# Patient Record
Sex: Female | Born: 1945
Health system: Southern US, Community
[De-identification: ages and names within clinical notes are randomized; demographics above are authoritative.]

## PROBLEM LIST (undated history)

## (undated) DIAGNOSIS — I10 Essential (primary) hypertension: Secondary | ICD-10-CM

## (undated) DIAGNOSIS — T7840XA Allergy, unspecified, initial encounter: Secondary | ICD-10-CM

## (undated) DIAGNOSIS — J449 Chronic obstructive pulmonary disease, unspecified: Secondary | ICD-10-CM

## (undated) DIAGNOSIS — R011 Cardiac murmur, unspecified: Secondary | ICD-10-CM

## (undated) HISTORY — DX: Chronic obstructive pulmonary disease, unspecified: J44.9

## (undated) HISTORY — DX: Cardiac murmur, unspecified: R01.1

## (undated) HISTORY — PX: UPPER GASTROINTESTINAL ENDOSCOPY: SHX188

## (undated) HISTORY — DX: Allergy, unspecified, initial encounter: T78.40XA

---

## 2000-06-10 ENCOUNTER — Emergency Department (HOSPITAL_COMMUNITY): Admission: EM | Admit: 2000-06-10 | Discharge: 2000-06-10 | Payer: Self-pay | Admitting: Emergency Medicine

## 2002-03-05 ENCOUNTER — Emergency Department (HOSPITAL_COMMUNITY): Admission: EM | Admit: 2002-03-05 | Discharge: 2002-03-05 | Payer: Self-pay | Admitting: Emergency Medicine

## 2005-07-09 ENCOUNTER — Emergency Department (HOSPITAL_COMMUNITY): Admission: EM | Admit: 2005-07-09 | Discharge: 2005-07-09 | Payer: Self-pay | Admitting: Emergency Medicine

## 2005-10-29 ENCOUNTER — Ambulatory Visit: Payer: Self-pay | Admitting: *Deleted

## 2005-10-29 ENCOUNTER — Ambulatory Visit: Payer: Self-pay | Admitting: Family Medicine

## 2011-04-19 ENCOUNTER — Encounter: Payer: Self-pay | Admitting: *Deleted

## 2011-04-19 ENCOUNTER — Emergency Department (HOSPITAL_COMMUNITY)
Admission: EM | Admit: 2011-04-19 | Discharge: 2011-04-19 | Disposition: A | Discharge status: Home / Self Care | Payer: Self-pay | Attending: Emergency Medicine | Admitting: Emergency Medicine

## 2011-04-19 ENCOUNTER — Emergency Department (HOSPITAL_COMMUNITY): Payer: Self-pay

## 2011-04-19 DIAGNOSIS — R059 Cough, unspecified: Secondary | ICD-10-CM | POA: Insufficient documentation

## 2011-04-19 DIAGNOSIS — IMO0001 Reserved for inherently not codable concepts without codable children: Secondary | ICD-10-CM | POA: Insufficient documentation

## 2011-04-19 DIAGNOSIS — B9789 Other viral agents as the cause of diseases classified elsewhere: Secondary | ICD-10-CM | POA: Insufficient documentation

## 2011-04-19 DIAGNOSIS — R05 Cough: Secondary | ICD-10-CM | POA: Insufficient documentation

## 2011-04-19 DIAGNOSIS — R220 Localized swelling, mass and lump, head: Secondary | ICD-10-CM | POA: Insufficient documentation

## 2011-04-19 DIAGNOSIS — J3489 Other specified disorders of nose and nasal sinuses: Secondary | ICD-10-CM | POA: Insufficient documentation

## 2011-04-19 DIAGNOSIS — J988 Other specified respiratory disorders: Secondary | ICD-10-CM

## 2011-04-19 DIAGNOSIS — R093 Abnormal sputum: Secondary | ICD-10-CM | POA: Insufficient documentation

## 2011-04-19 HISTORY — DX: Essential (primary) hypertension: I10

## 2011-04-19 MED ORDER — GUAIFENESIN 100 MG/5ML PO LIQD: 100.0000 mg | ORAL | Status: AC | PRN

## 2011-04-19 MED ORDER — SODIUM CHLORIDE 0.9 % IV BOLUS (SEPSIS)
1000.0000 mL | Freq: Once | INTRAVENOUS | Status: AC
  Administered 2011-04-19: 1000 mL via INTRAVENOUS

## 2011-04-19 MED ORDER — ONDANSETRON HCL 4 MG PO TABS: 4.0000 mg | ORAL_TABLET | Freq: Three times a day (TID) | ORAL | Status: AC | PRN

## 2011-04-19 MED ORDER — ONDANSETRON HCL 4 MG/2ML IJ SOLN
4.0000 mg | Freq: Once | INTRAMUSCULAR | Status: AC
  Administered 2011-04-19: 4 mg via INTRAVENOUS
  Filled 2011-04-19: qty 2

## 2011-04-19 MED ORDER — ACETAMINOPHEN 325 MG PO TABS
650.0000 mg | ORAL_TABLET | Freq: Once | ORAL | Status: AC
  Administered 2011-04-19: 650 mg via ORAL
  Filled 2011-04-19: qty 2

## 2011-04-19 NOTE — ED Notes (Signed)
Pt reports cold like symptoms x 2 days.  Coughing up yellow sputum.  Denies sore throat.  Denies fever.  Ambulatory.  No distress noted.

## 2011-04-19 NOTE — ED Provider Notes (Signed)
History     CSN: [id number]  Arrival date & time 04/19/11  6578   First MD Initiated Contact with Patient 04/19/11 [phone]      Chief Complaint  Patient presents with  . Nasal Congestion  . Cough    (Consider location/radiation/quality/duration/timing/severity/associated sxs/prior treatment) HPI Comments: Patient reports cough productive of yellow sputum, nasal congestion, body aches, nausea, decreased appetite that began two days ago.  Pt states she has not been eating but did drink a little bit yesterday.  Notes mild SOB.  Has been taking OTC cold medication with some relief.  Denies fevers, sore throat, abdominal or chest pain.   Patient is a 65 y.o. female presenting with cough. The history is provided by the patient.  Cough The current episode started 2 days ago. There has been no fever. Associated symptoms include myalgias. Pertinent negatives include no chest pain and no sore throat. She is a smoker.    Past Medical History  Diagnosis Date  . Hypertension     History reviewed. No pertinent past surgical history.  History reviewed. No pertinent family history.  History  Substance Use Topics  . Smoking status: Current Everyday Smoker -- 1.0 packs/day for 40 years  . Smokeless tobacco: Not on file  . Alcohol Use: No    OB History    Grav Para Term Preterm Abortions TAB SAB Ect Mult Living                  Review of Systems  HENT: Negative for sore throat.   Respiratory: Positive for cough.   Cardiovascular: Negative for chest pain.  Musculoskeletal: Positive for myalgias.  All other systems reviewed and are negative.    Allergies  Review of patient's allergies indicates no known allergies.  Home Medications  No current outpatient prescriptions on file.  BP 147/104  Pulse 118  Temp(Src) 97.7 F (36.5 C) (Oral)  Resp 20  SpO2 94%  Physical Exam  Nursing note and vitals reviewed. Constitutional: She is oriented to person, place, and time. She  appears well-developed and well-nourished.  HENT:  Head: Normocephalic and atraumatic.  Nose: Mucosal edema and rhinorrhea present.  Mouth/Throat: Uvula is midline and mucous membranes are normal. Posterior oropharyngeal erythema present. No oropharyngeal exudate, posterior oropharyngeal edema or tonsillar abscesses.  Neck: Neck supple.  Cardiovascular: Normal rate, regular rhythm and normal heart sounds.   Pulmonary/Chest: Effort normal and breath sounds normal. No respiratory distress. She has no wheezes. She has no rales. She exhibits no tenderness.  Abdominal: Soft. Bowel sounds are normal. She exhibits no distension. There is no tenderness. There is no rebound and no guarding.  Neurological: She is alert and oriented to person, place, and time.  Psychiatric: She has a normal mood and affect.    ED Course  Procedures (including critical care time)  7:50 AM Patient reports she is feeling better, states she is hungry and would like to be discharged so she can go to the cafeteria and eat.    Labs Reviewed - No data to display Dg Chest 2 View  04/19/2011  *RADIOLOGY REPORT*  Clinical Data: Cough  CHEST - 2 VIEW  Comparison: None.  Findings: Cardiomegaly.  Calcified tortuous aorta.  Increased perihilar and interstitial markings likely represent viral pneumonitis. Chronic lung disease not excluded however. No lobar consolidation.  No effusion or pneumothorax.  Osseous structures unremarkable.  IMPRESSION: Cardiomegaly.  Increased perihilar interstitial markings likely representing viral pneumonitis.  No lobar consolidation.  Original Report Authenticated By:  Elsie Stain, M.D.   Filed Vitals:   04/19/11 0738  BP: 159/106  Pulse: 72  Temp: 97.4 F (36.3 C)  Resp: 18      1. Viral respiratory illness       MDM  Patient with cough and body aches x 2 days, otherwise healthy, CXR normal.  Pt given IVF due to decreased PO intake.  Tachycardia resolved with IVF and tylenol.           Dillard Cannon [place], [place] 04/19/11 [phone]

## 2011-04-19 NOTE — ED Notes (Signed)
Patient transported to X-ray 

## 2011-04-20 NOTE — ED Provider Notes (Signed)
Medical screening examination/treatment/procedure(s) were performed by non-physician practitioner and as supervising physician I was immediately available for consultation/collaboration.  Jasmine Awe, MD 04/20/11 [phone]

## 2011-10-19 ENCOUNTER — Emergency Department (HOSPITAL_COMMUNITY)
Admission: EM | Admit: 2011-10-19 | Discharge: 2011-10-19 | Disposition: A | Discharge status: Home / Self Care | Payer: No Typology Code available for payment source | Attending: Emergency Medicine | Admitting: Emergency Medicine

## 2011-10-19 ENCOUNTER — Encounter (HOSPITAL_COMMUNITY): Payer: Self-pay | Admitting: Emergency Medicine

## 2011-10-19 DIAGNOSIS — F17200 Nicotine dependence, unspecified, uncomplicated: Secondary | ICD-10-CM | POA: Insufficient documentation

## 2011-10-19 DIAGNOSIS — Y92410 Unspecified street and highway as the place of occurrence of the external cause: Secondary | ICD-10-CM | POA: Insufficient documentation

## 2011-10-19 DIAGNOSIS — M25519 Pain in unspecified shoulder: Secondary | ICD-10-CM | POA: Insufficient documentation

## 2011-10-19 DIAGNOSIS — M79609 Pain in unspecified limb: Secondary | ICD-10-CM | POA: Insufficient documentation

## 2011-10-19 DIAGNOSIS — I10 Essential (primary) hypertension: Secondary | ICD-10-CM | POA: Insufficient documentation

## 2011-10-19 DIAGNOSIS — T148XXA Other injury of unspecified body region, initial encounter: Secondary | ICD-10-CM

## 2011-10-19 DIAGNOSIS — M542 Cervicalgia: Secondary | ICD-10-CM | POA: Insufficient documentation

## 2011-10-19 MED ORDER — METHOCARBAMOL 500 MG PO TABS: 500.0000 mg | ORAL_TABLET | Freq: Two times a day (BID) | ORAL | Status: DC

## 2011-10-19 MED ORDER — IBUPROFEN 800 MG PO TABS: 800.0000 mg | ORAL_TABLET | Freq: Three times a day (TID) | ORAL | Status: DC

## 2011-10-19 MED ORDER — HYDROCODONE-ACETAMINOPHEN 5-325 MG PO TABS: 1.0000 | ORAL_TABLET | Freq: Four times a day (QID) | ORAL | Status: DC | PRN

## 2011-10-19 NOTE — ED Notes (Signed)
Pt verbalizes understanding 
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
## 2011-10-19 NOTE — ED Notes (Signed)
Pt denies loc, denies injury to head
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
## 2011-10-21 NOTE — ED Provider Notes (Signed)
Medical screening examination/treatment/procedure(s) were performed by non-physician practitioner and as supervising physician I was immediately available for consultation/collaboration.  Flint Melter, MD 10/21/11 [phone]

## 2011-10-22 ENCOUNTER — Encounter (HOSPITAL_COMMUNITY): Payer: Self-pay

## 2011-10-22 ENCOUNTER — Emergency Department (HOSPITAL_COMMUNITY)
Admission: EM | Admit: 2011-10-22 | Discharge: 2011-10-22 | Disposition: A | Discharge status: Home / Self Care | Payer: No Typology Code available for payment source | Attending: Emergency Medicine | Admitting: Emergency Medicine

## 2011-10-22 DIAGNOSIS — F17200 Nicotine dependence, unspecified, uncomplicated: Secondary | ICD-10-CM | POA: Insufficient documentation

## 2011-10-22 DIAGNOSIS — I10 Essential (primary) hypertension: Secondary | ICD-10-CM

## 2011-10-22 LAB — BASIC METABOLIC PANEL WITH GFR
BUN: 9 mg/dL (ref 6–23)
CO2: 22 meq/L (ref 19–32)
Calcium: 9.6 mg/dL (ref 8.4–10.5)
Chloride: 102 meq/L (ref 96–112)
Creatinine, Ser: 0.73 mg/dL (ref 0.50–1.10)
GFR calc Af Amer: >90 mL/min
GFR calc non Af Amer: 89 mL/min — ABNORMAL LOW
Glucose, Bld: 97 mg/dL (ref 70–99)
Potassium: 4.1 meq/L (ref 3.5–5.1)
Sodium: 135 meq/L (ref 135–145)

## 2011-10-22 MED ORDER — CLONIDINE HCL 0.1 MG PO TABS
0.2000 mg | ORAL_TABLET | Freq: Once | ORAL | Status: AC
  Administered 2011-10-22: 0.2 mg via ORAL
  Filled 2011-10-22: qty 2

## 2011-10-22 MED ORDER — AMLODIPINE BESYLATE 10 MG PO TABS: 5.0000 mg | ORAL_TABLET | Freq: Every day | ORAL | Status: DC

## 2011-10-22 NOTE — ED Provider Notes (Addendum)
History     CSN: [id number]  Arrival date & time 10/22/11  1628   First MD Initiated Contact with Patient 10/22/11 2045      Chief Complaint  Patient presents with  . Hypertension    (Consider location/radiation/quality/duration/timing/severity/associated sxs/prior treatment) Patient is a 66 y.o. female presenting with hypertension. The history is provided by the patient.  Hypertension  pt here due to htn dx at the chiropractors office today--pt has been asymptomatic and has no prior h/o htn--denies chest pain, sob, abd pain, dizziness, or syncope--nothing makes her sx better or worse and no meds used pta  Past Medical History  Diagnosis Date  . Hypertension     History reviewed. No pertinent past surgical history.  History reviewed. No pertinent family history.  History  Substance Use Topics  . Smoking status: Current Everyday Smoker -- 1.0 packs/day for 40 years  . Smokeless tobacco: Not on file  . Alcohol Use: No     recovering etoh addict    OB History    Grav Para Term Preterm Abortions TAB SAB Ect Mult Living                  Review of Systems  All other systems reviewed and are negative.    Allergies  Review of patient's allergies indicates no known allergies.  Home Medications   Current Outpatient Rx  Name Route Sig Dispense Refill  . HYDROCODONE-ACETAMINOPHEN 5-325 MG PO TABS Oral Take 1-2 tablets by mouth every 6 (six) hours as needed.    . IBUPROFEN 800 MG PO TABS Oral Take 800 mg by mouth 3 (three) times daily.    Marland Kitchen GERITOL COMPLETE PO TABS Oral Take 1 tablet by mouth daily.    Marland Kitchen METHOCARBAMOL 500 MG PO TABS Oral Take 500 mg by mouth 2 (two) times daily.      BP 211/119  Pulse 76  Temp 98.2 F (36.8 C) (Oral)  Resp 16  SpO2 98%  Physical Exam  Nursing note and vitals reviewed. Constitutional: She is oriented to person, place, and time. She appears well-developed and well-nourished.  Non-toxic appearance. No distress.  HENT:  Head:  Normocephalic and atraumatic.  Eyes: Conjunctivae, EOM and lids are normal. Pupils are equal, round, and reactive to light.  Neck: Normal range of motion. Neck supple. No tracheal deviation present. No mass present.  Cardiovascular: Normal rate, regular rhythm and normal heart sounds.  Exam reveals no gallop.   No murmur heard. Pulmonary/Chest: Effort normal and breath sounds normal. No stridor. No respiratory distress. She has no decreased breath sounds. She has no wheezes. She has no rhonchi. She has no rales.  Abdominal: Soft. Normal appearance and bowel sounds are normal. She exhibits no distension. There is no tenderness. There is no rebound and no CVA tenderness.  Musculoskeletal: Normal range of motion. She exhibits no edema and no tenderness.  Neurological: She is alert and oriented to person, place, and time. She has normal strength. No cranial nerve deficit or sensory deficit. GCS eye subscore is 4. GCS verbal subscore is 5. GCS motor subscore is 6.  Skin: Skin is warm and dry. No abrasion and no rash noted.  Psychiatric: She has a normal mood and affect. Her speech is normal and behavior is normal.    ED Course  Procedures (including critical care time)  Labs Reviewed  BASIC METABOLIC PANEL - Abnormal; Notable for the following:    GFR calc non Af Amer 89 (*)  All other components within normal limits   No results found.   No diagnosis found.    MDM  Patient given clonidine 0.2 mg. Blood pressure was repeated and is now improved. Will place her on Norvasc and give her referrals      Date: 11/18/2011  Rate: 64  Rhythm: normal sinus rhythm  QRS Axis: normal  Intervals: normal  ST/T Wave abnormalities: nonspecific T wave changes  Conduction Disutrbances:none  Narrative Interpretation:   Old EKG Reviewed: none available     Toy Baker, MD 10/22/11 2243  Toy Baker, MD 11/18/11 [phone]
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
## 2011-10-22 NOTE — ED Notes (Signed)
Dr Allen notified about patient's blood pressure. 

## 2011-10-22 NOTE — Discharge Instructions (Signed)
Arterial Hypertension Arterial hypertension (high blood pressure) is a condition of elevated pressure in your blood vessels. Hypertension over a long period of time is a risk factor for strokes, heart attacks, and heart failure. It is also the leading cause of kidney (renal) failure.  CAUSES   In Adults -- Over 90% of all hypertension has no known cause. This is called essential or primary hypertension. In the other 10% of people with hypertension, the increase in blood pressure is caused by another disorder. This is called secondary hypertension. Important causes of secondary hypertension are:   Heavy alcohol use.   Obstructive sleep apnea.   Hyperaldosterosim (Conn's syndrome).   Steroid use.   Chronic kidney failure.   Hyperparathyroidism.   Medications.   Renal artery stenosis.   Pheochromocytoma.   Cushing's disease.   Coarctation of the aorta.   Scleroderma renal crisis.   Licorice (in excessive amounts).   Drugs (cocaine, methamphetamine).  Your caregiver can explain any items above that apply to you.  In Children -- Secondary hypertension is more common and should always be considered.   Pregnancy -- Few women of childbearing age have high blood pressure. However, up to 10% of them develop hypertension of pregnancy. Generally, this will not harm the woman. It Even be a sign of 3 complications of pregnancy: preeclampsia, HELLP syndrome, and eclampsia. Follow up and control with medication is necessary.  SYMPTOMS   This condition normally does not produce any noticeable symptoms. It is usually found during a routine exam.   Malignant hypertension is a late problem of high blood pressure. It Monger have the following symptoms:   Headaches.   Blurred vision.   End-organ damage (this means your kidneys, heart, lungs, and other organs are being damaged).   Stressful situations can increase the blood pressure. If a person with normal blood pressure has their blood  pressure go up while being seen by their caregiver, this is often termed "white coat hypertension." Its importance is not known. It Alkire be related with eventually developing hypertension or complications of hypertension.   Hypertension is often confused with mental tension, stress, and anxiety.  DIAGNOSIS  The diagnosis is made by 3 separate blood pressure measurements. They are taken at least 1 week apart from each other. If there is organ damage from hypertension, the diagnosis Lemire be made without repeat measurements. Hypertension is usually identified by having blood pressure readings:  Above 140/90 mmHg measured in both arms, at 3 separate times, over a couple weeks.   Over 130/80 mmHg should be considered a risk factor and Grisanti require treatment in patients with diabetes.  Blood pressure readings over 120/80 mmHg are called "pre-hypertension" even in non-diabetic patients. To get a true blood pressure measurement, use the following guidelines. Be aware of the factors that can alter blood pressure readings.  Take measurements at least 1 hour after caffeine.   Take measurements 30 minutes after smoking and without any stress. This is another reason to quit smoking - it raises your blood pressure.   Use a proper cuff size. Ask your caregiver if you are not sure about your cuff size.   Most home blood pressure cuffs are automatic. They will measure systolic and diastolic pressures. The systolic pressure is the pressure reading at the start of sounds. Diastolic pressure is the pressure at which the sounds disappear. If you are elderly, measure pressures in multiple postures. Try sitting, lying or standing.   Sit at rest for a minimum of   5 minutes before taking measurements.   You should not be on any medications like decongestants. These are found in many cold medications.   Record your blood pressure readings and review them with your caregiver.  If you have hypertension:  Your caregiver  may do tests to be sure you do not have secondary hypertension (see "causes" above).   Your caregiver may also look for signs of metabolic syndrome. This is also called Syndrome X or Insulin Resistance Syndrome. You may have this syndrome if you have type 2 diabetes, abdominal obesity, and abnormal blood lipids in addition to hypertension.   Your caregiver will take your medical and family history and perform a physical exam.   Diagnostic tests may include blood tests (for glucose, cholesterol, potassium, and kidney function), a urinalysis, or an EKG. Other tests may also be necessary depending on your condition.  PREVENTION  There are important lifestyle issues that you can adopt to reduce your chance of developing hypertension:  Maintain a normal weight.   Limit the amount of salt (sodium) in your diet.   Exercise often.   Limit alcohol intake.   Get enough potassium in your diet. Discuss specific advice with your caregiver.   Follow a DASH diet (dietary approaches to stop hypertension). This diet is rich in fruits, vegetables, and low-fat dairy products, and avoids certain fats.  PROGNOSIS  Essential hypertension cannot be cured. Lifestyle changes and medical treatment can lower blood pressure and reduce complications. The prognosis of secondary hypertension depends on the underlying cause. Many people whose hypertension is controlled with medicine or lifestyle changes can live a normal, healthy life.  RISKS AND COMPLICATIONS  While high blood pressure alone is not an illness, it often requires treatment due to its short- and long-term effects on many organs. Hypertension increases your risk for:  CVAs or strokes (cerebrovascular accident).   Heart failure due to chronically high blood pressure (hypertensive cardiomyopathy).   Heart attack (myocardial infarction).   Damage to the retina (hypertensive retinopathy).   Kidney failure (hypertensive nephropathy).  Your caregiver can  explain list items above that apply to you. Treatment of hypertension can significantly reduce the risk of complications. TREATMENT   For overweight patients, weight loss and regular exercise are recommended. Physical fitness lowers blood pressure.   Mild hypertension is usually treated with diet and exercise. A diet rich in fruits and vegetables, fat-free dairy products, and foods low in fat and salt (sodium) can help lower blood pressure. Decreasing salt intake decreases blood pressure in a 1/3 of people.   Stop smoking if you are a smoker.  The steps above are highly effective in reducing blood pressure. While these actions are easy to suggest, they are difficult to achieve. Most patients with moderate or severe hypertension end up requiring medications to bring their blood pressure down to a normal level. There are several classes of medications for treatment. Blood pressure pills (antihypertensives) will lower blood pressure by their different actions. Lowering the blood pressure by 10 mmHg may decrease the risk of complications by as much as 25%. The goal of treatment is effective blood pressure control. This will reduce your risk for complications. Your caregiver will help you determine the best treatment for you according to your lifestyle. What is excellent treatment for one person, may not be for you. HOME CARE INSTRUCTIONS   Do not smoke.   Follow the lifestyle changes outlined in the "Prevention" section.   If you are on medications, follow the directions   carefully. Blood pressure medications must be taken as prescribed. Skipping doses reduces their benefit. It also puts you at risk for problems.   Follow up with your caregiver, as directed.   If you are asked to monitor your blood pressure at home, follow the guidelines in the "Diagnosis" section above.  SEEK MEDICAL CARE IF:   You think you are having medication side effects.   You have recurrent headaches or lightheadedness.     You have swelling in your ankles.   You have trouble with your vision.  SEEK IMMEDIATE MEDICAL CARE IF:   You have sudden onset of chest pain or pressure, difficulty breathing, or other symptoms of a heart attack.   You have a severe headache.   You have symptoms of a stroke (such as sudden weakness, difficulty speaking, difficulty walking).  MAKE SURE YOU:   Understand these instructions.   Will watch your condition.   Will get help right away if you are not doing well or get worse.  Document Released: 04/15/2005 Document Revised: 04/04/2011 Document Reviewed: 11/13/2006 [facility] Patient Information 2012 [place], [place].RESOURCE GUIDE  Chronic Pain Problems: Contact [facility]  [phone] Patients need to be referred by their primary care doctor.  Insufficient Money for Medicine: Contact United Way:  call "211" or [facility] [phone].  No Primary Care Doctor: - Call Health Connect  [phone] - can help you locate a primary care doctor that  accepts your insurance, provides certain services, etc. - Physician Referral [phone]  Agencies that provide inexpensive medical care: - [facility]  [phone] - [facility]  [phone] - [facility]  [phone] [facility]  [phone] - Planned Parenthood  [phone] [facility]  [phone]  Medicaid-accepting [facility] Providers: - [facility]- [address]  [phone], Mon-Fri 9am-7pm, Sat 9am-1pm - [facility]- [address] [place], [address] [place]  [phone] - [facility]- [address] [place]  [phone] [facility]- [address]  [phone] - Renaye Rakers- [address] [place], [address], [phone]  Only accepts [place] patients after they have their name  applied to their card  Self Pay (no insurance) in  [place]: - Sickle Cell Patients: Dr Willey Blade, [facility]  [address] [place], [phone] - [facility]- [address] [place]  [phone]       [facility] [place]- [address]       -     [facility]- see information above (Speak to [employer] if you do not have insurance)       -  [facility]- [address] [place], [phone]       -  [facility]- [address],  [phone]       -  [facility]- [address], [phone]       -  Dr Julio Sicks-  [address], [address], [place], [phone]       -  [facility]- [address] [phone]       -  [facility]- [address], [phone], also [address] [phone]       -    [facility]- [phone]  [address], [phone], 1st & 3rd Saturday   every month, 10am-1pm  1) Find a Doctor and Pay Out of Pocket Although you won't have to find out who is covered by your insurance plan, it is a good idea to ask around and get recommendations. You will then need to call the office and see if the doctor you have chosen will accept you as a new patient and what types of options they offer for patients who are self-pay. Some doctors offer discounts or will set up payment plans for their patients who do not have insurance, but you will need to ask so you aren't surprised when you get to your appointment.  2) Contact Your Local Health Department Not all health departments have doctors that can see patients for sick visits, but many do, so it is worth a call to see if yours does. If you don't know where your local health department is, you can check in your phone book. The CDC also has a tool to help you locate your state's health department, and many state websites also have listings of all of their local health departments.  3) Find a Walk-in Clinic If your illness is not likely to be very severe or  complicated, you may want to try a walk in clinic. These are popping up all over the country in pharmacies, drugstores, and shopping centers. They're usually staffed by nurse practitioners or physician assistants that have been trained to treat common illnesses and complaints. They're usually fairly quick and inexpensive. However, if you have serious medical issues or chronic medical problems, these are probably not your best option  STD Testing - [facility] Department of [place], STD Clinic, [address] [place], phone [phone] or [phone].  Monday - Friday, call for an appointment. [facility] Department of [employer], STD Clinic, [address], [place], phone [phone] or [phone].  Monday - Friday, call for an appointment.  Abuse/Neglect: [facility] Child Abuse Hotline 425-727-6341 [facility] Child Abuse Hotline (320) 532-9702 (After Hours)  Emergency Shelter:  [facility] (757)644-0374  Maternity Homes: - Room at the [place] [facility] [phone] - [facility] [phone]  MRSA Hotline #:   351-325-4539  [facility] Resources  [facility] [place]  United Way [facility] Dept. [address]                 [address]         [address] [place] [address]  Blondell Reveal Phone:  [phone]                                  Phone:  [phone]                   Phone:  [phone]  [facility], [phone] - [facility] - [phone]       -  [facility] in [place], [address]                                  (747)019-3609, [facility] Child Abuse Hotline 626-045-7552 or [phone] (After Hours)   Behavioral Health Services  Substance  Abuse Resources: - Alcohol and Drug Services  ([phone] - [facility] 747-034-4019 - [facility] ([phone] Floydene Flock [phone] - Residential & Outpatient Substance Abuse Program  [phone]  Psychological Services: [facility]  [phone] [facility]  [phone] - [employer], [phone] [place]. [address] [place], ACCESS LINE: 765-456-1176 or (430)[phone], [employer]  Dental Assistance  If unable to pay or uninsured, contact:  [facility] or [facility]. to become qualified for the adult dental clinic.  Patients with Medicaid: [facility] [id number] [address], [phone] [address] [phone]  If unable to pay, or uninsured, contact [facility] [phone]) or [facility] Department [phone] in [place], [phone] in [facility]) to become qualified for the adult dental clinic  Other Low-Cost Community Dental Services: - [facility]- [address] [place], [place], [place], [address], [phone], [address]. [address], 2nd and 4th Thursday of the month at 6:30am.  10 clients each day by appointment, can sometimes see walk-in patients if someone does not show for an appointment. [facility]- [address] [place], [place], [address], [phone] - [facility]- [address], [place], [place], [address], [phone] - [facility]- [phone] [facility] Department- [phone] [facility] Department- [phone]

## 2012-03-13 ENCOUNTER — Encounter (HOSPITAL_COMMUNITY): Payer: Self-pay | Admitting: *Deleted

## 2012-03-13 ENCOUNTER — Emergency Department (HOSPITAL_COMMUNITY)
Admission: EM | Admit: 2012-03-13 | Discharge: 2012-03-13 | Disposition: A | Discharge status: Home / Self Care | Payer: No Typology Code available for payment source | Attending: Emergency Medicine | Admitting: Emergency Medicine

## 2012-03-13 DIAGNOSIS — Z79899 Other long term (current) drug therapy: Secondary | ICD-10-CM | POA: Insufficient documentation

## 2012-03-13 DIAGNOSIS — Z87828 Personal history of other (healed) physical injury and trauma: Secondary | ICD-10-CM | POA: Insufficient documentation

## 2012-03-13 DIAGNOSIS — I10 Essential (primary) hypertension: Secondary | ICD-10-CM | POA: Insufficient documentation

## 2012-03-13 DIAGNOSIS — F17200 Nicotine dependence, unspecified, uncomplicated: Secondary | ICD-10-CM | POA: Insufficient documentation

## 2012-03-13 DIAGNOSIS — R51 Headache: Secondary | ICD-10-CM | POA: Insufficient documentation

## 2012-03-13 MED ORDER — AMLODIPINE BESYLATE 10 MG PO TABS: 5.0000 mg | ORAL_TABLET | Freq: Every day | ORAL | Status: DC

## 2012-03-13 NOTE — ED Provider Notes (Signed)
I saw and evaluated the patient, reviewed the resident's note and I agree with the findings and plan.    No HA now, BP is high, but no evidence of end organ failure.  Needs PCP follow up and continued management as outpt.  Pt is counseled, started back on norvasc which is what she was put on by EDP in June.  Pt is agreeable.     Gavin Pound. Oletta Lamas, MD 03/13/12 2153

## 2012-03-13 NOTE — ED Notes (Signed)
Pt sts her bp is high and her head aches

## 2012-03-13 NOTE — ED Notes (Signed)
Patient states that B/P was high at home. At this time is stating that she does not have a headache, NAD
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
## 2013-11-17 ENCOUNTER — Emergency Department (INDEPENDENT_AMBULATORY_CARE_PROVIDER_SITE_OTHER)
Admission: EM | Admit: 2013-11-17 | Discharge: 2013-11-17 | Disposition: A | Discharge status: Home / Self Care | Payer: Medicare Other | Source: Home / Self Care | Attending: Family Medicine | Admitting: Family Medicine

## 2013-11-17 ENCOUNTER — Encounter (HOSPITAL_COMMUNITY): Payer: Self-pay | Admitting: Emergency Medicine

## 2013-11-17 DIAGNOSIS — I10 Essential (primary) hypertension: Secondary | ICD-10-CM | POA: Diagnosis not present

## 2013-11-17 LAB — POCT I-STAT, CHEM 8
BUN: 9 mg/dL (ref 6–23)
CALCIUM ION: 1.19 mmol/L (ref 1.13–1.30)
Chloride: 105 mEq/L (ref 96–112)
Creatinine, Ser: 1 mg/dL (ref 0.50–1.10)
GLUCOSE: 107 mg/dL — AB (ref 70–99)
HEMATOCRIT: 39 % (ref 36.0–46.0)
HEMOGLOBIN: 13.3 g/dL (ref 12.0–15.0)
Potassium: 3.1 mEq/L — ABNORMAL LOW (ref 3.7–5.3)
Sodium: 141 mEq/L (ref 137–147)
TCO2: 26 mmol/L (ref 0–100)

## 2013-11-17 MED ORDER — AMLODIPINE BESYLATE 10 MG PO TABS: 10.0000 mg | ORAL_TABLET | Freq: Every day | ORAL | Status: DC

## 2013-11-17 NOTE — Discharge Instructions (Signed)
Thank you for coming in today. Take amlodipine daily.  Follow up with a doctor ASAP.  Call or go to the emergency room if you get worse, have trouble breathing, have chest pains, or palpitations.   PRIMARY CARE Paramedic at [place], [place] [phone]  Fax [phone]  Therapist, music at [facility] [address] [place], [place] [phone]  Fax [phone]  Therapist, music at [place] / [place] [phone], [place] [phone]  Fax [phone]  [facility] at [facility] [address], [place]  [place], [place] [phone]  Fax [phone]  [place] [address] [place] [address] [place], [place] [phone]  Fax [phone]  [facility] at [facility] [place], [place] [phone]  Fax [phone]   [place] @ [place] [address] Phone: [phone]   [place] @ [facility]. [place] [address] Phone: [place] @ [place] [address] [place] [address] Phone: [place] @ [place]. [place] [address] Phone: [place] @ [place]. [place], [place] [address] Phone: [phone]   [place] @ [place] [address] [place] [address] Phone: [phone]   Hypertension Hypertension, commonly called high blood pressure, is when the force of blood pumping through your arteries is too strong. Your arteries are the blood vessels that carry blood from your heart throughout your body. A blood pressure reading consists of a higher number over  a lower number, such as 110/72. The higher number (systolic) is the pressure inside your arteries when your heart pumps. The lower number (diastolic) is the pressure inside your arteries when your heart relaxes. Ideally you want your blood pressure below 120/80. Hypertension forces your heart to work harder to pump blood. Your arteries may become narrow or stiff. Having hypertension puts you at risk for heart disease, stroke, and other problems.  RISK FACTORS Some risk factors for high blood pressure are controllable. Others are not.  Risk factors you cannot control include:   Race. You may be at higher risk if you are African American.  Age. Risk increases with age.  Gender. Men are at higher risk than women before age 41 years. After age 65, women are at higher risk than men. Risk factors you can control include:  Not getting enough exercise or physical activity.  Being overweight.  Getting too much fat, sugar, calories, or salt in your diet.  Drinking too much alcohol. SIGNS AND SYMPTOMS Hypertension does not usually cause signs or symptoms. Extremely high blood pressure (hypertensive crisis) may cause headache, anxiety, shortness of breath, and nosebleed. DIAGNOSIS  To check if you have hypertension, your health care provider will measure your blood pressure while you are seated, with your arm held at the level of your heart. It should be measured at least twice using the same arm. Certain conditions can cause a difference in blood pressure between your right and left arms. A blood pressure reading that is higher than normal on one occasion does not mean that you need treatment.  If one blood pressure reading is high, ask your health care provider about having it checked again. TREATMENT  Treating high blood pressure includes making lifestyle changes and possibly taking medication. Living a healthy lifestyle can help lower high blood pressure. You may need to change some of your  habits. Lifestyle changes may include:  Following the DASH diet. This diet is high in fruits, vegetables, and whole grains. It is low in salt, red meat, and added sugars.  Getting at least 2 1/2 hours of brisk physical activity every week.  Losing weight if necessary.  Not smoking.  Limiting alcoholic beverages.  Learning ways to reduce stress. If lifestyle changes are not enough to get your blood pressure under control, your health care provider may prescribe medicine. You may need to take more than one. Work closely with your health care provider to understand the risks and benefits. HOME CARE INSTRUCTIONS  Have your blood pressure rechecked as directed by your health care provider.   Only take medicine as directed by your health care provider. Follow the directions carefully. Blood pressure medicines must be taken as prescribed. The medicine does not work as well when you skip doses. Skipping doses also puts you at risk for problems.   Do not smoke.   Monitor your blood pressure at home as directed by your health care provider. SEEK MEDICAL CARE IF:   You think you are having a reaction to medicines taken.  You have recurrent headaches or feel dizzy.  You have swelling in your ankles.  You have trouble with your vision. SEEK IMMEDIATE MEDICAL CARE IF:  You develop a severe headache or confusion.  You have unusual weakness, numbness, or feel faint.  You have severe chest or abdominal pain.  You vomit repeatedly.  You have trouble breathing. MAKE SURE YOU:   Understand these instructions.  Will watch your condition.  Will get help right away if you are not doing well or get worse. Document Released: 04/15/2005 Document Revised: 04/20/2013 Document Reviewed: 02/05/2013 [facility] Patient Information 2015 [place], [place]. This information is not intended to replace advice given to you by your health care provider. Make sure you discuss any questions you have with  your health care provider.

## 2013-11-17 NOTE — ED Provider Notes (Signed)
Virginia Watson is a 68 y.o. female who presents to Urgent Care today for hypertension. Patient has been out of her blood pressure medication for the last 4-5 months. She feels well and would like to restart her medication. She notes she's had several deaths in the family recently and is very stressed out. Multiple family members have told her that she should restart her blood pressure medication. She feels well otherwise. No chest pains palpitations or shortness of breath. Patient previously was tolerating amlodipine daily.   Past Medical History  Diagnosis Date  . Hypertension    History  Substance Use Topics  . Smoking status: Current Every Day Smoker -- 1.00 packs/day for 40 years  . Smokeless tobacco: Not on file  . Alcohol Use: No     Comment: recovering etoh addict   ROS as above Medications: No current facility-administered medications for this encounter.   Current Outpatient Prescriptions  Medication Sig Dispense Refill  . amLODipine (NORVASC) 10 MG tablet Take 1 tablet (10 mg total) by mouth daily.  30 tablet  0  . Multiple Vitamin (MULTIVITAMIN WITH MINERALS) TABS Take 1 tablet by mouth daily. Geritol Complete      . OVER THE COUNTER MEDICATION Place 1 drop into both eyes daily as needed. Bausch & lomb eye drops for allergies        Exam:  BP 187/109  Pulse 78  Temp(Src) 98.1 F (36.7 C) (Oral)  Resp 14  SpO2 97% Gen: Well NAD HEENT: EOMI,  MMM Lungs: Normal work of breathing. CTABL Heart: RRR no MRG Abd: NABS, Soft. Nondistended, Nontender Exts: Brisk capillary refill, warm and well perfused.   Results for orders placed during the hospital encounter of 11/17/13 (from the past 24 hour(s))  POCT I-STAT, CHEM 8     Status: Abnormal   Collection Time    11/17/13  1:44 PM      Result Value Ref Range   Sodium 141  137 - 147 mEq/L   Potassium 3.1 (*) 3.7 - 5.3 mEq/L   Chloride 105  96 - 112 mEq/L   BUN 9  6 - 23 mg/dL   Creatinine, Ser 1.00  0.50 - 1.10 mg/dL   Glucose, Bld 107 (*) 70 - 99 mg/dL   Calcium, Ion 1.19  1.13 - 1.30 mmol/L   TCO2 26  0 - 100 mmol/L   Hemoglobin 13.3  12.0 - 15.0 g/dL   HCT 39.0  36.0 - 46.0 %   No results found.  Assessment and Plan: 68 y.o. female with hypertension. Restart amlodipine. Followup with primary care provider.  Discussed warning signs or symptoms. Please see discharge instructions. Patient expresses understanding.   This note was created using Systems analyst. Any transcription errors are unintended.    Gregor Hams, MD 11/17/13 [phone]

## 2013-11-17 NOTE — ED Notes (Signed)
C/o blood pressure is high States she has been out of meds for 4-5 months No pcp

## 2016-06-05 ENCOUNTER — Encounter (HOSPITAL_COMMUNITY): Payer: Self-pay

## 2016-06-05 ENCOUNTER — Emergency Department (HOSPITAL_COMMUNITY)
Admission: EM | Admit: 2016-06-05 | Discharge: 2016-06-05 | Disposition: A | Discharge status: Home / Self Care | Payer: Commercial Managed Care - HMO | Attending: Emergency Medicine | Admitting: Emergency Medicine

## 2016-06-05 ENCOUNTER — Emergency Department (HOSPITAL_COMMUNITY): Payer: Commercial Managed Care - HMO

## 2016-06-05 DIAGNOSIS — Z79899 Other long term (current) drug therapy: Secondary | ICD-10-CM | POA: Insufficient documentation

## 2016-06-05 DIAGNOSIS — J441 Chronic obstructive pulmonary disease with (acute) exacerbation: Secondary | ICD-10-CM | POA: Diagnosis not present

## 2016-06-05 DIAGNOSIS — R05 Cough: Secondary | ICD-10-CM | POA: Diagnosis present

## 2016-06-05 DIAGNOSIS — F17200 Nicotine dependence, unspecified, uncomplicated: Secondary | ICD-10-CM | POA: Insufficient documentation

## 2016-06-05 DIAGNOSIS — Z77120 Contact with and (suspected) exposure to mold (toxic): Secondary | ICD-10-CM | POA: Diagnosis not present

## 2016-06-05 DIAGNOSIS — I10 Essential (primary) hypertension: Secondary | ICD-10-CM | POA: Insufficient documentation

## 2016-06-05 MED ORDER — AEROCHAMBER PLUS FLO-VU MEDIUM MISC
1.0000 | Freq: Once | Status: AC
  Administered 2016-06-05: 1

## 2016-06-05 MED ORDER — PREDNISONE 20 MG PO TABS: 60.0000 mg | ORAL_TABLET | Freq: Every day | ORAL | 0 refills | Status: AC

## 2016-06-05 MED ORDER — ALBUTEROL SULFATE HFA 108 (90 BASE) MCG/ACT IN AERS
4.0000 | INHALATION_SPRAY | Freq: Once | RESPIRATORY_TRACT | Status: AC
  Administered 2016-06-05: 4 via RESPIRATORY_TRACT
  Filled 2016-06-05: qty 6.7

## 2016-06-05 MED ORDER — AZITHROMYCIN 250 MG PO TABS: 250.0000 mg | ORAL_TABLET | Freq: Every day | ORAL | 0 refills | Status: DC

## 2016-06-05 MED ORDER — PREDNISONE 20 MG PO TABS
60.0000 mg | ORAL_TABLET | Freq: Once | ORAL | Status: AC
  Administered 2016-06-05: 60 mg via ORAL
  Filled 2016-06-05: qty 3

## 2016-06-05 NOTE — Discharge Instructions (Signed)
Try to decrease the amount of Sudafed you are taking, as it is not good for your blood pressure  Try Corcidin HBP (High Blood Pressure) for your symptoms

## 2016-06-05 NOTE — ED Triage Notes (Signed)
Pt endorses "I think there is mold in my house and I have been exposed to it. I physically removed myself from the house" Pt endorses coughing x 2-3 weeks. Pt hypertensive in triage and does not take BP meds.

## 2016-06-05 NOTE — ED Provider Notes (Signed)
[facility] DEPT Provider Note   CSN: [id number] Arrival date & time: 06/05/16  [id number]   By signing my name below, I, [name], attest that this documentation has been prepared under the direction and in the presence of Duffy Bruce, MD. Electronically Signed: [name], ED Scribe. 06/05/16. 12:53 PM.  History   Chief Complaint Chief Complaint  Patient presents with  . Allergies    HPI Virginia Watson is a 71 y.o. female with a PMHx of HTN, who presents to the Emergency Department complaining of allergies onset 2-3 weeks ago. Pt reports associated productive cough x white sputum for the past 2-3 weeks. Pt hasn't tried any medications for the relief of her symptoms. Pt states that she is concerned for probable exposure to mold due to being informed by her landlord that there was a possibility of mold in her apartment. Pt reports that she has never been exposed to mold in the past. She denies SOB, CP, fever, nausea, vomiting, lip swelling, difficulty swallowing, and any other symptoms. Pt states that she smokes 1 PPD of cigarettes. Pt denies seeing a pulmonologist or using an albuterol inhaler.   The history is provided by the patient. No language interpreter was used.    Past Medical History:  Diagnosis Date  . Hypertension     There are no active problems to display for this patient.   History reviewed. No pertinent surgical history.  OB History    No data available       Home Medications    Prior to Admission medications   Medication Sig Start Date End Date Taking? Authorizing Provider  amLODipine (NORVASC) 10 MG tablet Take 1 tablet (10 mg total) by mouth daily. 11/17/13   Gregor Hams, MD  azithromycin (ZITHROMAX) 250 MG tablet Take 1 tablet (250 mg total) by mouth daily. Take first 2 tablets together, then 1 every day until finished. 06/05/16   Duffy Bruce, MD  Multiple Vitamin (MULTIVITAMIN WITH MINERALS) TABS Take 1 tablet by mouth daily. Geritol Complete     Historical Provider, MD  OVER THE COUNTER MEDICATION Place 1 drop into both eyes daily as needed. Bausch & lomb eye drops for allergies    Historical Provider, MD  predniSONE (DELTASONE) 20 MG tablet Take 3 tablets (60 mg total) by mouth daily. 06/05/16 06/10/16  Duffy Bruce, MD    Family History History reviewed. No pertinent family history.  Social History Social History  Substance Use Topics  . Smoking status: Current Every Day Smoker    Packs/day: 0.50    Years: 40.00  . Smokeless tobacco: Never Used  . Alcohol use Yes     Comment: recovering etoh addict     Allergies   Patient has no known allergies.   Review of Systems Review of Systems  Constitutional: Positive for fatigue. Negative for chills and fever.  HENT: Negative for congestion, rhinorrhea and sore throat.   Eyes: Negative for visual disturbance.  Respiratory: Positive for cough (productive) and wheezing. Negative for shortness of breath.   Cardiovascular: Negative for chest pain and leg swelling.  Gastrointestinal: Negative for abdominal pain, diarrhea, nausea and vomiting.  Genitourinary: Negative for dysuria, flank pain, vaginal bleeding and vaginal discharge.  Musculoskeletal: Negative for neck pain.  Skin: Negative for rash.  Allergic/Immunologic: Negative for immunocompromised state.  Neurological: Negative for syncope and headaches.  Hematological: Does not bruise/bleed easily.  All other systems reviewed and are negative.    Physical Exam Updated Vital Signs BP (!) 217/110 (BP  Location: Left Arm) Comment: took herself off her bp meds  Pulse 88   Temp 98.7 F (37.1 C) (Oral)   Resp 18   Ht 5' 6.5" (1.689 m)   Wt 140 lb (63.5 kg)   SpO2 95%   BMI 22.26 kg/m   Physical Exam  Constitutional: She is oriented to person, place, and time. She appears well-developed and well-nourished. No distress.  HENT:  Head: Normocephalic and atraumatic.  Eyes: Conjunctivae are normal.  Neck: Neck supple.    Cardiovascular: Normal rate and regular rhythm.   Murmur heard.  Systolic murmur is present with a grade of 1/6  1/6 systolic murmur over right upper sternal border  Pulmonary/Chest: Effort normal. No respiratory distress. She has wheezes.  Nl work of breathing. Mild expiratory wheezes.   Abdominal: She exhibits no distension.  Musculoskeletal: She exhibits no edema.  Neurological: She is alert and oriented to person, place, and time. She exhibits normal muscle tone.  Skin: Skin is warm. Capillary refill takes less than 2 seconds. No rash noted.  Nursing note and vitals reviewed.    ED Treatments / Results  DIAGNOSTIC STUDIES: Oxygen Saturation is 95% on RA, adequate by my interpretation.    COORDINATION OF CARE: 12:50 PM Discussed treatment plan with pt at bedside which includes CXR and pt agreed to plan.  Radiology Dg Chest 2 View  Result Date: 06/05/2016 CLINICAL DATA:  Productive cough.  Wheezing. EXAM: CHEST  2 VIEW COMPARISON:  04/19/2011 FINDINGS: Heart size and pulmonary vascularity are normal. There is tortuosity and calcification of the thoracic aorta. There is slight chronic accentuation of the interstitial markings at both lung bases. No effusions. No bone abnormality. IMPRESSION: Slight chronic interstitial disease at the lung bases. Aortic atherosclerosis. Electronically Signed   By: Lorriane Shire M.D.   On: 06/05/2016 13:58    Procedures Procedures (including critical care time)  Medications Ordered in ED Medications  predniSONE (DELTASONE) tablet 60 mg (60 mg Oral Given 06/05/16 1401)  albuterol (PROVENTIL HFA;VENTOLIN HFA) 108 (90 Base) MCG/ACT inhaler 4 puff (4 puffs Inhalation Given 06/05/16 1401)  AEROCHAMBER PLUS FLO-VU MEDIUM MISC 1 each (1 each Other Given 06/05/16 1402)     Initial Impression / Assessment and Plan / ED Course  I have reviewed the triage vital signs and the nursing notes.  Pertinent imaging results that were available during my care of the  patient were reviewed by me and considered in my medical decision making (see chart for details).    71 yo F with PMHx COPD, smoking here with persistent cough, sputum production, possibly due to mold exposure. No fevers. On arrival, VSS. Exam as above, with diffuse wheezing, but normal WOB and oxygenation. CXR is clear. Suspect mild allergic pulmonitis versus COPD exacerbation. No evidence to suggest invasive aspergillosis. Pt is not diabetic or immune suppressed. Do not suspect PE. No CP, SOB, or signs of ACS or CHF.   Of note, pt also markedly HTN. She has been taking Sudafed for allergic sx. Will advise pt to stop, take Corcidin HBP. Will refer for outpt follow-up. No HA, vision changes, CP, or signs of ACS or HTn urgency or emergency. Will treat with prednisone, azithro, and outpt f/u. Return precautions given.   Final Clinical Impressions(s) / ED Diagnoses   Final diagnoses:  COPD exacerbation (HCC)  Mold exposure    New Prescriptions New Prescriptions   AZITHROMYCIN (ZITHROMAX) 250 MG TABLET    Take 1 tablet (250 mg total) by mouth daily. Take  first 2 tablets together, then 1 every day until finished.   PREDNISONE (DELTASONE) 20 MG TABLET    Take 3 tablets (60 mg total) by mouth daily.     I personally performed the services described in this documentation, which was scribed in my presence. The recorded information has been reviewed and is accurate.     Duffy Bruce, MD 06/05/16 1409

## 2016-06-05 NOTE — ED Notes (Signed)
edp aware of vital signs and says okay to discharge because the patient is asymptomatic and needs to quit taken sudafed, thorough explanation given to patient on stopping sudafed

## 2017-01-10 ENCOUNTER — Emergency Department (HOSPITAL_COMMUNITY)
Admission: EM | Admit: 2017-01-10 | Discharge: 2017-01-10 | Disposition: A | Discharge status: Home / Self Care | Payer: Medicare HMO | Attending: Emergency Medicine | Admitting: Emergency Medicine

## 2017-01-10 ENCOUNTER — Emergency Department (HOSPITAL_COMMUNITY): Payer: Medicare HMO

## 2017-01-10 ENCOUNTER — Encounter (HOSPITAL_COMMUNITY): Payer: Self-pay | Admitting: *Deleted

## 2017-01-10 DIAGNOSIS — Y9389 Activity, other specified: Secondary | ICD-10-CM | POA: Insufficient documentation

## 2017-01-10 DIAGNOSIS — S161XXA Strain of muscle, fascia and tendon at neck level, initial encounter: Secondary | ICD-10-CM | POA: Insufficient documentation

## 2017-01-10 DIAGNOSIS — Y929 Unspecified place or not applicable: Secondary | ICD-10-CM | POA: Insufficient documentation

## 2017-01-10 DIAGNOSIS — Z79899 Other long term (current) drug therapy: Secondary | ICD-10-CM | POA: Diagnosis not present

## 2017-01-10 DIAGNOSIS — Y999 Unspecified external cause status: Secondary | ICD-10-CM | POA: Diagnosis not present

## 2017-01-10 DIAGNOSIS — F17210 Nicotine dependence, cigarettes, uncomplicated: Secondary | ICD-10-CM | POA: Insufficient documentation

## 2017-01-10 DIAGNOSIS — I10 Essential (primary) hypertension: Secondary | ICD-10-CM | POA: Diagnosis not present

## 2017-01-10 DIAGNOSIS — M25512 Pain in left shoulder: Secondary | ICD-10-CM | POA: Diagnosis not present

## 2017-01-10 DIAGNOSIS — S199XXA Unspecified injury of neck, initial encounter: Secondary | ICD-10-CM | POA: Diagnosis present

## 2017-01-10 MED ORDER — BACLOFEN 5 MG PO TABS: 5.0000 mg | ORAL_TABLET | Freq: Three times a day (TID) | ORAL | 0 refills | Status: DC | PRN
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
## 2017-10-13 ENCOUNTER — Encounter (HOSPITAL_COMMUNITY): Payer: Self-pay | Admitting: *Deleted

## 2017-10-13 ENCOUNTER — Observation Stay (HOSPITAL_COMMUNITY)
Admission: EM | Admit: 2017-10-13 | Discharge: 2017-10-14 | Disposition: A | Discharge status: Home / Self Care | Payer: Medicare HMO | Attending: Internal Medicine | Admitting: Internal Medicine

## 2017-10-13 ENCOUNTER — Other Ambulatory Visit: Payer: Self-pay

## 2017-10-13 DIAGNOSIS — Z23 Encounter for immunization: Secondary | ICD-10-CM | POA: Diagnosis not present

## 2017-10-13 DIAGNOSIS — I10 Essential (primary) hypertension: Secondary | ICD-10-CM

## 2017-10-13 DIAGNOSIS — I160 Hypertensive urgency: Secondary | ICD-10-CM | POA: Diagnosis present

## 2017-10-13 DIAGNOSIS — F17200 Nicotine dependence, unspecified, uncomplicated: Secondary | ICD-10-CM | POA: Diagnosis not present

## 2017-10-13 DIAGNOSIS — Z91148 Patient's other noncompliance with medication regimen for other reason: Secondary | ICD-10-CM

## 2017-10-13 DIAGNOSIS — Z9114 Patient's other noncompliance with medication regimen: Secondary | ICD-10-CM | POA: Insufficient documentation

## 2017-10-13 DIAGNOSIS — J309 Allergic rhinitis, unspecified: Secondary | ICD-10-CM

## 2017-10-13 DIAGNOSIS — R040 Epistaxis: Secondary | ICD-10-CM | POA: Diagnosis not present

## 2017-10-13 LAB — BASIC METABOLIC PANEL
Anion gap: 8 (ref 5–15)
BUN: 9 mg/dL (ref 6–20)
CALCIUM: 9.3 mg/dL (ref 8.9–10.3)
CO2: 24 mmol/L (ref 22–32)
CREATININE: 1.1 mg/dL — AB (ref 0.44–1.00)
Chloride: 108 mmol/L (ref 101–111)
GFR calc Af Amer: 58 mL/min — ABNORMAL LOW (ref >60)
GFR, EST NON AFRICAN AMERICAN: 50 mL/min — AB (ref >60)
GLUCOSE: 99 mg/dL (ref 65–99)
POTASSIUM: 3.6 mmol/L (ref 3.5–5.1)
SODIUM: 140 mmol/L (ref 135–145)

## 2017-10-13 LAB — TYPE AND SCREEN
ABO/RH(D): O POS
ANTIBODY SCREEN: NEGATIVE

## 2017-10-13 LAB — CBC
HCT: 37.7 % (ref 36.0–46.0)
Hemoglobin: 12.3 g/dL (ref 12.0–15.0)
MCH: 28.4 pg (ref 26.0–34.0)
MCHC: 32.6 g/dL (ref 30.0–36.0)
MCV: 87.1 fL (ref 78.0–100.0)
PLATELETS: 180 10*3/uL (ref 150–400)
RBC: 4.33 MIL/uL (ref 3.87–5.11)
RDW: 13.6 % (ref 11.5–15.5)
WBC: 5.8 10*3/uL (ref 4.0–10.5)

## 2017-10-13 LAB — ABO/RH: ABO/RH(D): O POS

## 2017-10-13 MED ORDER — ACETAMINOPHEN 325 MG PO TABS: 650.0000 mg | ORAL_TABLET | Freq: Four times a day (QID) | ORAL | Status: DC | PRN

## 2017-10-13 MED ORDER — HYDRALAZINE HCL 20 MG/ML IJ SOLN: 10.0000 mg | INTRAMUSCULAR | Status: DC | PRN

## 2017-10-13 MED ORDER — FENTANYL CITRATE (PF) 100 MCG/2ML IJ SOLN
50.0000 ug | Freq: Once | INTRAMUSCULAR | Status: AC
  Administered 2017-10-13: 50 ug via INTRAVENOUS

## 2017-10-13 MED ORDER — LIDOCAINE HCL (CARDIAC) PF 100 MG/5ML IV SOSY
PREFILLED_SYRINGE | INTRAVENOUS | Status: AC
  Administered 2017-10-13: 19:00:00
  Filled 2017-10-13: qty 5

## 2017-10-13 MED ORDER — SODIUM CHLORIDE 0.9% FLUSH: 3.0000 mL | INTRAVENOUS | Status: DC | PRN

## 2017-10-13 MED ORDER — SENNOSIDES-DOCUSATE SODIUM 8.6-50 MG PO TABS: 1.0000 | ORAL_TABLET | Freq: Every evening | ORAL | Status: DC | PRN

## 2017-10-13 MED ORDER — FENTANYL CITRATE (PF) 100 MCG/2ML IJ SOLN
INTRAMUSCULAR | Status: AC
  Administered 2017-10-13: 50 ug via INTRAVENOUS
  Filled 2017-10-13: qty 2

## 2017-10-13 MED ORDER — OXYMETAZOLINE HCL 0.05 % NA SOLN
1.0000 | Freq: Once | NASAL | Status: AC
  Administered 2017-10-13: 1 via NASAL
  Filled 2017-10-13: qty 15

## 2017-10-13 MED ORDER — SODIUM CHLORIDE 0.9% FLUSH
3.0000 mL | Freq: Two times a day (BID) | INTRAVENOUS | Status: DC
  Administered 2017-10-13: 3 mL via INTRAVENOUS

## 2017-10-13 MED ORDER — ONDANSETRON HCL 4 MG/2ML IJ SOLN: 4.0000 mg | Freq: Four times a day (QID) | INTRAMUSCULAR | Status: DC | PRN

## 2017-10-13 MED ORDER — PNEUMOCOCCAL VAC POLYVALENT 25 MCG/0.5ML IJ INJ
0.5000 mL | INJECTION | INTRAMUSCULAR | Status: AC
Start: 2017-10-14 — End: 2017-10-14
  Administered 2017-10-14: 0.5 mL via INTRAMUSCULAR
  Filled 2017-10-13: qty 0.5

## 2017-10-13 MED ORDER — SODIUM CHLORIDE 0.9 % IV SOLN: 250.0000 mL | INTRAVENOUS | Status: DC | PRN

## 2017-10-13 MED ORDER — HYDROCODONE-ACETAMINOPHEN 5-325 MG PO TABS: 1.0000 | ORAL_TABLET | ORAL | Status: DC | PRN

## 2017-10-13 MED ORDER — BISACODYL 5 MG PO TBEC: 5.0000 mg | DELAYED_RELEASE_TABLET | Freq: Every day | ORAL | Status: DC | PRN

## 2017-10-13 MED ORDER — ONDANSETRON HCL 4 MG PO TABS: 4.0000 mg | ORAL_TABLET | Freq: Four times a day (QID) | ORAL | Status: DC | PRN

## 2017-10-13 MED ORDER — HYDROCHLOROTHIAZIDE 12.5 MG PO CAPS
12.5000 mg | ORAL_CAPSULE | Freq: Every day | ORAL | Status: DC
  Administered 2017-10-13: 12.5 mg via ORAL
  Filled 2017-10-13: qty 1

## 2017-10-13 MED ORDER — AMLODIPINE BESYLATE 5 MG PO TABS
10.0000 mg | ORAL_TABLET | Freq: Once | ORAL | Status: AC
Start: 2017-10-13 — End: 2017-10-13
  Administered 2017-10-13: 10 mg via ORAL
  Filled 2017-10-13: qty 2

## 2017-10-13 MED ORDER — ACETAMINOPHEN 650 MG RE SUPP: 650.0000 mg | Freq: Four times a day (QID) | RECTAL | Status: DC | PRN

## 2017-10-13 MED ORDER — AMLODIPINE BESYLATE 10 MG PO TABS
10.0000 mg | ORAL_TABLET | Freq: Every day | ORAL | Status: DC
  Administered 2017-10-14: 10 mg via ORAL
  Filled 2017-10-13: qty 1

## 2017-10-13 NOTE — ED Notes (Signed)
Pt given turkey sandwich and gingerale. 

## 2017-10-13 NOTE — ED Notes (Signed)
Main lab to add on BMP 

## 2017-10-13 NOTE — H&P (Signed)
History and Physical    Chanette Demo GHW:[id number] DOB: 1945-08-10 DOA: 10/13/2017  PCP: Default, Provider, MD   Patient coming from: Home   Chief Complaint: Nose bleeding   HPI: Virginia Watson is a 72 y.o. female with medical history significant for hypertension now presenting to the emergency department for evaluation of epistaxis.  Patient reports that she no longer has a PCP and has not taken her antihypertensives in several months.  She woke in her usual state and was having an uneventful day until early this afternoon when she began to have bleeding from her left nare.  She reports a steady stream of blood coming from the nare, she went to a fire department where hemostasis was achieved briefly, but bleeding resumed and she now presents to the ED for further evaluation and management.  ED Course: Upon arrival to the ED, patient is found to be hypertensive to 200/120, and with vitals otherwise stable.  EKG features a sinus rhythm.  Chemistry panel is notable for creatinine 1.10 and CBC is unremarkable.  Patient was treated with 10 mg of Norvasc, Afrin, and Rhino Rocket.  ENT was contacted by the ED physician, case was discussed, and mentation was to observe the patient overnight with continued blood pressure control.  BP improved after the Norvasc, patient remains stable, there is no significant bleeding at this time, and she will be observed for ongoing evaluation and management.  Review of Systems:  All other systems reviewed and apart from HPI, are negative.  Past Medical History:  Diagnosis Date  . Hypertension     History reviewed. No pertinent surgical history.   reports that she has been smoking.  She has a 20.00 pack-year smoking history. She has never used smokeless tobacco. She reports that she drinks alcohol. She reports that she does not use drugs.  No Known Allergies  History reviewed. No pertinent family history.   Prior to Admission medications   Medication  Sig Start Date End Date Taking? Authorizing Provider  amLODipine (NORVASC) 10 MG tablet Take 1 tablet (10 mg total) by mouth daily. Patient not taking: Reported on 10/13/2017 11/17/13   Gregor Hams, MD    Physical Exam: Vitals:   10/13/17 1800 10/13/17 1830 10/13/17 1846 10/13/17 2000  BP: (!) 203/126 (!) 179/150  (!) 152/109  Pulse: 84 (!) 128 (!) 106 (!) 105  Resp: 13 (!) 31 20 (!) 22  Temp:      TempSrc:      SpO2: 93% 96% 93% 93%  Weight:          Constitutional: NAD, calm  Eyes: PERTLA, lids and conjunctivae normal ENMT: Mucous membranes are moist. Blood-soaked packing in left nare.   Neck: normal, supple, no masses, no thyromegaly Respiratory: clear to auscultation bilaterally, no wheezing, no crackles. Normal respiratory effort.   Cardiovascular: S1 & S2 heard, regular rate and rhythm. No extremity edema.   Abdomen: No distension, no tenderness, soft. Bowel sounds normal.  Musculoskeletal: no clubbing / cyanosis. No joint deformity upper and lower extremities.   Skin: no significant rashes, lesions, ulcers. Warm, dry, well-perfused. Neurologic: CN 2-12 grossly intact. Sensation intact. Strength 5/5 in all 4 limbs.  Psychiatric: Alert and oriented x 3. Calm, cooperative.     Labs on Admission: I have personally reviewed following labs and imaging studies  CBC: Recent Labs  Lab 10/13/17 1708  WBC 5.8  HGB 12.3  HCT 37.7  MCV 87.1  PLT 789   Basic Metabolic Panel: Recent Labs  Lab 10/13/17 1739  NA 140  K 3.6  CL 108  CO2 24  GLUCOSE 99  BUN 9  CREATININE 1.10*  CALCIUM 9.3   GFR: CrCl cannot be calculated (Unknown ideal weight.). Liver Function Tests: No results for input(s): AST, ALT, ALKPHOS, BILITOT, PROT, ALBUMIN in the last 168 hours. No results for input(s): LIPASE, AMYLASE in the last 168 hours. No results for input(s): AMMONIA in the last 168 hours. Coagulation Profile: No results for input(s): INR, PROTIME in the last 168 hours. Cardiac  Enzymes: No results for input(s): CKTOTAL, CKMB, CKMBINDEX, TROPONINI in the last 168 hours. BNP (last 3 results) No results for input(s): PROBNP in the last 8760 hours. HbA1C: No results for input(s): HGBA1C in the last 72 hours. CBG: No results for input(s): GLUCAP in the last 168 hours. Lipid Profile: No results for input(s): CHOL, HDL, LDLCALC, TRIG, CHOLHDL, LDLDIRECT in the last 72 hours. Thyroid Function Tests: No results for input(s): TSH, T4TOTAL, FREET4, T3FREE, THYROIDAB in the last 72 hours. Anemia Panel: No results for input(s): VITAMINB12, FOLATE, FERRITIN, TIBC, IRON, RETICCTPCT in the last 72 hours. Urine analysis: No results found for: COLORURINE, APPEARANCEUR, LABSPEC, PHURINE, GLUCOSEU, HGBUR, BILIRUBINUR, KETONESUR, PROTEINUR, UROBILINOGEN, NITRITE, LEUKOCYTESUR Sepsis Labs: @LABRCNTIP (procalcitonin:4,lacticidven:4) )No results found for this or any previous visit (from the past 240 hour(s)).   Radiological Exams on Admission: No results found.  EKG: Independently reviewed. Sinus rhythm.   Assessment/Plan   1. Epistaxis  - Presents with significant bleeding from left nare; denies recent trauma  - Packed in ED; ENT contacted by ED physician and recommended observing overnight and continued BP-control  - H&H wnl initially  - Type and screen, continue BP-control as below, repeat CBC in am    2. Hypertension with hypertensive urgency  - BP 200/120 range in ED initially  - Pt reports no longer having PCP and has not taken antihypertensives in several months, previously on Norvasc  - She denies chest pain or headache   - Improved with Norvasc 10 mg in ED, will continue qD  - Start HCTZ, use hydralazine IVP's prn    DVT prophylaxis: SCD's  Code Status: Full  Family Communication: Discussed with patient Consults called: ED physician discussed with ENT Admission status: Observation    Vianne Bulls, MD [facility] Pager [phone]  If 7PM-7AM,  please contact night-coverage [email or web address] Password [facility]  10/13/2017, 8:39 PM

## 2017-10-13 NOTE — ED Provider Notes (Signed)
.  Epistaxis Management Date/Time: 10/13/2017 11:10 PM Performed by: Quintella Reichert, MD Authorized by: Quintella Reichert, MD   Consent:    Consent obtained:  Verbal   Consent given by:  Patient   Risks discussed:  Bleeding, infection, nasal injury and pain   Alternatives discussed:  Alternative treatment Anesthesia (see MAR for exact dosages):    Anesthesia method:  Topical application   Topical anesthetic:  Lidocaine gel Procedure details:    Treatment site:  L anterior   Treatment method:  Merocel sponge   Treatment complexity:  Extensive Post-procedure details:    Assessment:  Bleeding decreased   Patient tolerance of procedure:  Tolerated well, no immediate complications      Quintella Reichert, MD 10/13/17 2311

## 2017-10-13 NOTE — ED Provider Notes (Signed)
[facility] EMERGENCY DEPARTMENT Provider Note   CSN: [id number] Arrival date & time: 10/13/17  1649     History   Chief Complaint Chief Complaint  Patient presents with  . Epistaxis  . Hypertension    HPI Virginia Watson is a 72 y.o. female with a PMHx of HTN, who presents to the ED with complaints of epistaxis that occurred around 3 PM, about 2.5 hours prior to evaluation.  Patient states that around 3 PM she spontaneously had a nosebleed, she is not sure which nostril it came from, but it stopped on its own after about 5 minutes.  About 1.5 hours later around 4:30 PM she was driving home and the nosebleed started again spontaneously, again she is not sure which nostril it came from, but the bleeding was "a steady flow" so she pulled over to the local fire department where they applied pressure to stop the bleeding as well as used an ice pack, and this stopped the bleeding.  She is not sure how long it bled for, but per EMS the fire dept estimated about 50cc blood loss.  She did not try any other treatments aside from holding pressure and using ice pack, which both seemed to help as she arrived here with very minimal dripping from her nostril but no ongoing active epistaxis.  No known aggravating factors and no known specific triggers that cause the bleeding to start.  She denies any recent facial injuries or nose picking.  She states that she has chronic allergies and therefore always has sinus congestion, this is not changed from her baseline, she takes Benadryl which usually helps.  Just before the epistaxis started, she had been around a cleaning solution that made her feel very congested and aggravated her allergies, and shortly after that is when the bleeding started, but she had not blown her nose prior to the bleeding starting.  She denies any recent changes in medications, admits that she has not been on her BP med (Norvasc 10 mg daily) in over 1 year.  She does not  currently have a PCP.  She is not on a blood thinner.  She denies any rhinorrhea, sore throat, cough, or other URI symptoms.  She also denies any recent fevers, chills, CP, SOB, LE swelling, headache, vision changes, lightheadedness, abd pain, N/V/D/C, hematuria, dysuria, myalgias, arthralgias, numbness, tingling, focal weakness, or any other complaints at this time.   The history is provided by the patient and medical records. No language interpreter was used.  Epistaxis    Hypertension  Pertinent negatives include no chest pain, no abdominal pain, no headaches and no shortness of breath.    Past Medical History:  Diagnosis Date  . Hypertension     There are no active problems to display for this patient.   History reviewed. No pertinent surgical history.   OB History   None      Home Medications    Prior to Admission medications   Medication Sig Start Date End Date Taking? Authorizing Provider  amLODipine (NORVASC) 10 MG tablet Take 1 tablet (10 mg total) by mouth daily. 11/17/13   Gregor Hams, MD  azithromycin (ZITHROMAX) 250 MG tablet Take 1 tablet (250 mg total) by mouth daily. Take first 2 tablets together, then 1 every day until finished. 06/05/16   Duffy Bruce, MD  Baclofen 5 MG TABS Take 5 mg by mouth 3 (three) times daily as needed. 01/10/17   McDonald, Maree Erie A, PA-C  Multiple  Vitamin (MULTIVITAMIN WITH MINERALS) TABS Take 1 tablet by mouth daily. Geritol Complete    [provider]  OVER THE COUNTER MEDICATION Place 1 drop into both eyes daily as needed. Bausch & lomb eye drops for allergies    [provider]    Family History No family history on file.  Social History Social History   Tobacco Use  . Smoking status: Current Every Day Smoker    Packs/day: 0.50    Years: 40.00    Pack years: 20.00  . Smokeless tobacco: Never Used  Substance Use Topics  . Alcohol use: Yes    Comment: recovering etoh addict  . Drug use: No      Allergies   Patient has no known allergies.   Review of Systems Review of Systems  Constitutional: Negative for chills and fever.  HENT: Positive for congestion (chronic) and nosebleeds. Negative for rhinorrhea and sore throat.   Eyes: Negative for visual disturbance.  Respiratory: Negative for cough and shortness of breath.   Cardiovascular: Negative for chest pain and leg swelling.  Gastrointestinal: Negative for abdominal pain, constipation, diarrhea, nausea and vomiting.  Genitourinary: Negative for dysuria and hematuria.  Musculoskeletal: Negative for arthralgias and myalgias.  Skin: Negative for color change.  Allergic/Immunologic: Positive for environmental allergies. Negative for immunocompromised state.  Neurological: Negative for weakness, light-headedness, numbness and headaches.  Hematological: Does not bruise/bleed easily.  Psychiatric/Behavioral: Negative for confusion.   All other systems reviewed and are negative for acute change except as noted in the HPI.    Physical Exam Updated Vital Signs BP (!) 187/149 (BP Location: Right Arm)   Pulse 99   Temp 97.6 F (36.4 C) (Oral)   Resp (!) 22   Wt 63.5 kg (140 lb)   SpO2 94%   BMI 22.26 kg/m   Physical Exam  Constitutional: She is oriented to person, place, and time. She appears well-developed and well-nourished.  Non-toxic appearance. No distress.  Afebrile, nontoxic, NAD, HTN in the 180s/140s which is similar to prior visits  HENT:  Head: Normocephalic and atraumatic.  Nose: Mucosal edema present. No nose lacerations or nasal septal hematoma. Epistaxis is observed.  Mouth/Throat: Uvula is midline, oropharynx is clear and moist and mucous membranes are normal. No trismus in the jaw. No uvula swelling.  Nose with mild mucosal turbinate edema and congestion in bilateral nostrils, R nare with very scant amount of bright red blood along the walls of the mucosal surface but doesn't appear to have a source of  epistaxis; L nare with scant amount of bright red blood pooled in the nare which seems to be coming from just behind the turbinates, very slow dripping of blood but no active hemorrhage. Oropharynx with very scant amount of bright red blood streaked on the posterior oropharynx but otherwise no active hemorrhage noted and overall clear and moist, without uvular swelling or deviation, no trismus or drooling, no tonsillar swelling or erythema, no exudates.    Eyes: Conjunctivae and EOM are normal. Right eye exhibits no discharge. Left eye exhibits no discharge.  Neck: Normal range of motion. Neck supple.  Cardiovascular: Normal rate, regular rhythm, normal heart sounds and intact distal pulses. Exam reveals no gallop and no friction rub.  No murmur heard. Pulmonary/Chest: Effort normal and breath sounds normal. No respiratory distress. She has no decreased breath sounds. She has no wheezes. She has no rhonchi. She has no rales.  Abdominal: Soft. Normal appearance and bowel sounds are normal. She exhibits no distension.  There is no tenderness. There is no rigidity, no rebound, no guarding, no CVA tenderness, no tenderness at McBurney's point and negative Murphy's sign.  Musculoskeletal: Normal range of motion.  Neurological: She is alert and oriented to person, place, and time. She has normal strength. No sensory deficit.  Skin: Skin is warm, dry and intact. No rash noted.  Psychiatric: She has a normal mood and affect.  Nursing note and vitals reviewed.    ED Treatments / Results  Labs (all labs ordered are listed, but only abnormal results are displayed) Labs Reviewed  BASIC METABOLIC PANEL - Abnormal; Notable for the following components:      Result Value   Creatinine, Ser 1.10 (*)    GFR calc non Af Amer 50 (*)    GFR calc Af Amer 58 (*)    All other components within normal limits  CBC    EKG EKG Interpretation  Date/Time:  Monday October 13 2017 17:06:27 EDT Ventricular Rate:  97 PR  Interval:    QRS Duration: 75 QT Interval:  387 QTC Calculation: 492 R Axis:   58 Text Interpretation:  Sinus rhythm Left atrial enlargement Baseline wander in leads II, III, aVF Borderline prolonged QT interval Confirmed by Quintella Reichert [phone]) on 10/13/2017 5:37:35 PM   Radiology No results found.  Procedures Procedures (including critical care time)  Medications Ordered in ED Medications  oxymetazoline (AFRIN) 0.05 % nasal spray 1 spray (1 spray Each Nare Given 10/13/17 1731)  amLODipine (NORVASC) tablet 10 mg (10 mg Oral Given 10/13/17 1739)  lidocaine (cardiac) 100 mg/3mL (XYLOCAINE) 100 MG/5ML injection 2% (  Given 10/13/17 1845)  fentaNYL (SUBLIMAZE) injection 50 mcg (50 mcg Intravenous Given 10/13/17 1840)     Initial Impression / Assessment and Plan / ED Course  I have reviewed the triage vital signs and the nursing notes.  Pertinent labs & imaging results that were available during my care of the patient were reviewed by me and considered in my medical decision making (see chart for details).     72 y.o. female here with epistaxis that started about 2.5hrs prior to evaluation, lasted 5 mins then stopped, then recurred about an hour later; went to fire dept who told her to hold pressure and applied ice, which stopped the bleeding. She has chronic allergies and thus chronic sinus congestion, but nothing new/different. States it's been over a year since she took BP meds. On exam, hypertensive in the 180s/140s, similar to prior visits, L nare with a scant amount of bright red blood in the nare and dripping slowly out, but not really actively hemorrhaging, R nare with a scant amount of red blood along the mucosal walls but nothing pooled in the nostril or flowing out from the nostril; scant amount of blood in posterior oropharynx but again not hemorrhaging; both nostrils with edematous turbinates and congestion. I suspect chronic allergic rhinitis combined with HTN caused her  epistaxis today; this has slowed significantly and is nearly completely stopped, likely anterior bleed, doubt posterior bleeding source. EKG without acute ischemic findings. Pt asymptomatic otherwise with regards to HTN, doubt need for emergent work up for this or emergent intervention; will give Afrin and home dose of norvasc, will get CBC to check H/H and get BMP to check renal function as well since she hasn't had labs since 2015, and then reassess. Discussed case with my attending Dr. Ralene Bathe who agrees with plan.   6:43 PM CBC WNL, stable H/H. BMP with marginally elevated Cr  1.1 but otherwise unremarkable. Nursing staff urgently called me into the pt's room stating the nose bleed had returned, I came to evaluate the pt and found her spitting up moderate amounts of bright red blood, no clots passed initially; unable to look into nostril due to bleeding and pt actively heaving due to the blood going down the back of her throat; Dr. Ralene Bathe was called into the room, and she packed the L nostril with lidocaine, gave fentanyl, then proceeded with rapid rhino rocket insertion into the L nostril. During this process, when the lidocaine packing was removed, there were several large clots that came out of her L nostril, but bleeding had slowed down at that point; she spit up a few large clots as well but again it seemed to have slowed down after the lidocaine packing had been placed. Rapid rhino was inserted by Dr. Ralene Bathe, please see her procedure notes. Will continue to monitor to see if the bleeding stops or if we need to consult ENT for further assistance with the nose bleed. Will reassess shortly.   8:03 PM Pt continues to have ~3cm sized clots coming from the posterior oropharynx, I suspect she's still having ongoing bleeding, and that it is most likely posterior rather than anterior. Will consult ENT. Of note, BP 152/109 on recheck.   8:18 PM Dr. Janace Hoard of ENT returning page, states that the choice is hers whether  she wants repacking vs admission for observation and HTN control and to give the rhino rocket we placed time to stop the bleeding; after long discussion between myself and the pt as well as Dr. Janace Hoard, pt opted for admission to see if the packing that's already been placed will achieve adequate hemostasis. Dr. Janace Hoard feels this is the best option currently as well; states that if medical team feels that they need his assistance further then they can consult him again, but he's hopeful that with BP control and more time, her nosebleed may cease. Will proceed with medical admission.   8:29 PM Dr. Myna Hidalgo of [facility] returning page and will admit. Holding orders to be placed by admitting team. Please see their notes for further documentation of care. I appreciate their help with this pleasant pt's care. Pt stable at time of admission.     Final Clinical Impressions(s) / ED Diagnoses   Final diagnoses:  Epistaxis  Essential hypertension  Noncompliance with medication regimen  Allergic rhinitis, unspecified seasonality, unspecified trigger    ED Discharge Orders    [address], [place], [place] 10/13/17 2029    Quintella Reichert, MD 10/15/17 1416

## 2017-10-13 NOTE — ED Triage Notes (Signed)
Pt arrives from home by EMS for a nosebleed which began initially at 3:30pm today and then began bleeding profusely around 16:20.  Pt was driving and she pulled into a fire station where FD helped her stop the bleeding.  There was initially a "steady stream of blood".  EMS estimates blood loss at around 50cc total.  Pt is alert and oriented on arrival.  Pt was hypertensive for ems, she has not been taking her BP meds.  Pt BP for ems was 238/152, no CP, no HA, no sob. Pt has been off BP meds for months.  Pt has minimal bleeding on arrival.

## 2017-10-13 NOTE — ED Notes (Signed)
Pt nose started to bleed very heavily, EDP called to bedside and rhino rocket applied.

## 2017-10-14 DIAGNOSIS — Z9114 Patient's other noncompliance with medication regimen: Secondary | ICD-10-CM

## 2017-10-14 DIAGNOSIS — R040 Epistaxis: Secondary | ICD-10-CM

## 2017-10-14 DIAGNOSIS — I10 Essential (primary) hypertension: Secondary | ICD-10-CM

## 2017-10-14 DIAGNOSIS — I160 Hypertensive urgency: Secondary | ICD-10-CM | POA: Diagnosis not present

## 2017-10-14 LAB — BASIC METABOLIC PANEL
Anion gap: 6 (ref 5–15)
BUN: 11 mg/dL (ref 6–20)
CHLORIDE: 106 mmol/L (ref 101–111)
CO2: 27 mmol/L (ref 22–32)
CREATININE: 1.03 mg/dL — AB (ref 0.44–1.00)
Calcium: 8.8 mg/dL — ABNORMAL LOW (ref 8.9–10.3)
GFR calc Af Amer: >60 mL/min (ref >60)
GFR calc non Af Amer: 54 mL/min — ABNORMAL LOW (ref >60)
Glucose, Bld: 101 mg/dL — ABNORMAL HIGH (ref 65–99)
POTASSIUM: 3.2 mmol/L — AB (ref 3.5–5.1)
SODIUM: 139 mmol/L (ref 135–145)

## 2017-10-14 LAB — CBC
HEMATOCRIT: 32.2 % — AB (ref 36.0–46.0)
Hemoglobin: 10.8 g/dL — ABNORMAL LOW (ref 12.0–15.0)
MCH: 29.3 pg (ref 26.0–34.0)
MCHC: 33.5 g/dL (ref 30.0–36.0)
MCV: 87.3 fL (ref 78.0–100.0)
PLATELETS: 156 10*3/uL (ref 150–400)
RBC: 3.69 MIL/uL — ABNORMAL LOW (ref 3.87–5.11)
RDW: 13.6 % (ref 11.5–15.5)
WBC: 5.3 10*3/uL (ref 4.0–10.5)

## 2017-10-14 LAB — HIV ANTIBODY (ROUTINE TESTING W REFLEX): HIV Screen 4th Generation wRfx: NONREACTIVE

## 2017-10-14 MED ORDER — AMOXICILLIN-POT CLAVULANATE 875-125 MG PO TABS: 1.0000 | ORAL_TABLET | Freq: Two times a day (BID) | ORAL | 0 refills | Status: DC

## 2017-10-14 MED ORDER — AMOXICILLIN-POT CLAVULANATE 875-125 MG PO TABS
1.0000 | ORAL_TABLET | Freq: Two times a day (BID) | ORAL | Status: DC
  Administered 2017-10-14: 1 via ORAL
  Filled 2017-10-14: qty 1

## 2017-10-14 MED ORDER — POTASSIUM CHLORIDE CRYS ER 20 MEQ PO TBCR
40.0000 meq | EXTENDED_RELEASE_TABLET | Freq: Once | ORAL | Status: AC
  Administered 2017-10-14: 40 meq via ORAL
  Filled 2017-10-14: qty 2

## 2017-10-14 MED ORDER — AMLODIPINE BESYLATE 10 MG PO TABS: 10.0000 mg | ORAL_TABLET | Freq: Every day | ORAL | 0 refills | Status: DC

## 2017-10-14 MED ORDER — METOPROLOL TARTRATE 25 MG PO TABS: 25.0000 mg | ORAL_TABLET | Freq: Two times a day (BID) | ORAL | 0 refills | Status: DC

## 2017-10-14 MED ORDER — HYDROCHLOROTHIAZIDE 12.5 MG PO CAPS: 12.5000 mg | ORAL_CAPSULE | Freq: Every day | ORAL | 0 refills | Status: DC

## 2017-10-14 MED ORDER — METOPROLOL TARTRATE 25 MG PO TABS
25.0000 mg | ORAL_TABLET | Freq: Two times a day (BID) | ORAL | Status: DC
  Administered 2017-10-14: 25 mg via ORAL
  Filled 2017-10-14: qty 1

## 2017-10-14 NOTE — Discharge Summary (Signed)
Physician Discharge Summary  [name] DXA:[id number] DOB: 1946-02-18 DOA: 10/13/2017  PCP: Patient, No Pcp Per  Admit date: 10/13/2017 Discharge date: 10/14/2017  Admitted From: home Discharge disposition: home   Recommendations for Outpatient Follow-Up:   1. ENT follow up for packing removal 2. Afrin PRN 3. To establish with PCP for BP management    Discharge Diagnosis:   Principal Problem:   Epistaxis Active Problems:   Hypertension   Hypertensive urgency    Discharge Condition: Improved.  Diet recommendation: Low sodium, heart healthy  Wound care: None.  Code status: Full.   History of Present Illness:   Virginia Watson is a 72 y.o. female with medical history significant for hypertension now presenting to the emergency department for evaluation of epistaxis.  Patient reports that she no longer has a PCP and has not taken her antihypertensives in several months.  She woke in her usual state and was having an uneventful day until early this afternoon when she began to have bleeding from her left nare.  She reports a steady stream of blood coming from the nare, she went to a fire department where hemostasis was achieved briefly, but bleeding resumed and she now presents to the ED for further evaluation and management.     Hospital Course by Problem:   Epistaxis  - Presents with significant bleeding from left nare; denies recent trauma  - Packed in ED; ENT contacted by ED physician and recommended observing overnight and continued BP-control  -hgb dropped some, follow up outpatient -PRN afrin   Hypertension with hypertensive urgency  - BP 200/120 range in ED initially  - Pt reports no longer having PCP and has not taken antihypertensives in several months, previously on Norvasc  - She denies chest pain or headache   - HCTZ, norvasc, metoprolol -to establish with PCP for BP checks and lab follow  up  Hypokalemia -replete       Medical Consultants:   ENT (phone)   Discharge Exam:   Vitals:   10/14/17 1052 10/14/17 1224  BP: 136/90 (!) 143/94  Pulse: 76 64  Resp:  18  Temp:  97.9 F (36.6 C)  SpO2:  94%   Vitals:   10/14/17 0545 10/14/17 0807 10/14/17 1052 10/14/17 1224  BP: (!) 163/96 (!) 162/103 136/90 (!) 143/94  Pulse: 83 82 76 64  Resp: 20 18  18   Temp: 98.3 F (36.8 C) 97.8 F (36.6 C)  97.9 F (36.6 C)  TempSrc: Oral Oral  Oral  SpO2: 93% 95%  94%  Weight:      Height:        General exam: Appears calm and comfortable, packing in left nare    The results of significant diagnostics from this hospitalization (including imaging, microbiology, ancillary and laboratory) are listed below for reference.     Procedures and Diagnostic Studies:   No results found.   Labs:   Basic Metabolic Panel: Recent Labs  Lab 10/13/17 1739 10/14/17 0418  NA 140 139  K 3.6 3.2*  CL 108 106  CO2 24 27  GLUCOSE 99 101*  BUN 9 11  CREATININE 1.10* 1.03*  CALCIUM 9.3 8.8*   GFR Estimated Creatinine Clearance: 48.3 mL/min (A) (by C-G formula based on SCr of 1.03 mg/dL (H)). Liver Function Tests: No results for input(s): AST, ALT, ALKPHOS, BILITOT, PROT, ALBUMIN in the last 168 hours. No results for input(s): LIPASE, AMYLASE in the last 168 hours. No results for input(s): AMMONIA  in the last 168 hours. Coagulation profile No results for input(s): INR, PROTIME in the last 168 hours.  CBC: Recent Labs  Lab 10/13/17 1708 10/14/17 0418  WBC 5.8 5.3  HGB 12.3 10.8*  HCT 37.7 32.2*  MCV 87.1 87.3  PLT 180 156   Cardiac Enzymes: No results for input(s): CKTOTAL, CKMB, CKMBINDEX, TROPONINI in the last 168 hours. BNP: Invalid input(s): POCBNP CBG: No results for input(s): GLUCAP in the last 168 hours. D-Dimer No results for input(s): DDIMER in the last 72 hours. Hgb A1c No results for input(s): HGBA1C in the last 72 hours. Lipid Profile No  results for input(s): CHOL, HDL, LDLCALC, TRIG, CHOLHDL, LDLDIRECT in the last 72 hours. Thyroid function studies No results for input(s): TSH, T4TOTAL, T3FREE, THYROIDAB in the last 72 hours.  Invalid input(s): FREET3 Anemia work up No results for input(s): VITAMINB12, FOLATE, FERRITIN, TIBC, IRON, RETICCTPCT in the last 72 hours. Microbiology No results found for this or any previous visit (from the past 240 hour(s)).   Discharge Instructions:   Discharge Instructions    Diet - low sodium heart healthy   Complete by:  As directed    Discharge instructions   Complete by:  As directed    Can use afrin in future for nosebleeding   Increase activity slowly   Complete by:  As directed      Allergies as of 10/14/2017   No Known Allergies     Medication List    TAKE these medications   amLODipine 10 MG tablet Commonly known as:  NORVASC Take 1 tablet (10 mg total) by mouth daily.   amoxicillin-clavulanate 875-125 MG tablet Commonly known as:  AUGMENTIN Take 1 tablet by mouth every 12 (twelve) hours.   hydrochlorothiazide 12.5 MG capsule Commonly known as:  MICROZIDE Take 1 capsule (12.5 mg total) by mouth at bedtime.   metoprolol tartrate 25 MG tablet Commonly known as:  LOPRESSOR Take 1 tablet (25 mg total) by mouth 2 (two) times daily. Notes to patient:  6/18      Follow-up Information    Billie Ruddy, MD. Go on 10/23/2017.   Specialty:  Family Medicine Why:  appointment at 3:30 to establish a Primary Care Provider. Please arrive early and have insurance cards and a list of home medications (this discahrge paperwork will work well).  Contact information: [place] [address] [phone]        Melissa Montane, MD. Go on 10/20/2017.   Specialty:  Otolaryngology Why:  for follow up with nasal packing removal@1 :40pm Contact information: [address] [place] [address] [phone]            Time coordinating  discharge: 35 min  Signed:  Geradine Girt  [facility] 10/14/2017, 2:14 PM

## 2017-10-14 NOTE — Progress Notes (Signed)
Discharged to home, d/c instructions given to patient, verbalized understanding. PIV removed no s/s of infiltration or swelling noted. Awaiting  For her niece for transport.

## 2017-10-23 ENCOUNTER — Ambulatory Visit (INDEPENDENT_AMBULATORY_CARE_PROVIDER_SITE_OTHER): Payer: Medicare HMO | Admitting: Family Medicine

## 2017-10-23 ENCOUNTER — Encounter: Payer: Self-pay | Admitting: Family Medicine

## 2017-10-23 VITALS — BP 100/80 | HR 74 | Temp 98.9°F | Ht 65.0 in | Wt 143.0 lb

## 2017-10-23 DIAGNOSIS — I10 Essential (primary) hypertension: Secondary | ICD-10-CM | POA: Diagnosis not present

## 2017-10-23 DIAGNOSIS — J302 Other seasonal allergic rhinitis: Secondary | ICD-10-CM | POA: Diagnosis not present

## 2017-10-23 DIAGNOSIS — Z7689 Persons encountering health services in other specified circumstances: Secondary | ICD-10-CM | POA: Diagnosis not present

## 2017-10-23 DIAGNOSIS — F17210 Nicotine dependence, cigarettes, uncomplicated: Secondary | ICD-10-CM | POA: Diagnosis not present

## 2017-10-23 NOTE — Progress Notes (Signed)
Patient presents to clinic today to establish care.  SUBJECTIVE: PMH: pt is a 72 yo female with pmh sig for HTN and seasonal allergies.  Pt did not have a pcp.  Pt recently went to the ED for hypertensive urgency and epistaxis.  She was started on Metoprolol 25 mg bid, HCTZ 12.5 mg, and norvasc 10 mg daily.  Pt has been trying to eat better.    Nicotine use: -pt smoking 1 pack every day and a half -Was smoking 1-1/2 packs/day. -Pt has been smoking since her early teens. -pt is considering quitting.  States after being in the hospital she realized she needs to take better care of herself.  Seasonal allergies: -May take Benadryl or Zyrtec as needed -Does not feel like it is really help. -Also uses saline nasal rinse  Social history: Pt is single.  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  Patient endorses social alcohol use, tobacco use.  Patient denies drug use.  Family medical history: Mom-alcohol abuse, asthma, HLD Dad-alcohol abuse, MI Sister-Patricia, depression, HTN Brother-Robert, alcohol abuse, drug abuse  Past Medical History:  Diagnosis Date  . Allergy   . Hypertension     History reviewed. No pertinent surgical history.  Current Outpatient Medications on File Prior to Visit  Medication Sig Dispense Refill  . amLODipine (NORVASC) 10 MG tablet Take 1 tablet (10 mg total) by mouth daily. 30 tablet 0  . hydrochlorothiazide (MICROZIDE) 12.5 MG capsule Take 1 capsule (12.5 mg total) by mouth at bedtime. 30 capsule 0  . metoprolol tartrate (LOPRESSOR) 25 MG tablet Take 1 tablet (25 mg total) by mouth 2 (two) times daily. 60 tablet 0   No current facility-administered medications on file prior to visit.     No Known Allergies  Family History  Problem Relation Age of Onset  . Alcohol abuse Mother   . Asthma Mother   . Hyperlipidemia Mother   . Alcohol abuse Father   . Heart attack Father   . Depression Sister   .  Hypertension Sister   . Drug abuse Brother   . Alcohol abuse Brother     Social History   Socioeconomic History  . Marital status: Single    Spouse name: Not on file  . Number of children: Not on file  . Years of education: Not on file  . Highest education level: Not on file  Occupational History  . Not on file  Social Needs  . Financial resource strain: Not on file  . Food insecurity:    Worry: Not on file    Inability: Not on file  . Transportation needs:    Medical: Not on file    Non-medical: Not on file  Tobacco Use  . Smoking status: Current Every Day Smoker    Packs/day: 0.50    Years: 40.00    Pack years: 20.00  . Smokeless tobacco: Current User  Substance and Sexual Activity  . Alcohol use: Yes    Comment: recovering etoh addict  . Drug use: No  . Sexual activity: Not on file  Lifestyle  . Physical activity:    Days per week: Not on file    Minutes per session: Not on file  . Stress: Not on file  Relationships  . Social connections:    Talks on phone: Not on file    Gets together: Not on file    Attends religious service: Not on file  Active member of club or organization: Not on file    Attends meetings of clubs or organizations: Not on file    Relationship status: Not on file  . Intimate partner violence:    Fear of current or ex partner: Not on file    Emotionally abused: Not on file    Physically abused: Not on file    Forced sexual activity: Not on file  Other Topics Concern  . Not on file  Social History Narrative  . Not on file    ROS General: Denies fever, chills, night sweats, changes in weight, changes in appetite HEENT: Denies headaches, ear pain, changes in vision, rhinorrhea, sore throat CV: Denies CP, palpitations, SOB, orthopnea Pulm: Denies SOB, cough, wheezing GI: Denies abdominal pain, nausea, vomiting, diarrhea, constipation GU: Denies dysuria, hematuria, frequency, vaginal discharge Msk: Denies muscle cramps, joint  pains Neuro: Denies weakness, numbness, tingling Skin: Denies rashes, bruising Psych: Denies depression, anxiety, hallucinations  BP 100/80 (BP Location: Left Arm, Patient Position: Sitting, Cuff Size: Normal)   Pulse 74   Temp 98.9 F (37.2 C) (Oral)   Ht 5\' 5"  (1.651 m)   Wt 143 lb (64.9 kg)   SpO2 93%   BMI 23.80 kg/m   Physical Exam Gen. Pleasant, well developed, well-nourished, in NAD HEENT - /AT, PERRL, no scleral icterus, no nasal drainage, dentures in place.  Pharynx without erythema or exudate. Neck: No JVD, no thyromegaly, no carotid bruits Lungs: no use of accessory muscles, CTAB, no wheezes, rales or rhonchi Cardiovascular: RRR, No r/g/m, no peripheral edema Abdomen: BS present, soft, nontender, nondistended Neuro:  A&Ox3, CN II-XII intact, normal gait Skin:  Warm, dry, intact, no lesions  Assessment/Plan: Essential hypertension -Controlled -Continue Norvasc 10 mg, HCTZ 12.5 mg, metoprolol tartrate 25 mg twice daily -Patient encouraged to purchase a BP cuff for home use and keep a log. -Discussed lifestyle modifications including quitting smoking, decreasing sodium intake, increasing physical activity. -If BP remains controlled discussed d/c'ing HCTZ.  Will evaluate at next [facility].  Cigarette nicotine dependence without complication -Smoking cessation counseling greater than 3 minutes, less than 10 minutes -Patient smoking 1pack to 1-1/2 days. -Patient considering quitting -Discussed various options to help quit -Given handout -We will readdress at each office visit.  Seasonal allergies -Discussed switching allergy medicines to Claritin or Allegra. -Okay to continue using saline nasal spray  Encounter to establish care -We reviewed the PMH, PSH, FH, SH, Meds and Allergies. -We provided refills for any medications we will prescribe as needed. -We addressed current concerns per orders and patient instructions. -We have asked for records for pertinent exams,  studies, vaccines and notes from previous providers. -We have advised patient to follow up per instructions below.  Follow-up in the next month  Grier Mitts, MD

## 2017-10-23 NOTE — Patient Instructions (Signed)
How to Take Your Blood Pressure You can take your blood pressure at home with a machine. You may need to check your blood pressure at home:  To check if you have high blood pressure (hypertension).  To check your blood pressure over time.  To make sure your blood pressure medicine is working.  Supplies needed: You will need a blood pressure machine, or monitor. You can buy one at a drugstore or online. When choosing one:  Choose one with an arm cuff.  Choose one that wraps around your upper arm. Only one finger should fit between your arm and the cuff.  Do not choose one that measures your blood pressure from your wrist or finger.  Your doctor can suggest a monitor. How to prepare Avoid these things for 30 minutes before checking your blood pressure:  Drinking caffeine.  Drinking alcohol.  Eating.  Smoking.  Exercising.  Five minutes before checking your blood pressure:  Pee.  Sit in a dining chair. Avoid sitting in a soft couch or armchair.  Be quiet. Do not talk.  How to take your blood pressure Follow the instructions that came with your machine. If you have a digital blood pressure monitor, these may be the instructions: 1. Sit up straight. 2. Place your feet on the floor. Do not cross your ankles or legs. 3. Rest your left arm at the level of your heart. You may rest it on a table, desk, or chair. 4. Pull up your shirt sleeve. 5. Wrap the blood pressure cuff around the upper part of your left arm. The cuff should be 1 inch (2.5 cm) above your elbow. It is best to wrap the cuff around bare skin. 6. Fit the cuff snugly around your arm. You should be able to place only one finger between the cuff and your arm. 7. Put the cord inside the groove of your elbow. 8. Press the power button. 9. Sit quietly while the cuff fills with air and loses air. 10. Write down the numbers on the screen. 11. Wait 2-3 minutes and then repeat steps 1-10.  What do the numbers  mean? Two numbers make up your blood pressure. The first number is called systolic pressure. The second is called diastolic pressure. An example of a blood pressure reading is "120 over 80" (or 120/80). If you are an adult and do not have a medical condition, use this guide to find out if your blood pressure is normal: Normal  First number: below 120.  Second number: below 80. Elevated  First number: 120-129.  Second number: below 80. Hypertension stage 1  First number: 130-139.  Second number: 80-89. Hypertension stage 2  First number: 140 or above.  Second number: 90 or above. Your blood pressure is above normal even if only the top or bottom number is above normal. Follow these instructions at home:  Check your blood pressure as often as your doctor tells you to.  Take your monitor to your next doctor's appointment. Your doctor will: ? Make sure you are using it correctly. ? Make sure it is working right.  Make sure you understand what your blood pressure numbers should be.  Tell your doctor if your medicines are causing side effects. Contact a doctor if:  Your blood pressure keeps being high. Get help right away if:  Your first blood pressure number is higher than 180.  Your second blood pressure number is higher than 120. This information is not intended to replace advice given   to you by your health care provider. Make sure you discuss any questions you have with your health care provider. Document Released: 03/28/2008 Document Revised: 03/13/2016 Document Reviewed: 09/22/2015 Elsevier Interactive Patient Education  2018 [place] Eating Plan DASH stands for "Dietary Approaches to Stop Hypertension." The DASH eating plan is a healthy eating plan that has been shown to reduce high blood pressure (hypertension). It may also reduce your risk for type 2 diabetes, heart disease, and stroke. The DASH eating plan may also help with weight loss. What are tips  for following this plan? General guidelines  Avoid eating more than 2,300 mg (milligrams) of salt (sodium) a day. If you have hypertension, you may need to reduce your sodium intake to 1,500 mg a day.  Limit alcohol intake to no more than 1 drink a day for nonpregnant women and 2 drinks a day for men. One drink equals 12 oz of beer, 5 oz of wine, or 1 oz of hard liquor.  Work with your health care provider to maintain a healthy body weight or to lose weight. Ask what an ideal weight is for you.  Get at least 30 minutes of exercise that causes your heart to beat faster (aerobic exercise) most days of the week. Activities may include walking, swimming, or biking.  Work with your health care provider or diet and nutrition specialist (dietitian) to adjust your eating plan to your individual calorie needs. Reading food labels  Check food labels for the amount of sodium per serving. Choose foods with less than 5 percent of the Daily Value of sodium. Generally, foods with less than 300 mg of sodium per serving fit into this eating plan.  To find whole grains, look for the word "whole" as the first word in the ingredient list. Shopping  Buy products labeled as "low-sodium" or "no salt added."  Buy fresh foods. Avoid canned foods and premade or frozen meals. Cooking  Avoid adding salt when cooking. Use salt-free seasonings or herbs instead of table salt or sea salt. Check with your health care provider or pharmacist before using salt substitutes.  Do not fry foods. Cook foods using healthy methods such as baking, boiling, grilling, and broiling instead.  Cook with heart-healthy oils, such as olive, canola, soybean, or sunflower oil. Meal planning   Eat a balanced diet that includes: ? 5 or more servings of fruits and vegetables each day. At each meal, try to fill half of your plate with fruits and vegetables. ? Up to 6-8 servings of whole grains each day. ? Less than 6 oz of lean meat,  poultry, or fish each day. A 3-oz serving of meat is about the same size as a deck of cards. One egg equals 1 oz. ? 2 servings of low-fat dairy each day. ? A serving of nuts, seeds, or beans 5 times each week. ? Heart-healthy fats. Healthy fats called Omega-3 fatty acids are found in foods such as flaxseeds and coldwater fish, like sardines, salmon, and mackerel.  Limit how much you eat of the following: ? Canned or prepackaged foods. ? Food that is high in trans fat, such as fried foods. ? Food that is high in saturated fat, such as fatty meat. ? Sweets, desserts, sugary drinks, and other foods with added sugar. ? Full-fat dairy products.  Do not salt foods before eating.  Try to eat at least 2 vegetarian meals each week.  Eat more home-cooked food and less restaurant, buffet, and fast food.  When eating at a restaurant, ask that your food be prepared with less salt or no salt, if possible. What foods are recommended? The items listed may not be a complete list. Talk with your dietitian about what dietary choices are best for you. Grains Whole-grain or whole-wheat bread. Whole-grain or whole-wheat pasta. Brown rice. [place]. Bulgur. Whole-grain and low-sodium cereals. Pita bread. Low-fat, low-sodium crackers. Whole-wheat flour tortillas. Vegetables Fresh or frozen vegetables (raw, steamed, roasted, or grilled). Low-sodium or reduced-sodium tomato and vegetable juice. Low-sodium or reduced-sodium tomato sauce and tomato paste. Low-sodium or reduced-sodium canned vegetables. Fruits All fresh, dried, or frozen fruit. Canned fruit in natural juice (without added sugar). Meat and other protein foods Skinless chicken or [place]. Ground chicken or [place]. Pork with fat trimmed off. Fish and seafood. Egg whites. Dried beans, peas, or lentils. Unsalted nuts, nut butters, and seeds. Unsalted canned beans. Lean cuts of beef with fat trimmed off. Low-sodium, lean deli meat. Dairy Low-fat  (1%) or fat-free (skim) milk. Fat-free, low-fat, or reduced-fat cheeses. Nonfat, low-sodium ricotta or cottage cheese. Low-fat or nonfat yogurt. Low-fat, low-sodium cheese. Fats and oils Soft margarine without trans fats. Vegetable oil. Low-fat, reduced-fat, or light mayonnaise and salad dressings (reduced-sodium). Canola, safflower, olive, soybean, and sunflower oils. Avocado. Seasoning and other foods Herbs. Spices. Seasoning mixes without salt. Unsalted popcorn and pretzels. Fat-free sweets. What foods are not recommended? The items listed may not be a complete list. Talk with your dietitian about what dietary choices are best for you. Grains Baked goods made with fat, such as croissants, muffins, or some breads. Dry pasta or rice meal packs. Vegetables Creamed or fried vegetables. Vegetables in a cheese sauce. Regular canned vegetables (not low-sodium or reduced-sodium). Regular canned tomato sauce and paste (not low-sodium or reduced-sodium). Regular tomato and vegetable juice (not low-sodium or reduced-sodium). Angie Fava. Olives. Fruits Canned fruit in a light or heavy syrup. Fried fruit. Fruit in cream or butter sauce. Meat and other protein foods Fatty cuts of meat. Ribs. Fried meat. Berniece Salines. Sausage. Bologna and other processed lunch meats. Salami. Fatback. Hotdogs. Bratwurst. Salted nuts and seeds. Canned beans with added salt. Canned or smoked fish. Whole eggs or egg yolks. Chicken or [place] with skin. Dairy Whole or 2% milk, cream, and half-and-half. Whole or full-fat cream cheese. Whole-fat or sweetened yogurt. Full-fat cheese. Nondairy creamers. Whipped toppings. Processed cheese and cheese spreads. Fats and oils Butter. Stick margarine. Lard. Shortening. Ghee. Bacon fat. Tropical oils, such as coconut, palm kernel, or palm oil. Seasoning and other foods Salted popcorn and pretzels. Onion salt, garlic salt, seasoned salt, table salt, and sea salt. Worcestershire sauce. Tartar sauce.  Barbecue sauce. Teriyaki sauce. Soy sauce, including reduced-sodium. Steak sauce. Canned and packaged gravies. Fish sauce. Oyster sauce. Cocktail sauce. Horseradish that you find on the shelf. Ketchup. Mustard. Meat flavorings and tenderizers. Bouillon cubes. Hot sauce and Tabasco sauce. Premade or packaged marinades. Premade or packaged taco seasonings. Relishes. Regular salad dressings. Where to find more information:  National Heart, Lung, and [place]: https://wilson-eaton.com/  American Heart Association: www.heart.org Summary  The DASH eating plan is a healthy eating plan that has been shown to reduce high blood pressure (hypertension). It may also reduce your risk for type 2 diabetes, heart disease, and stroke.  With the DASH eating plan, you should limit salt (sodium) intake to 2,300 mg a day. If you have hypertension, you may need to reduce your sodium intake to 1,500 mg a day.  When on the DASH eating plan,  aim to eat more fresh fruits and vegetables, whole grains, lean proteins, low-fat dairy, and heart-healthy fats.  Work with your health care provider or diet and nutrition specialist (dietitian) to adjust your eating plan to your individual calorie needs. This information is not intended to replace advice given to you by your health care provider. Make sure you discuss any questions you have with your health care provider. Document Released: 04/04/2011 Document Revised: 04/08/2016 Document Reviewed: 04/08/2016 Elsevier Interactive Patient Education  2018 [employer].  Steps to Quit Smoking Smoking tobacco can be bad for your health. It can also affect almost every organ in your body. Smoking puts you and people around you at risk for many serious long-lasting (chronic) diseases. Quitting smoking is hard, but it is one of the best things that you can do for your health. It is never too late to quit. What are the benefits of quitting smoking? When you quit smoking, you lower your  risk for getting serious diseases and conditions. They can include:  Lung cancer or lung disease.  Heart disease.  Stroke.  Heart attack.  Not being able to have children (infertility).  Weak bones (osteoporosis) and broken bones (fractures).  If you have coughing, wheezing, and shortness of breath, those symptoms may get better when you quit. You may also get sick less often. If you are pregnant, quitting smoking can help to lower your chances of having a baby of low birth weight. What can I do to help me quit smoking? Talk with your doctor about what can help you quit smoking. Some things you can do (strategies) include:  Quitting smoking totally, instead of slowly cutting back how much you smoke over a period of time.  Going to in-person counseling. You are more likely to quit if you go to many counseling sessions.  Using resources and support systems, such as: ? Database administrator with a Social worker. ? Phone quitlines. ? Careers information officer. ? Support groups or group counseling. ? Text messaging programs. ? Mobile phone apps or applications.  Taking medicines. Some of these medicines may have nicotine in them. If you are pregnant or breastfeeding, do not take any medicines to quit smoking unless your doctor says it is okay. Talk with your doctor about counseling or other things that can help you.  Talk with your doctor about using more than one strategy at the same time, such as taking medicines while you are also going to in-person counseling. This can help make quitting easier. What things can I do to make it easier to quit? Quitting smoking might feel very hard at first, but there is a lot that you can do to make it easier. Take these steps:  Talk to your family and friends. Ask them to support and encourage you.  Call phone quitlines, reach out to support groups, or work with a Social worker.  Ask people who smoke to not smoke around you.  Avoid places that make you want  (trigger) to smoke, such as: ? Bars. ? Parties. ? Smoke-break areas at work.  Spend time with people who do not smoke.  Lower the stress in your life. Stress can make you want to smoke. Try these things to help your stress: ? Getting regular exercise. ? Deep-breathing exercises. ? Yoga. ? Meditating. ? Doing a body scan. To do this, close your eyes, focus on one area of your body at a time from head to toe, and notice which parts of your body are tense. Try  to relax the muscles in those areas.  Download or buy apps on your mobile phone or tablet that can help you stick to your quit plan. There are many free apps, such as QuitGuide from the [employer] Office manager for Disease Control and Prevention). You can find more support from smokefree.gov and other websites.  This information is not intended to replace advice given to you by your health care provider. Make sure you discuss any questions you have with your health care provider. Document Released: 02/09/2009 Document Revised: 12/12/2015 Document Reviewed: 08/30/2014 Elsevier Interactive Patient Education  2018 [employer].

## 2017-11-13 ENCOUNTER — Encounter: Payer: Self-pay | Admitting: Family Medicine

## 2017-11-13 ENCOUNTER — Ambulatory Visit (INDEPENDENT_AMBULATORY_CARE_PROVIDER_SITE_OTHER): Payer: Medicare HMO | Admitting: Family Medicine

## 2017-11-13 VITALS — BP 118/78 | HR 82 | Temp 98.5°F | Wt 147.0 lb

## 2017-11-13 DIAGNOSIS — F17210 Nicotine dependence, cigarettes, uncomplicated: Secondary | ICD-10-CM | POA: Diagnosis not present

## 2017-11-13 DIAGNOSIS — I10 Essential (primary) hypertension: Secondary | ICD-10-CM | POA: Diagnosis not present

## 2017-11-13 MED ORDER — AMLODIPINE BESYLATE 10 MG PO TABS: 10.0000 mg | ORAL_TABLET | Freq: Every day | ORAL | 0 refills | Status: DC

## 2017-11-13 MED ORDER — METOPROLOL TARTRATE 25 MG PO TABS: 25.0000 mg | ORAL_TABLET | Freq: Two times a day (BID) | ORAL | 0 refills | Status: DC

## 2017-11-13 MED ORDER — HYDROCHLOROTHIAZIDE 12.5 MG PO CAPS: 12.5000 mg | ORAL_CAPSULE | Freq: Every day | ORAL | 0 refills | Status: DC

## 2017-11-13 MED ORDER — NICOTINE 21 MG/24HR TD PT24: 21.0000 mg | MEDICATED_PATCH | Freq: Every day | TRANSDERMAL | 0 refills | Status: DC

## 2017-11-13 NOTE — Patient Instructions (Signed)
Coping with Quitting Smoking Quitting smoking is a physical and mental challenge. You will face cravings, withdrawal symptoms, and temptation. Before quitting, work with your health care provider to make a plan that can help you cope. Preparation can help you quit and keep you from giving in. How can I cope with cravings? Cravings usually last for 5-10 minutes. If you get through it, the craving will pass. Consider taking the following actions to help you cope with cravings:  Keep your mouth busy: ? Chew sugar-free gum. ? Suck on hard candies or a straw. ? Brush your teeth.  Keep your hands and body busy: ? Immediately change to a different activity when you feel a craving. ? Squeeze or play with a ball. ? Do an activity or a hobby, like making bead jewelry, practicing needlepoint, or working with wood. ? Mix up your normal routine. ? Take a short exercise break. Go for a quick walk or run up and down stairs. ? Spend time in public places where smoking is not allowed.  Focus on doing something kind or helpful for someone else.  Call a friend or family member to talk during a craving.  Join a support group.  Call a quit line, such as 1-800-QUIT-NOW.  Talk with your health care provider about medicines that might help you cope with cravings and make quitting easier for you.  How can I deal with withdrawal symptoms? Your body may experience negative effects as it tries to get used to not having nicotine in the system. These effects are called withdrawal symptoms. They may include:  Feeling hungrier than normal.  Trouble concentrating.  Irritability.  Trouble sleeping.  Feeling depressed.  Restlessness and agitation.  Craving a cigarette.  To manage withdrawal symptoms:  Avoid places, people, and activities that trigger your cravings.  Remember why you want to quit.  Get plenty of sleep.  Avoid coffee and other caffeinated drinks. These may worsen some of your  symptoms.  How can I handle social situations? Social situations can be difficult when you are quitting smoking, especially in the first few weeks. To manage this, you can:  Avoid parties, bars, and other social situations where people might be smoking.  Avoid alcohol.  Leave right away if you have the urge to smoke.  Explain to your family and friends that you are quitting smoking. Ask for understanding and support.  Plan activities with friends or family where smoking is not an option.  What are some ways I can cope with stress? Wanting to smoke may cause stress, and stress can make you want to smoke. Find ways to manage your stress. Relaxation techniques can help. For example:  Breathe slowly and deeply, in through your nose and out through your mouth.  Listen to soothing, relaxing music.  Talk with a family member or friend about your stress.  Light a candle.  Soak in a bath or take a shower.  Think about a peaceful place.  What are some ways I can prevent weight gain? Be aware that many people gain weight after they quit smoking. However, not everyone does. To keep from gaining weight, have a plan in place before you quit and stick to the plan after you quit. Your plan should include:  Having healthy snacks. When you have a craving, it may help to: ? Eat plain popcorn, crunchy carrots, celery, or other cut vegetables. ? Chew sugar-free gum.  Changing how you eat: ? Eat small portion sizes at meals. ?   Eat 4-6 small meals throughout the day instead of 1-2 large meals a day. ? Be mindful when you eat. Do not watch television or do other things that might distract you as you eat.  Exercising regularly: ? Make time to exercise each day. If you do not have time for a long workout, do short bouts of exercise for 5-10 minutes several times a day. ? Do some form of strengthening exercise, like weight lifting, and some form of aerobic exercise, like running or  swimming.  Drinking plenty of water or other low-calorie or no-calorie drinks. Drink 6-8 glasses of water daily, or as much as instructed by your health care provider.  Summary  Quitting smoking is a physical and mental challenge. You will face cravings, withdrawal symptoms, and temptation to smoke again. Preparation can help you as you go through these challenges.  You can cope with cravings by keeping your mouth busy (such as by chewing gum), keeping your body and hands busy, and making calls to family, friends, or a helpline for people who want to quit smoking.  You can cope with withdrawal symptoms by avoiding places where people smoke, avoiding drinks with caffeine, and getting plenty of rest.  Ask your health care provider about the different ways to prevent weight gain, avoid stress, and handle social situations. This information is not intended to replace advice given to you by your health care provider. Make sure you discuss any questions you have with your health care provider. Document Released: 04/12/2016 Document Revised: 04/12/2016 Document Reviewed: 04/12/2016 Elsevier Interactive Patient Education  2018 [employer]. Nicotine skin patches What is this medicine? NICOTINE ([place]) helps people stop smoking. The patches replace the nicotine found in cigarettes and help to decrease withdrawal effects. They are most effective when used in combination with a stop-smoking program. This medicine may be used for other purposes; ask your health care provider or pharmacist if you have questions. COMMON BRAND NAME(S): Habitrol, Nicoderm CQ, Nicotrol What should I tell my health care provider before I take this medicine? They need to know if you have any of these conditions: -diabetes -heart disease, angina, irregular heartbeat or previous heart attack -high blood pressure -lung disease, including asthma -overactive thyroid -pheochromocytoma -seizures or a history of  seizures -skin problems, like eczema -stomach problems or ulcers -an unusual or allergic reaction to nicotine, adhesives, other medicines, foods, dyes, or preservatives -pregnant or trying to get pregnant -breast-feeding How should I use this medicine? This medicine is for use on the skin. Follow the directions that come with the patches. Find an area of skin on your upper arm, chest, or back that is clean, dry, greaseless, undamaged and hairless. Wash hands with plain soap and water. Do not use anything that contains aloe, lanolin or glycerin as these may prevent the patch from sticking. Dry thoroughly. Remove the patch from the sealed pouch. Do not try to cut or trim the patch. Using your palm, press the patch firmly in place for 10 seconds to make sure that there is good contact with your skin. After applying the patch, wash your hands. Change the patch every day, keeping to a regular schedule. When you apply a new patch, use a new area of skin. Wait at least 1 week before using the same area again. Talk to your pediatrician regarding the use of this medicine in children. Special care may be needed. Overdosage: If you think you have taken too much of this medicine contact a poison  control center or emergency room at once. NOTE: This medicine is only for you. Do not share this medicine with others. What if I miss a dose? If you forget to replace a patch, use it as soon as you can. Only use one patch at a time and do not leave on the skin for longer than directed. If a patch falls off, you can replace it, but keep to your schedule and remove the patch at the right time. What may interact with this medicine? -medicines for asthma -medicines for blood pressure -medicines for mental depression This list may not describe all possible interactions. Give your health care provider a list of all the medicines, herbs, non-prescription drugs, or dietary supplements you use. Also tell them if you smoke, drink  alcohol, or use illegal drugs. Some items may interact with your medicine. What should I watch for while using this medicine? You should begin using the nicotine patch the day you stop smoking. It is okay if you do not succeed at your attempt to quit and have a cigarette. You can still continue your quit attempt and keep using the product as directed. Just throw away your cigarettes and get back to your quit plan. You can keep the patch in place during swimming, bathing, and showering. If your patch falls off during these activities, replace it. When you first apply the patch, your skin may itch or burn. This should go away soon. When you remove a patch, the skin may look red, but this should only last for a few days. Call your doctor or health care professional if skin redness does not go away after 4 days, if your skin swells, or if you get a rash. If you are a diabetic and you quit smoking, the effects of insulin may be increased and you may need to reduce your insulin dose. Check with your doctor or health care professional about how you should adjust your insulin dose. If you are going to have a magnetic resonance imaging (MRI) procedure, tell your MRI technician if you have this patch on your body. It must be removed before a MRI. What side effects may I notice from receiving this medicine? Side effects that you should report to your doctor or health care professional as soon as possible: -allergic reactions like skin rash, itching or hives, swelling of the face, lips, or tongue -breathing problems -changes in hearing -changes in vision -chest pain -cold sweats -confusion -fast, irregular heartbeat -feeling faint or lightheaded, falls -headache -increased saliva -skin redness that lasts more than 4 days -stomach pain -signs and symptoms of nicotine overdose like nausea; vomiting; dizziness; weakness; and rapid heartbeat Side effects that usually do not require medical attention (report  to your doctor or health care professional if they continue or are bothersome): -diarrhea -dry mouth -hiccups -irritability -nervousness or restlessness -trouble sleeping or vivid dreams This list may not describe all possible side effects. Call your doctor for medical advice about side effects. You may report side effects to FDA at 1-800-FDA-1088. Where should I keep my medicine? Keep out of the reach of children. Store at room temperature between 20 and 25 degrees C (68 and 77 degrees F). Protect from heat and light. Store in International aid/development worker until ready to use. Throw away unused medicine after the expiration date. When you remove a patch, fold with sticky sides together; put in an empty opened pouch and throw away. NOTE: This sheet is a summary. It may not cover all possible  information. If you have questions about this medicine, talk to your doctor, pharmacist, or health care provider.  2018 Elsevier/Gold Standard (2014-03-14 15:46:21)

## 2017-11-13 NOTE — Progress Notes (Signed)
Subjective:    Patient ID: Virginia Watson, female    DOB: 1946/04/26, 72 y.o.   MRN: [id number]  No chief complaint on file.   HPI Patient was seen today for follow-up on BP.  Pt endorses taking Norvasc 10 mg, hydrochlorothiazide 12.5 mg, metoprolol tartrate 25 mg twice daily pt states she has been checking BP at home, systolic typically 1 [id number].  Pt has been keeping a log but forgot it at home.  Pt eating less fried foods.  Pt denies headaches, changes in vision, chest pain.  Tobacco use: Pt has cut smoking down to 1 pack/day from 1-1/2 packs/day.  Pt is interested in quitting asks about patches.  Past Medical History:  Diagnosis Date  . Allergy   . Hypertension     No Known Allergies  ROS General: Denies fever, chills, night sweats, changes in weight, changes in appetite HEENT: Denies headaches, ear pain, changes in vision, rhinorrhea, sore throat CV: Denies CP, palpitations, SOB, orthopnea Pulm: Denies SOB, cough, wheezing GI: Denies abdominal pain, nausea, vomiting, diarrhea, constipation GU: Denies dysuria, hematuria, frequency, vaginal discharge Msk: Denies muscle cramps, joint pains Neuro: Denies weakness, numbness, tingling Skin: Denies rashes, bruising Psych: Denies depression, anxiety, hallucinations     Objective:    Blood pressure 118/78, pulse 82, temperature 98.5 F (36.9 C), temperature source Oral, weight 147 lb (66.7 kg), SpO2 96 %.   Gen. Pleasant, well-nourished, in no distress, normal affect   HEENT: [place]/AT, face symmetric, no scleral icterus, PERRLA, nares patent without drainage Lungs: no accessory muscle use, CTAB, no wheezes or rales Cardiovascular: RRR, no m/r/g, no peripheral edema Neuro:  A&Ox3, CN II-XII intact, normal gait Skin:  Warm, no lesions/ rash   Wt Readings from Last 3 Encounters:  11/13/17 147 lb (66.7 kg)  10/23/17 143 lb (64.9 kg)  10/14/17 146 lb 14.4 oz (66.6 kg)    Lab Results  Component Value Date   WBC 5.3  10/14/2017   HGB 10.8 (L) 10/14/2017   HCT 32.2 (L) 10/14/2017   PLT 156 10/14/2017   GLUCOSE 101 (H) 10/14/2017   NA 139 10/14/2017   K 3.2 (L) 10/14/2017   CL 106 10/14/2017   CREATININE 1.03 (H) 10/14/2017   BUN 11 10/14/2017   CO2 27 10/14/2017    Assessment/Plan:  Essential hypertension  -controlled -continue lifestyle modifications - Plan: amLODipine (NORVASC) 10 MG tablet, hydrochlorothiazide (MICROZIDE) 12.5 MG capsule, metoprolol tartrate (LOPRESSOR) 25 MG tablet  Cigarette nicotine dependence without complication  -Smoking cessation counseling greater than 3 minutes, less than 10 minutes -Patient now smoking 1 pack/day.  Down from 1-1/2 packs/day. -Discussed various quit aids including patches.  Patient would like to try the patch. -Given Rx nicotine 21 mg patches.  Patient to use 1 patch daily for 6 weeks.  Patient will need to call in before running out 21 mg patches so that 14 mg patches can be sent in for weeks7-8.  Then 7 mg patches for weeks 9 through 10. - Plan: nicotine (NICODERM CQ - DOSED IN MG/24 HOURS) 21 mg/24hr patch  Follow-up in the next few months  Grier Mitts, MD

## 2017-12-15 ENCOUNTER — Telehealth: Payer: Self-pay | Admitting: Family Medicine

## 2017-12-15 DIAGNOSIS — I10 Essential (primary) hypertension: Secondary | ICD-10-CM

## 2017-12-15 MED ORDER — METOPROLOL TARTRATE 25 MG PO TABS: 25.0000 mg | ORAL_TABLET | Freq: Two times a day (BID) | ORAL | 2 refills | Status: DC

## 2017-12-15 MED ORDER — AMLODIPINE BESYLATE 10 MG PO TABS: 10.0000 mg | ORAL_TABLET | Freq: Every day | ORAL | 2 refills | Status: DC

## 2017-12-15 MED ORDER — HYDROCHLOROTHIAZIDE 12.5 MG PO CAPS: 12.5000 mg | ORAL_CAPSULE | Freq: Every day | ORAL | 2 refills | Status: DC

## 2017-12-15 NOTE — Telephone Encounter (Signed)
Copied from [place] [id number]. Topic: Quick Communication - Rx Refill/Question >> Dec 15, 2017 10:00 AM Margot Ables wrote: Medication: amlodipine - 2 pills left, Hydrochlorothiazide - pt out, Metoprolol - pt out Pt states not using the nicotine patches verbalizing "I could not do that" Has the patient contacted their pharmacy? No refills on RXs Preferred Pharmacy (with phone number or street name): [facility] [phone] - [place], [place] [address] [phone] (Phone) [phone] (Fax)  Pt was advised to call 5-7 days in advance for RX refills

## 2018-01-12 ENCOUNTER — Other Ambulatory Visit: Payer: Self-pay | Admitting: Family Medicine

## 2018-01-12 DIAGNOSIS — I10 Essential (primary) hypertension: Secondary | ICD-10-CM

## 2018-01-12 NOTE — Telephone Encounter (Signed)
Pt has enough refill at the pharmacy, not due for refill

## 2018-03-12 ENCOUNTER — Other Ambulatory Visit: Payer: Self-pay | Admitting: Family Medicine

## 2018-03-12 DIAGNOSIS — I10 Essential (primary) hypertension: Secondary | ICD-10-CM

## 2018-04-11 ENCOUNTER — Other Ambulatory Visit: Payer: Self-pay | Admitting: Family Medicine

## 2018-04-11 DIAGNOSIS — I10 Essential (primary) hypertension: Secondary | ICD-10-CM

## 2018-05-11 ENCOUNTER — Other Ambulatory Visit: Payer: Self-pay | Admitting: Family Medicine

## 2018-05-11 DIAGNOSIS — I10 Essential (primary) hypertension: Secondary | ICD-10-CM

## 2018-05-11 NOTE — Telephone Encounter (Signed)
Pt needs an appointment for further refills  

## 2018-05-11 NOTE — Telephone Encounter (Signed)
Spoke with pt voiced understanding that she needs a follow up appointment for her  BP medications

## 2018-07-10 ENCOUNTER — Other Ambulatory Visit: Payer: Self-pay | Admitting: Family Medicine

## 2018-07-10 DIAGNOSIS — I10 Essential (primary) hypertension: Secondary | ICD-10-CM

## 2018-08-09 ENCOUNTER — Other Ambulatory Visit: Payer: Self-pay | Admitting: Family Medicine

## 2018-08-09 DIAGNOSIS — I10 Essential (primary) hypertension: Secondary | ICD-10-CM

## 2018-08-10 NOTE — Telephone Encounter (Signed)
BP readings  135/80 usually how it runs (yesterday) 145/80 a couple months ago

## 2018-08-11 ENCOUNTER — Other Ambulatory Visit: Payer: Self-pay | Admitting: Family Medicine

## 2018-08-11 DIAGNOSIS — I10 Essential (primary) hypertension: Secondary | ICD-10-CM

## 2018-11-07 ENCOUNTER — Other Ambulatory Visit: Payer: Self-pay | Admitting: Family Medicine

## 2018-11-07 DIAGNOSIS — I10 Essential (primary) hypertension: Secondary | ICD-10-CM

## 2018-11-09 NOTE — Telephone Encounter (Signed)
Pt needs an appointment for further refills  

## 2019-03-15 ENCOUNTER — Telehealth (INDEPENDENT_AMBULATORY_CARE_PROVIDER_SITE_OTHER): Payer: Medicare HMO | Admitting: Family Medicine

## 2019-03-15 ENCOUNTER — Other Ambulatory Visit: Payer: Self-pay

## 2019-03-15 ENCOUNTER — Emergency Department (HOSPITAL_COMMUNITY)
Admission: EM | Admit: 2019-03-15 | Discharge: 2019-03-15 | Disposition: A | Discharge status: Home / Self Care | Payer: Medicare HMO | Attending: Emergency Medicine | Admitting: Emergency Medicine

## 2019-03-15 DIAGNOSIS — Z862 Personal history of diseases of the blood and blood-forming organs and certain disorders involving the immune mechanism: Secondary | ICD-10-CM

## 2019-03-15 DIAGNOSIS — I10 Essential (primary) hypertension: Secondary | ICD-10-CM

## 2019-03-15 DIAGNOSIS — Z13220 Encounter for screening for lipoid disorders: Secondary | ICD-10-CM | POA: Diagnosis not present

## 2019-03-15 DIAGNOSIS — R040 Epistaxis: Secondary | ICD-10-CM | POA: Diagnosis not present

## 2019-03-15 DIAGNOSIS — Z5321 Procedure and treatment not carried out due to patient leaving prior to being seen by health care provider: Secondary | ICD-10-CM | POA: Insufficient documentation

## 2019-03-15 DIAGNOSIS — F17210 Nicotine dependence, cigarettes, uncomplicated: Secondary | ICD-10-CM | POA: Diagnosis not present

## 2019-03-15 MED ORDER — AMLODIPINE BESYLATE 10 MG PO TABS: 10.0000 mg | ORAL_TABLET | Freq: Every day | ORAL | 1 refills | Status: DC

## 2019-03-15 MED ORDER — HYDROCHLOROTHIAZIDE 12.5 MG PO CAPS: 12.5000 mg | ORAL_CAPSULE | Freq: Every day | ORAL | 1 refills | Status: DC

## 2019-03-15 MED ORDER — METOPROLOL TARTRATE 25 MG PO TABS: 25.0000 mg | ORAL_TABLET | Freq: Two times a day (BID) | ORAL | 1 refills | Status: DC

## 2019-03-15 NOTE — Progress Notes (Signed)
Virtual Visit via Video Note  I connected with Virginia Watson on 03/15/19 at  1:30 PM EST by a video enabled telemedicine application 2/2 [id number] pandemic and verified that I am speaking with the correct person using two identifiers.  Location patient: home Location provider:work or home office Persons participating in the virtual visit: patient, provider  I discussed the limitations of evaluation and management by telemedicine and the availability of in person appointments. The patient expressed understanding and agreed to proceed.   HPI: Pt states she is working 60 hours per wk, as her hours were previously cut in March 2/2 COVID pandemic.  Pt at times forgets to take her bp meds.  States bp is 120s/30s? Per memory by pt.  Pt states she may drink 1 bottle per day.  Took a day and a half to smoke a pack of cigarettes.     ROS: See pertinent positives and negatives per HPI.  Past Medical History:  Diagnosis Date  . Allergy   . Hypertension     No past surgical history on file.  Family History  Problem Relation Age of Onset  . Alcohol abuse Mother   . Asthma Mother   . Hyperlipidemia Mother   . Alcohol abuse Father   . Heart attack Father   . Depression Sister   . Hypertension Sister   . Drug abuse Brother   . Alcohol abuse Brother      Current Outpatient Medications:  .  amLODipine (NORVASC) 10 MG tablet, TAKE 1 TABLET BY MOUTH EVERY DAY, Disp: 90 tablet, Rfl: 0 .  hydrochlorothiazide (MICROZIDE) 12.5 MG capsule, TAKE 1 CAPSULE BY MOUTH AT BEDTIME, Disp: 90 capsule, Rfl: 0 .  metoprolol tartrate (LOPRESSOR) 25 MG tablet, TAKE 1 TABLET BY MOUTH TWICE DAILY, Disp: 180 tablet, Rfl: 0 .  nicotine (NICODERM CQ - DOSED IN MG/24 HOURS) 21 mg/24hr patch, Place 1 patch (21 mg total) onto the skin daily., Disp: 45 patch, Rfl: 0  EXAM:  VITALS per patient if applicable: RR between 123456 bpm  GENERAL: alert, oriented, appears well and in no acute distress  HEENT: atraumatic,  conjunctiva clear, no obvious abnormalities on inspection of external nose and ears  NECK: normal movements of the head and neck  LUNGS: on inspection no signs of respiratory distress, breathing rate appears normal, no obvious gross SOB, gasping or wheezing  CV: no obvious cyanosis  MS: moves all visible extremities without noticeable abnormality  PSYCH/NEURO: pleasant and cooperative, no obvious depression or anxiety, speech and thought processing grossly intact  ASSESSMENT AND PLAN:  Discussed the following assessment and plan:  Essential hypertension -stable  -pt encouraged to increase po intake of water -continue checking bp at home and keep a log of the readings -lifestyle modifications encouraged - Plan: Basic Metabolic Panel, amLODipine (NORVASC) 10 MG tablet, hydrochlorothiazide (MICROZIDE) 12.5 MG capsule, metoprolol tartrate (LOPRESSOR) 25 MG tablet  Cigarette nicotine dependence without complication  -smoking cessation counseling >3 min, <10 min -pt encouraged to cut down on cigarette use. -encouraged to consider restarting patch -will continue to monitor - Plan: CBC (no diff)  History of anemia  - Plan: CBC (no diff)  Screening for cholesterol level  - Plan: Lipid panel  Pt to have labs next Monday.   I discussed the assessment and treatment plan with the patient. The patient was provided an opportunity to ask questions and all were answered. The patient agreed with the plan and demonstrated an understanding of the instructions.   The patient  was advised to call back or seek an in-person evaluation if the symptoms worsen or if the condition fails to improve as anticipated.   Billie Ruddy, MD

## 2019-03-15 NOTE — ED Triage Notes (Signed)
Patient with hypertension, had a nosebleed earlier.  She has stopped bleeding, she did have clots earlier.  She wanted another BP which was 133/88.  She states that she is feeling better and wants to leave.

## 2019-03-19 ENCOUNTER — Encounter (HOSPITAL_COMMUNITY): Payer: Self-pay | Admitting: *Deleted

## 2019-03-19 ENCOUNTER — Emergency Department (HOSPITAL_COMMUNITY)
Admission: EM | Admit: 2019-03-19 | Discharge: 2019-03-19 | Disposition: A | Discharge status: Home / Self Care | Payer: Medicare HMO | Attending: Emergency Medicine | Admitting: Emergency Medicine

## 2019-03-19 ENCOUNTER — Other Ambulatory Visit: Payer: Self-pay

## 2019-03-19 DIAGNOSIS — R040 Epistaxis: Secondary | ICD-10-CM

## 2019-03-19 DIAGNOSIS — F17210 Nicotine dependence, cigarettes, uncomplicated: Secondary | ICD-10-CM | POA: Diagnosis not present

## 2019-03-19 DIAGNOSIS — I10 Essential (primary) hypertension: Secondary | ICD-10-CM | POA: Diagnosis present

## 2019-03-19 DIAGNOSIS — Z79899 Other long term (current) drug therapy: Secondary | ICD-10-CM | POA: Diagnosis not present

## 2019-03-19 DIAGNOSIS — R519 Headache, unspecified: Secondary | ICD-10-CM

## 2019-03-19 NOTE — ED Provider Notes (Signed)
[facility] Provider Note   CSN: [id number] Arrival date & time: 03/19/19  0024     History   Chief Complaint Chief Complaint  Patient presents with  . Hypertension    HPI Virginia Watson is a 73 y.o. female.     The history is provided by the patient.  Hypertension This is a recurrent problem. The current episode started 3 to 5 hours ago. The problem occurs constantly. The problem has been resolved. Associated symptoms include headaches. Pertinent negatives include no chest pain. Nothing aggravates the symptoms. Nothing relieves the symptoms. She has tried nothing for the symptoms.    Past Medical History:  Diagnosis Date  . Allergy   . Hypertension     Patient Active Problem List   Diagnosis Date Noted  . Cigarette nicotine dependence without complication [id number]  . Seasonal allergies 10/23/2017  . Hypertension 10/13/2017  . Hypertensive urgency 10/13/2017  . Epistaxis 10/13/2017    History reviewed. No pertinent surgical history.   OB History   No obstetric history on file.      Home Medications    Prior to Admission medications   Medication Sig Start Date End Date Taking? Authorizing Provider  amLODipine (NORVASC) 10 MG tablet Take 1 tablet (10 mg total) by mouth daily. 03/15/19   Billie Ruddy, MD  hydrochlorothiazide (MICROZIDE) 12.5 MG capsule Take 1 capsule (12.5 mg total) by mouth at bedtime. 03/15/19   Billie Ruddy, MD  metoprolol tartrate (LOPRESSOR) 25 MG tablet Take 1 tablet (25 mg total) by mouth 2 (two) times daily. 03/15/19   Billie Ruddy, MD  nicotine (NICODERM CQ - DOSED IN MG/24 HOURS) 21 mg/24hr patch Place 1 patch (21 mg total) onto the skin daily. 11/13/17   Billie Ruddy, MD    Family History Family History  Problem Relation Age of Onset  . Alcohol abuse Mother   . Asthma Mother   . Hyperlipidemia Mother   . Alcohol abuse Father   . Heart attack Father   .  Depression Sister   . Hypertension Sister   . Drug abuse Brother   . Alcohol abuse Brother     Social History Social History   Tobacco Use  . Smoking status: Current Every Day Smoker    Packs/day: 0.50    Years: 40.00    Pack years: 20.00  . Smokeless tobacco: Current User  Substance Use Topics  . Alcohol use: Yes    Comment: recovering etoh addict  . Drug use: No     Allergies   Patient has no known allergies.   Review of Systems Review of Systems  HENT: Positive for nosebleeds.   Cardiovascular: Negative for chest pain.  Neurological: Positive for headaches.  All other systems reviewed and are negative.    Physical Exam Updated Vital Signs BP 132/85   Pulse 66   Temp 97.9 F (36.6 C) (Oral)   Resp 18   SpO2 94%   Physical Exam Vitals signs and nursing note reviewed.  Constitutional:      Appearance: She is well-developed.  HENT:     Head: Normocephalic and atraumatic.  Eyes:     General: No visual field deficit.    Conjunctiva/sclera: Conjunctivae normal.  Neck:     Musculoskeletal: Normal range of motion.  Cardiovascular:     Rate and Rhythm: Normal rate and regular rhythm.  Pulmonary:     Effort: No respiratory distress.     Breath sounds:  No stridor.  Abdominal:     General: There is no distension.  Musculoskeletal: Normal range of motion.  Skin:    General: Skin is warm and dry.  Neurological:     General: No focal deficit present.     Mental Status: She is alert and oriented to person, place, and time.     Cranial Nerves: No cranial nerve deficit, dysarthria or facial asymmetry.     Motor: No weakness, tremor or pronator drift.     Coordination: Coordination is intact.      ED Treatments / Results  Labs (all labs ordered are listed, but only abnormal results are displayed) Labs Reviewed - No data to display  EKG None  Radiology No results found.  Procedures Procedures (including critical care time)  Medications Ordered in  ED Medications - No data to display   Initial Impression / Assessment and Plan / ED Course  I have reviewed the triage vital signs and the nursing notes.  Pertinent labs & imaging results that were available during my care of the patient were reviewed by me and considered in my medical decision making (see chart for details).        No clear complaint.  Has had nosebleeds, not now Has had right sided head discomfort, not now.  VS WNL. Neuro exam intact.  Stable for dc.    Final Clinical Impressions(s) / ED Diagnoses   Final diagnoses:  Nonintractable headache, unspecified chronicity pattern, unspecified headache type  Nosebleed    ED Discharge Orders    None       Camrin Lapre, Corene Cornea, MD 03/19/19 [phone]

## 2019-03-19 NOTE — ED Triage Notes (Signed)
Pt was here on Monday after having a nosebleed, bleeding had stopped prior to coming in at that time. Pt left due to wait times on Monday.   Pt reports hypertension after drinking a wine and eating a half a jar of olives 4 days ago (has been complaint with hctz, amlodipine, and metoprolol).  Reports headache, denies blurred vision or dizziness. No neuro symptoms; no known exposure to Covid.

## 2019-03-22 ENCOUNTER — Other Ambulatory Visit: Payer: Self-pay

## 2019-03-22 ENCOUNTER — Other Ambulatory Visit (INDEPENDENT_AMBULATORY_CARE_PROVIDER_SITE_OTHER): Payer: Medicare HMO

## 2019-03-22 DIAGNOSIS — Z13220 Encounter for screening for lipoid disorders: Secondary | ICD-10-CM | POA: Diagnosis not present

## 2019-03-22 DIAGNOSIS — F17210 Nicotine dependence, cigarettes, uncomplicated: Secondary | ICD-10-CM

## 2019-03-22 DIAGNOSIS — I10 Essential (primary) hypertension: Secondary | ICD-10-CM

## 2019-03-22 DIAGNOSIS — Z862 Personal history of diseases of the blood and blood-forming organs and certain disorders involving the immune mechanism: Secondary | ICD-10-CM

## 2019-03-22 LAB — CBC
HCT: 37.5 % (ref 36.0–46.0)
Hemoglobin: 12.5 g/dL (ref 12.0–15.0)
MCHC: 33.4 g/dL (ref 30.0–36.0)
MCV: 89.6 fl (ref 78.0–100.0)
Platelets: 227 10*3/uL (ref 150.0–400.0)
RBC: 4.19 Mil/uL (ref 3.87–5.11)
RDW: 13.7 % (ref 11.5–15.5)
WBC: 7 10*3/uL (ref 4.0–10.5)

## 2019-03-22 LAB — BASIC METABOLIC PANEL
BUN: 17 mg/dL (ref 6–23)
CO2: 27 mEq/L (ref 19–32)
Calcium: 9.7 mg/dL (ref 8.4–10.5)
Chloride: 105 mEq/L (ref 96–112)
Creatinine, Ser: 1.18 mg/dL (ref 0.40–1.20)
GFR: 54.64 mL/min — ABNORMAL LOW (ref >60.00)
Glucose, Bld: 90 mg/dL (ref 70–99)
Potassium: 3.5 mEq/L (ref 3.5–5.1)
Sodium: 140 mEq/L (ref 135–145)

## 2019-03-22 LAB — LIPID PANEL
Cholesterol: 182 mg/dL (ref 0–200)
HDL: 42.7 mg/dL (ref >39.00)
LDL Cholesterol: 113 mg/dL — ABNORMAL HIGH (ref 0–99)
NonHDL: 139.51
Total CHOL/HDL Ratio: 4
Triglycerides: 131 mg/dL (ref 0.0–149.0)
VLDL: 26.2 mg/dL (ref 0.0–40.0)

## 2019-07-04 ENCOUNTER — Ambulatory Visit: Payer: Medicare HMO | Attending: Internal Medicine

## 2019-07-04 DIAGNOSIS — Z23 Encounter for immunization: Secondary | ICD-10-CM

## 2019-07-04 NOTE — Progress Notes (Signed)
   Covid-19 Vaccination Clinic  Name:  Virginia Watson    MRN: [id number] DOB: Apr 18, 1946  07/04/2019  [name] was observed post Covid-19 immunization for 15 minutes without incident. She was provided with Vaccine Information Sheet and instruction to access the V-Safe system.   [name] was instructed to call 911 with any severe reactions post vaccine: Marland Kitchen Difficulty breathing  . Swelling of face and throat  . A fast heartbeat  . A bad rash all over body  . Dizziness and weakness   Immunizations Administered    Name Date Dose VIS Date Route   Pfizer COVID-19 Vaccine 07/04/2019  8:10 AM 0.3 mL 04/09/2019 Intramuscular   Manufacturer: [place]   Lot: [id number]   [place]: [id number]

## 2019-08-03 ENCOUNTER — Ambulatory Visit: Payer: Medicare HMO

## 2019-08-11 ENCOUNTER — Ambulatory Visit: Payer: Medicare HMO | Attending: Internal Medicine

## 2019-08-11 DIAGNOSIS — Z23 Encounter for immunization: Secondary | ICD-10-CM

## 2019-08-11 NOTE — Progress Notes (Signed)
   Covid-19 Vaccination Clinic  Name:  [name]    MRN: [id number] DOB: 06-10-1945  08/11/2019  [name] was observed post Covid-19 immunization for 15 minutes without incident. She was provided with Vaccine Information Sheet and instruction to access the V-Safe system.   [name] was instructed to call 911 with any severe reactions post vaccine: Marland Kitchen Difficulty breathing  . Swelling of face and throat  . A fast heartbeat  . A bad rash all over body  . Dizziness and weakness   Immunizations Administered    Name Date Dose VIS Date Route   Pfizer COVID-19 Vaccine 08/11/2019 12:50 PM 0.3 mL 04/09/2019 Intramuscular   Manufacturer: [place]   Lot: [id number]   [place]: [id number]

## 2019-09-02 ENCOUNTER — Other Ambulatory Visit: Payer: Self-pay

## 2019-09-02 ENCOUNTER — Telehealth: Payer: Self-pay | Admitting: Family Medicine

## 2019-09-02 DIAGNOSIS — I10 Essential (primary) hypertension: Secondary | ICD-10-CM

## 2019-09-02 MED ORDER — METOPROLOL TARTRATE 25 MG PO TABS: 25.0000 mg | ORAL_TABLET | Freq: Two times a day (BID) | ORAL | 1 refills | Status: DC

## 2019-09-02 MED ORDER — HYDROCHLOROTHIAZIDE 12.5 MG PO CAPS: 12.5000 mg | ORAL_CAPSULE | Freq: Every day | ORAL | 1 refills | Status: DC

## 2019-09-02 MED ORDER — AMLODIPINE BESYLATE 10 MG PO TABS: 10.0000 mg | ORAL_TABLET | Freq: Every day | ORAL | 1 refills | Status: DC

## 2019-09-02 NOTE — Telephone Encounter (Signed)
Rx sent per pt request 

## 2019-09-02 NOTE — Telephone Encounter (Signed)
Medication:Amlodipine, Hydrochlorothiazide, Metoprolo tartrate  Pharmacy: [facility]

## 2019-09-06 ENCOUNTER — Other Ambulatory Visit: Payer: Self-pay | Admitting: Family Medicine

## 2019-09-06 DIAGNOSIS — I10 Essential (primary) hypertension: Secondary | ICD-10-CM

## 2019-12-10 ENCOUNTER — Encounter: Payer: Self-pay | Admitting: Family Medicine

## 2019-12-10 ENCOUNTER — Ambulatory Visit (INDEPENDENT_AMBULATORY_CARE_PROVIDER_SITE_OTHER): Payer: Medicare HMO | Admitting: Family Medicine

## 2019-12-10 ENCOUNTER — Other Ambulatory Visit: Payer: Self-pay

## 2019-12-10 VITALS — BP 110/78 | HR 72 | Temp 98.5°F | Wt 156.0 lb

## 2019-12-10 DIAGNOSIS — R630 Anorexia: Secondary | ICD-10-CM | POA: Diagnosis not present

## 2019-12-10 DIAGNOSIS — Z1211 Encounter for screening for malignant neoplasm of colon: Secondary | ICD-10-CM | POA: Diagnosis not present

## 2019-12-10 DIAGNOSIS — F17210 Nicotine dependence, cigarettes, uncomplicated: Secondary | ICD-10-CM | POA: Diagnosis not present

## 2019-12-10 DIAGNOSIS — R634 Abnormal weight loss: Secondary | ICD-10-CM

## 2019-12-10 NOTE — Patient Instructions (Signed)
High-Protein and High-Calorie Diet Eating high-protein and high-calorie foods can help you to gain weight, heal after an injury, and recover after an illness or surgery. The specific amount of daily protein and calories you need depends on:  Your body weight.  The reason this diet is recommended for you. What is my plan? Generally, a high-protein, high-calorie diet involves:  Eating 250-500 extra calories each day.  Making sure that you get enough of your daily calories from protein. Ask your health care provider how many of your calories should come from protein. Talk with a health care provider, such as a diet and nutrition specialist (dietitian), about how much protein and how many calories you need each day. Follow the diet as directed by your health care provider. What are tips for following this plan?  Preparing meals  Add whole milk, half-and-half, or heavy cream to cereal, pudding, soup, or hot cocoa.  Add whole milk to instant breakfast drinks.  Add peanut butter to oatmeal or smoothies.  Add powdered milk to baked goods, smoothies, or milkshakes.  Add powdered milk, cream, or butter to mashed potatoes.  Add cheese to cooked vegetables.  Make whole-milk yogurt parfaits. Top them with granola, fruit, or nuts.  Add cottage cheese to your fruit.  Add avocado, cheese, or both to sandwiches or salads.  Add meat, poultry, or seafood to rice, pasta, casseroles, salads, and soups.  Use mayonnaise when making egg salad, chicken salad, or tuna salad.  Use peanut butter as a dip for vegetables or as a topping for pretzels, celery, or crackers.  Add beans to casseroles, dips, and spreads.  Add pureed beans to sauces and soups.  Replace calorie-free drinks with calorie-containing drinks, such as milk and fruit juice.  Replace water with milk or heavy cream when making foods such as oatmeal, pudding, or cocoa. General instructions  Ask your health care provider if you  should take a nutritional supplement.  Try to eat six small meals each day instead of three large meals.  Eat a balanced diet. In each meal, include one food that is high in protein.  Keep nutritious snacks available, such as nuts, trail mixes, dried fruit, and yogurt.  If you have kidney disease or diabetes, talk with your health care provider about how much protein is safe for you. Too much protein may put extra stress on your kidneys.  Drink your calories. Choose high-calorie drinks and have them after your meals. What high-protein foods should I eat?  Vegetables Soybeans. Peas. Grains Quinoa. Bulgur wheat. Meats and other proteins Beef, pork, and poultry. Fish and seafood. Eggs. Tofu. Textured vegetable protein (TVP). Peanut butter. Nuts and seeds. Dried beans. Protein powders. Dairy Whole milk. Whole-milk yogurt. Powdered milk. Cheese. Cottage Cheese. Eggnog. Beverages High-protein supplement drinks. Soy milk. Other foods Protein bars. The items listed above may not be a complete list of high-protein foods and beverages. Contact a dietitian for more options. What high-calorie foods should I eat? Fruits Dried fruit. Fruit leather. Canned fruit in syrup. Fruit juice. Avocado. Vegetables Vegetables cooked in oil or butter. Fried potatoes. Grains Pasta. Quick breads. Muffins. Pancakes. Ready-to-eat cereal. Meats and other proteins Peanut butter. Nuts and seeds. Dairy Heavy cream. Whipped cream. Cream cheese. Sour cream. Ice cream. Custard. Pudding. Beverages Meal-replacement beverages. Nutrition shakes. Fruit juice. Sugar-sweetened soft drinks. Seasonings and condiments Salad dressing. Mayonnaise. Alfredo sauce. Fruit preserves or jelly. Honey. Syrup. Sweets and desserts Cake. Cookies. Pie. Pastries. Candy bars. Chocolate. Fats and oils Butter or margarine.   Oil. Gravy. Other foods Meal-replacement bars. The items listed above may not be a complete list of high-calorie  foods and beverages. Contact a dietitian for more options. Summary  A high-protein, high-calorie diet can help you gain weight or heal faster after an injury, illness, or surgery.  To increase your protein and calories, add ingredients such as whole milk, peanut butter, cheese, beans, meat, or seafood to meal items.  To get enough extra calories each day, include high-calorie foods and beverages at each meal.  Adding a high-calorie drink or shake can be an easy way to help you get enough calories each day. Talk with your healthcare provider or dietitian about the best options for you. This information is not intended to replace advice given to you by your health care provider. Make sure you discuss any questions you have with your health care provider. Document Revised: 03/28/2017 Document Reviewed: 02/25/2017 Elsevier Patient Education  2020 [employer].  Steps to Quit Smoking Smoking tobacco is the leading cause of preventable death. It can affect almost every organ in the body. Smoking puts you and people around you at risk for many serious, long-lasting (chronic) diseases. Quitting smoking can be hard, but it is one of the best things that you can do for your health. It is never too late to quit. How do I get ready to quit? When you decide to quit smoking, make a plan to help you succeed. Before you quit:  Pick a date to quit. Set a date within the next 2 weeks to give you time to prepare.  Write down the reasons why you are quitting. Keep this list in places where you will see it often.  Tell your family, friends, and co-workers that you are quitting. Their support is important.  Talk with your doctor about the choices that may help you quit.  Find out if your health insurance will pay for these treatments.  Know the people, places, things, and activities that make you want to smoke (triggers). Avoid them. What first steps can I take to quit smoking?  Throw away all cigarettes  at home, at work, and in your car.  Throw away the things that you use when you smoke, such as ashtrays and lighters.  Clean your car. Make sure to empty the ashtray.  Clean your home, including curtains and carpets. What can I do to help me quit smoking? Talk with your doctor about taking medicines and seeing a counselor at the same time. You are more likely to succeed when you do both.  If you are pregnant or breastfeeding, talk with your doctor about counseling or other ways to quit smoking. Do not take medicine to help you quit smoking unless your doctor tells you to do so. To quit smoking: Quit right away  Quit smoking totally, instead of slowly cutting back on how much you smoke over a period of time.  Go to counseling. You are more likely to quit if you go to counseling sessions regularly. Take medicine You may take medicines to help you quit. Some medicines need a prescription, and some you can buy over-the-counter. Some medicines may contain a drug called nicotine to replace the nicotine in cigarettes. Medicines may:  Help you to stop having the desire to smoke (cravings).  Help to stop the problems that come when you stop smoking (withdrawal symptoms). Your doctor may ask you to use:  Nicotine patches, gum, or lozenges.  Nicotine inhalers or sprays.  Non-nicotine medicine that  is taken by mouth. Find resources Find resources and other ways to help you quit smoking and remain smoke-free after you quit. These resources are most helpful when you use them often. They include:  Online chats with a Social worker.  Phone quitlines.  Printed Furniture conservator/restorer.  Support groups or group counseling.  Text messaging programs.  Mobile phone apps. Use apps on your mobile phone or tablet that can help you stick to your quit plan. There are many free apps for mobile phones and tablets as well as websites. Examples include Quit Guide from the [employer] and smokefree.gov  What things can  I do to make it easier to quit?   Talk to your family and friends. Ask them to support and encourage you.  Call a phone quitline (1-800-QUIT-NOW), reach out to support groups, or work with a Social worker.  Ask people who smoke to not smoke around you.  Avoid places that make you want to smoke, such as: ? Bars. ? Parties. ? Smoke-break areas at work.  Spend time with people who do not smoke.  Lower the stress in your life. Stress can make you want to smoke. Try these things to help your stress: ? Getting regular exercise. ? Doing deep-breathing exercises. ? Doing yoga. ? Meditating. ? Doing a body scan. To do this, close your eyes, focus on one area of your body at a time from head to toe. Notice which parts of your body are tense. Try to relax the muscles in those areas. How will I feel when I quit smoking? Day 1 to 3 weeks Within the first 24 hours, you may start to have some problems that come from quitting tobacco. These problems are very bad 2-3 days after you quit, but they do not often last for more than 2-3 weeks. You may get these symptoms:  Mood swings.  Feeling restless, nervous, angry, or annoyed.  Trouble concentrating.  Dizziness.  Strong desire for high-sugar foods and nicotine.  Weight gain.  Trouble pooping (constipation).  Feeling like you may vomit (nausea).  Coughing or a sore throat.  Changes in how the medicines that you take for other issues work in your body.  Depression.  Trouble sleeping (insomnia). Week 3 and afterward After the first 2-3 weeks of quitting, you may start to notice more positive results, such as:  Better sense of smell and taste.  Less coughing and sore throat.  Slower heart rate.  Lower blood pressure.  Clearer skin.  Better breathing.  Fewer sick days. Quitting smoking can be hard. Do not give up if you fail the first time. Some people need to try a few times before they succeed. Do your best to stick to your  quit plan, and talk with your doctor if you have any questions or concerns. Summary  Smoking tobacco is the leading cause of preventable death. Quitting smoking can be hard, but it is one of the best things that you can do for your health.  When you decide to quit smoking, make a plan to help you succeed.  Quit smoking right away, not slowly over a period of time.  When you start quitting, seek help from your doctor, family, or friends. This information is not intended to replace advice given to you by your health care provider. Make sure you discuss any questions you have with your health care provider. Document Revised: 01/08/2019 Document Reviewed: 07/04/2018 Elsevier Patient Education  [place] Risks of Smoking Smoking cigarettes is  very bad for your health. Tobacco smoke has over 200 known poisons in it. It contains the poisonous gases nitrogen oxide and carbon monoxide. There are over 60 chemicals in tobacco smoke that cause cancer. Smoking is difficult to quit because a chemical in tobacco, called nicotine, causes addiction or dependence. When you smoke and inhale, nicotine is absorbed rapidly into the bloodstream through your lungs. Both inhaled and non-inhaled nicotine may be addictive. What are the risks of cigarette smoke? Cigarette smokers have an increased risk of many serious medical problems, including:  Lung cancer.  Lung disease, such as pneumonia, bronchitis, and emphysema.  Chest pain (angina) and heart attack because the heart is not getting enough oxygen.  Heart disease and peripheral blood vessel disease.  High blood pressure (hypertension).  Stroke.  Oral cancer, including cancer of the lip, mouth, or voice box.  Bladder cancer.  Pancreatic cancer.  Cervical cancer.  Pregnancy complications, including premature birth.  Stillbirths and smaller newborn babies, birth defects, and genetic damage to sperm.  Early menopause.  Lower  estrogen level for women.  Infertility.  Facial wrinkles.  Blindness.  Increased risk of broken bones (fractures).  Senile dementia.  Stomach ulcers and internal bleeding.  Delayed wound healing and increased risk of complications during surgery.  Even smoking lightly shortens your life expectancy by several years. Because of secondhand smoke exposure, children of smokers have an increased risk of the following:  Sudden infant death syndrome (SIDS).  Respiratory infections.  Lung cancer.  Heart disease.  Ear infections. What are the benefits of quitting? There are many health benefits of quitting smoking. Here are some of them:  Within days of quitting smoking, your risk of having a heart attack decreases, your blood flow improves, and your lung capacity improves. Blood pressure, pulse rate, and breathing patterns start returning to normal soon after quitting.  Within months, your lungs may clear up completely.  Quitting for 10 years reduces your risk of developing lung cancer and heart disease to almost that of a nonsmoker.  People who quit may see an improvement in their overall quality of life. How do I quit smoking?     Smoking is an addiction with both physical and psychological effects, and longtime habits can be hard to change. Your health care provider can recommend:  Programs and community resources, which may include group support, education, or talk therapy.  Prescription medicines to help reduce cravings.  Nicotine replacement products, such as patches, gum, and nasal sprays. Use these products only as directed. Do not replace cigarette smoking with electronic cigarettes, which are commonly called e-cigarettes. The safety of e-cigarettes is not known, and some may contain harmful chemicals.  A combination of two or more of these methods. Where to find more information  American Lung Association: www.lung.org  American Cancer Society:  www.cancer.org Summary  Smoking cigarettes is very bad for your health. Cigarette smokers have an increased risk of many serious medical problems, including several cancers, heart disease, and stroke.  Smoking is an addiction with both physical and psychological effects, and longtime habits can be hard to change.  By stopping right away, you can greatly reduce the risk of medical problems for you and your family.  To help you quit smoking, your health care provider can recommend programs, community resources, prescription medicines, and nicotine replacement products such as patches, gum, and nasal sprays. This information is not intended to replace advice given to you by your health care provider. Make sure you discuss  any questions you have with your health care provider. Document Revised: 07/17/2017 Document Reviewed: 04/19/2016 Elsevier Patient Education  2020 [employer].

## 2019-12-10 NOTE — Progress Notes (Signed)
Subjective:    Patient ID: Virginia Watson, female    DOB: Feb 08, 1946, 74 y.o.   MRN: [id number]  No chief complaint on file.   HPI Patient was seen today for acute concern.  Pt endorses loss of appetite and weight loss x1 month or more.  Patient is unsure of weight prior to weight loss, but states she has gone down a pant size.  Patient states she is hungry but cannot eat as much.  Patient denies coughing, SOB, fatigue, changes noticed, blood in stools, fever, chills, abdominal pain.  Pt endorses smoking cigarettes x50 years.  Currently smoking 1 pack/day.  Was smoking 1.5 packs/day.  Pt concerned she was exposed to toxic chemicals after having her home sprayed for bedbugs x2 a few months ago.  Pt unsure of the chemical that was used but states has a bottle of it at home.  Was told her furniture was "soaked" in the chemical and heat was used to kill the bed bugs.  States her appetite change 2 weeks after her home was sprayed.  Pt has never had a colonoscopy.  Past Medical History:  Diagnosis Date  . Allergy   . Hypertension     No Known Allergies  ROS General: Denies fever, chills, night sweats  +weight loss, decreased appetite HEENT: Denies headaches, ear pain, changes in vision, rhinorrhea, sore throat CV: Denies CP, palpitations, SOB, orthopnea Pulm: Denies SOB, cough, wheezing GI: Denies abdominal pain, nausea, vomiting, diarrhea, constipation GU: Denies dysuria, hematuria, frequency, vaginal discharge Msk: Denies muscle cramps, joint pains Neuro: Denies weakness, numbness, tingling Skin: Denies rashes, bruising Psych: Denies depression, anxiety, hallucinations    Objective:    Blood pressure 110/78, pulse 72, temperature 98.5 F (36.9 C), temperature source Oral, weight 156 lb (70.8 kg), SpO2 93 %.   Gen. Pleasant, well-nourished, in no distress, normal affect  HEENT: [place]/AT, face symmetric, conjunctiva clear, no scleral icterus, PERRLA, EOMI, nares patent without  drainage Lungs: no accessory muscle use, CTAB, no wheezes or rales Cardiovascular: RRR, no m/r/g, no peripheral edema Abdomen: BS present, soft, NT/ND, no hepatosplenomegaly. Neuro:  A&Ox3, CN II-XII intact, normal gait Skin:  Warm, no lesions/ rash   Wt Readings from Last 3 Encounters:  12/10/19 156 lb (70.8 kg)  11/13/17 147 lb (66.7 kg)  10/23/17 143 lb (64.9 kg)    Lab Results  Component Value Date   WBC 7.0 03/22/2019   HGB 12.5 03/22/2019   HCT 37.5 03/22/2019   PLT 227.0 03/22/2019   GLUCOSE 90 03/22/2019   CHOL 182 03/22/2019   TRIG 131.0 03/22/2019   HDL 42.70 03/22/2019   LDLCALC 113 (H) 03/22/2019   NA 140 03/22/2019   K 3.5 03/22/2019   CL 105 03/22/2019   CREATININE 1.18 03/22/2019   BUN 17 03/22/2019   CO2 27 03/22/2019    Assessment/Plan:  Weight loss  -Subjective.  Last weight in the chart from 2019 was 144 lbs.  Current weight 156 lbs. -ddx: includes thyroid dysfunction, hepatitis, or malignancy given smoking hx. -Pt to find out chemical used in extermination and notify clinic. -will obtain labs -Referral placed to GI for colonoscopy -Order placed for low-dose CT for lung cancer screening - Plan: Ambulatory referral to Gastroenterology, CMP with eGFR([facility]), CBC with Differential/Platelets, Hemoglobin A1c, TSH, T4, free, Hep C Antibody, Sedimentation Rate, C-reactive Protein, HIV antibody (with reflex)  Decreased appetite  -pt encouraged to eating snacks if not hungry and drink boost or other supplemental drinks -will obtain labs -Given handout -  Plan: Ambulatory referral to Gastroenterology, CMP with eGFR([facility]), CBC with Differential/Platelets, Hemoglobin A1c, TSH, T4, free, Hep C Antibody, Sedimentation Rate, C-reactive Protein, HIV antibody (with reflex)  Cigarette nicotine dependence without complication  -Patient currently smoking 1 pack/day -Patient with 50+ pack year history - Plan: CT CHEST LUNG CA SCREEN LOW DOSE W/O CM  F/u in 2-4 wks  prn  Grier Mitts, MD

## 2019-12-13 LAB — COMPLETE METABOLIC PANEL WITH GFR
AG Ratio: 1.4 (calc) (ref 1.0–2.5)
ALT: 8 U/L (ref 6–29)
AST: 13 U/L (ref 10–35)
Albumin: 4.1 g/dL (ref 3.6–5.1)
Alkaline phosphatase (APISO): 91 U/L (ref 37–153)
BUN/Creatinine Ratio: 17 (calc) (ref 6–22)
BUN: 24 mg/dL (ref 7–25)
CO2: 25 mmol/L (ref 20–32)
Calcium: 10 mg/dL (ref 8.6–10.4)
Chloride: 103 mmol/L (ref 98–110)
Creat: 1.45 mg/dL — ABNORMAL HIGH (ref 0.60–0.93)
GFR, Est African American: 42 mL/min/{1.73_m2} — ABNORMAL LOW (ref >=60)
GFR, Est Non African American: 36 mL/min/{1.73_m2} — ABNORMAL LOW (ref >=60)
Globulin: 3 g/dL (calc) (ref 1.9–3.7)
Glucose, Bld: 84 mg/dL (ref 65–99)
Potassium: 4.1 mmol/L (ref 3.5–5.3)
Sodium: 137 mmol/L (ref 135–146)
Total Bilirubin: 0.5 mg/dL (ref 0.2–1.2)
Total Protein: 7.1 g/dL (ref 6.1–8.1)

## 2019-12-13 LAB — CBC WITH DIFFERENTIAL/PLATELET
Absolute Monocytes: 501 cells/uL (ref 200–950)
Basophils Absolute: 52 cells/uL (ref 0–200)
Basophils Relative: 0.8 %
Eosinophils Absolute: 143 cells/uL (ref 15–500)
Eosinophils Relative: 2.2 %
HCT: 37.4 % (ref 35.0–45.0)
Hemoglobin: 12.7 g/dL (ref 11.7–15.5)
Lymphs Abs: 1931 cells/uL (ref 850–3900)
MCH: 29.7 pg (ref 27.0–33.0)
MCHC: 34 g/dL (ref 32.0–36.0)
MCV: 87.4 fL (ref 80.0–100.0)
MPV: 10.5 fL (ref 7.5–12.5)
Monocytes Relative: 7.7 %
Neutro Abs: 3874 cells/uL (ref 1500–7800)
Neutrophils Relative %: 59.6 %
Platelets: 218 10*3/uL (ref 140–400)
RBC: 4.28 10*6/uL (ref 3.80–5.10)
RDW: 14.2 % (ref 11.0–15.0)
Total Lymphocyte: 29.7 %
WBC: 6.5 10*3/uL (ref 3.8–10.8)

## 2019-12-13 LAB — HEPATITIS C ANTIBODY
Hepatitis C Ab: NONREACTIVE
SIGNAL TO CUT-OFF: 0.01 (ref <1.00)

## 2019-12-13 LAB — HEMOGLOBIN A1C
Hgb A1c MFr Bld: 5.9 % of total Hgb — ABNORMAL HIGH (ref <5.7)
Mean Plasma Glucose: 123 (calc)
eAG (mmol/L): 6.8 (calc)

## 2019-12-13 LAB — SEDIMENTATION RATE: Sed Rate: 33 mm/h — ABNORMAL HIGH (ref 0–30)

## 2019-12-13 LAB — HIV ANTIBODY (ROUTINE TESTING W REFLEX): HIV 1&2 Ab, 4th Generation: NONREACTIVE

## 2019-12-13 LAB — T4, FREE: Free T4: 1.4 ng/dL (ref 0.8–1.8)

## 2019-12-13 LAB — TSH: TSH: 1.28 mIU/L (ref 0.40–4.50)

## 2019-12-13 LAB — C-REACTIVE PROTEIN: CRP: 2.6 mg/L (ref <8.0)

## 2019-12-14 ENCOUNTER — Encounter: Payer: Self-pay | Admitting: Physician Assistant

## 2019-12-16 ENCOUNTER — Telehealth: Payer: Self-pay | Admitting: Family Medicine

## 2019-12-16 NOTE — Telephone Encounter (Signed)
Pt return your call about her labs and will be waiting for a call back.

## 2019-12-16 NOTE — Telephone Encounter (Signed)
Returned pt call, left a message to call the office for her lab results

## 2019-12-18 NOTE — Telephone Encounter (Signed)
Spoke with pt reviewed lab results and recommendations, pt verbalized understanding

## 2020-01-21 ENCOUNTER — Other Ambulatory Visit: Payer: Self-pay

## 2020-01-21 ENCOUNTER — Telehealth: Payer: Self-pay | Admitting: Family Medicine

## 2020-01-21 NOTE — Telephone Encounter (Signed)
Attempted to contact patient x2 for our scheduled Telephone Medicare Wellness visit. Voicemails left with each call unable to reach patient

## 2020-01-27 ENCOUNTER — Encounter: Payer: Self-pay | Admitting: Physician Assistant

## 2020-01-27 ENCOUNTER — Ambulatory Visit (INDEPENDENT_AMBULATORY_CARE_PROVIDER_SITE_OTHER): Payer: Medicare HMO | Admitting: Physician Assistant

## 2020-01-27 VITALS — BP 120/80 | HR 79 | Ht 65.0 in | Wt 152.6 lb

## 2020-01-27 DIAGNOSIS — R634 Abnormal weight loss: Secondary | ICD-10-CM

## 2020-01-27 DIAGNOSIS — R6881 Early satiety: Secondary | ICD-10-CM | POA: Diagnosis not present

## 2020-01-27 DIAGNOSIS — R194 Change in bowel habit: Secondary | ICD-10-CM | POA: Diagnosis not present

## 2020-01-27 MED ORDER — SUTAB 1479-225-188 MG PO TABS: 1.0000 | ORAL_TABLET | Freq: Once | ORAL | 0 refills | Status: AC

## 2020-01-27 MED ORDER — METOCLOPRAMIDE HCL 10 MG PO TABS
ORAL_TABLET | ORAL | 0 refills | Status: DC
Start: 2020-01-27 — End: 2020-03-20

## 2020-01-27 NOTE — Progress Notes (Signed)
Chief Complaint: Weight loss and decreased appetite  HPI:    Virginia Watson is a 74 year old African-American female with past medical history as listed below, who was referred to me by Billie Ruddy, MD for a complaint of weight loss and decreased appetite.      12/10/2019 patient saw her PCP for weight loss.  At that time described a loss of appetite and weight loss for a month or more.  Described going down a pant size.  Described being hungry but could not eat much.  Described a history of cigarette smoking for the past 50 years.  At that time described having her home sprayed for bedbugs 2 months ago and noticed that her appetite changed after this.  At that time a referral was placed to [place].  Patient was also scheduled for a CT of chest due to her smoking history.    12/10/2019 CBC, TSH, CRP and HIV all negative/normal.  CMP with a creatinine of 1.45.  ESR was minimally elevated at 33.    Today, the patient presents to clinic and tells me that for the past 3 to 4 years she has had an appetite which will seem to come and go, but over the past 3 to 4 months she has noticed that this is worse and in fact when she eats she gets full very fast.  Over this time, she has decreased about a pant size but is not exactly sure how many pounds she has lost.  Tells me along with this she has noticed a change in her bowel habits.  Prior to all of this she was constipated but now this seems to be even worse with very small pebble-like stools only 2-3 times a week.  Also describes that she just has no energy and just feels sick.    History of cigarette smoking for the past 50 years.  CT of the chest is pending.    Denies fever, chills, blood in her stool or symptoms that awaken her from sleep.  Past Medical History:  Diagnosis Date  . Allergy   . Hypertension     No past surgical history on file.  Current Outpatient Medications  Medication Sig Dispense Refill  . amLODipine (NORVASC) 10 MG tablet Take 1  tablet (10 mg total) by mouth daily. 90 tablet 1  . hydrochlorothiazide (MICROZIDE) 12.5 MG capsule Take 1 capsule (12.5 mg total) by mouth at bedtime. 90 capsule 1  . metoprolol tartrate (LOPRESSOR) 25 MG tablet Take 1 tablet (25 mg total) by mouth 2 (two) times daily. 180 tablet 1  . nicotine (NICODERM CQ - DOSED IN MG/24 HOURS) 21 mg/24hr patch Place 1 patch (21 mg total) onto the skin daily. (Patient not taking: Reported on 12/10/2019) 45 patch 0   No current facility-administered medications for this visit.    Allergies as of 01/27/2020  . (No Known Allergies)    Family History  Problem Relation Age of Onset  . Alcohol abuse Mother   . Asthma Mother   . Hyperlipidemia Mother   . Alcohol abuse Father   . Heart attack Father   . Depression Sister   . Hypertension Sister   . Drug abuse Brother   . Alcohol abuse Brother     Social History   Socioeconomic History  . Marital status: Single    Spouse name: Not on file  . Number of children: Not on file  . Years of education: Not on file  . Highest education level:  Not on file  Occupational History  . Not on file  Tobacco Use  . Smoking status: Current Every Day Smoker    Packs/day: 0.50    Years: 40.00    Pack years: 20.00  . Smokeless tobacco: Current User  Substance and Sexual Activity  . Alcohol use: Yes    Comment: recovering etoh addict  . Drug use: No  . Sexual activity: Not on file  Other Topics Concern  . Not on file  Social History Narrative  . Not on file   Social Determinants of Health   Financial Resource Strain:   . Difficulty of Paying Living Expenses: Not on file  Food Insecurity:   . Worried About Charity fundraiser in the Last Year: Not on file  . Ran Out of Food in the Last Year: Not on file  Transportation Needs:   . Lack of Transportation (Medical): Not on file  . Lack of Transportation (Non-Medical): Not on file  Physical Activity:   . Days of Exercise per Week: Not on file  . Minutes  of Exercise per Session: Not on file  Stress:   . Feeling of Stress : Not on file  Social Connections:   . Frequency of Communication with Friends and Family: Not on file  . Frequency of Social Gatherings with Friends and Family: Not on file  . Attends Religious Services: Not on file  . Active Member of Clubs or Organizations: Not on file  . Attends Archivist Meetings: Not on file  . Marital Status: Not on file  Intimate Partner Violence:   . Fear of Current or Ex-Partner: Not on file  . Emotionally Abused: Not on file  . Physically Abused: Not on file  . Sexually Abused: Not on file    Review of Systems:    Constitutional: No fever or chills Skin: No rash  Cardiovascular: No chest pain  Respiratory: No SOB Gastrointestinal: See HPI and otherwise negative Genitourinary: No dysuria  Neurological: No headache, dizziness or syncope Musculoskeletal: No new muscle or joint pain Hematologic: No bleeding  Psychiatric: No history of depression or anxiety   Physical Exam:  Vital signs: BP 120/80 (BP Location: Left Arm, Patient Position: Sitting)   Ht 5' 5"  (1.651 m)   Wt 152 lb 9.6 oz (69.2 kg)   BMI 25.39 kg/m   Constitutional:   Pleasant AA female appears to be in NAD, Well developed, Well nourished, alert and cooperative Head:  Normocephalic and atraumatic. Eyes:   PEERL, EOMI. No icterus. Conjunctiva pink. Ears:  Normal auditory acuity. Neck:  Supple Throat: Oral cavity and pharynx without inflammation, swelling or lesion.  Respiratory: Respirations even and unlabored. Lungs clear to auscultation bilaterally.   No wheezes, crackles, or rhonchi.  Cardiovascular: Normal S1, S2. No MRG. Regular rate and rhythm. No peripheral edema, cyanosis or pallor.  Gastrointestinal:  Soft, nondistended, mild epigastric ttp. No rebound or guarding. Normal bowel sounds. No appreciable masses or hepatomegaly. Rectal:  Not performed.  Msk:  Symmetrical without gross deformities.  Without edema, no deformity or joint abnormality.  Neurologic:  Alert and  oriented x4;  grossly normal neurologically.  Skin:   Dry and intact without significant lesions or rashes. Psychiatric: Demonstrates good judgement and reason without abnormal affect or behaviors.  RELEVANT LABS AND IMAGING: CBC    Component Value Date/Time   WBC 6.5 12/10/2019 1450   RBC 4.28 12/10/2019 1450   HGB 12.7 12/10/2019 1450   HCT 37.4 12/10/2019 1450  PLT 218 12/10/2019 1450   MCV 87.4 12/10/2019 1450   MCH 29.7 12/10/2019 1450   MCHC 34.0 12/10/2019 1450   RDW 14.2 12/10/2019 1450   LYMPHSABS 1,931 12/10/2019 1450   EOSABS 143 12/10/2019 1450   BASOSABS 52 12/10/2019 1450    CMP     Component Value Date/Time   NA 137 12/10/2019 1450   K 4.1 12/10/2019 1450   CL 103 12/10/2019 1450   CO2 25 12/10/2019 1450   GLUCOSE 84 12/10/2019 1450   BUN 24 12/10/2019 1450   CREATININE 1.45 (H) 12/10/2019 1450   CALCIUM 10.0 12/10/2019 1450   PROT 7.1 12/10/2019 1450   AST 13 12/10/2019 1450   ALT 8 12/10/2019 1450   BILITOT 0.5 12/10/2019 1450   GFRNONAA 36 (L) 12/10/2019 1450   GFRAA 42 (L) 12/10/2019 1450    Assessment: 1.  Weight loss: Patient has decreased a pant size over the past 2 to 3 months, uncertain of amount of pounds; consider relation to cancer versus other 2.  Early satiety: Over the past 2 to 3 months, feels full very fast and low energy; consider cancer versus gastritis versus other 3.  Change in bowel habits: Towards constipation, could be in relation to decrease in intake  Plan: 1.  Scheduled patient for diagnostic EGD and colonoscopy in the [facility] with Dr. Tarri Glenn as she had sooner availability.  She will follow with her afterwards as her primary GI physician.  Did discuss risks, benefits, limitations and alternatives and the patient agrees to proceed.  She has had both of her Covid vaccines. 2.  Patient will be given a 2-day bowel prep due to history of constipation.  Also  prescribed Reglan 10 mg x 2 to be taken 20 to 30 minutes before each dose of bowel prep. 3.  Patient to follow in clinic per recommendations from Dr. Tarri Glenn after time of procedures  Ellouise Newer, PA-C [facility] 01/27/2020, 10:20 AM  Cc: Billie Ruddy, MD

## 2020-01-27 NOTE — Patient Instructions (Addendum)
If you are age 72 or older, your body mass index should be between 23-30. Your Body mass index is 25.39 kg/m. If this is out of the aforementioned range listed, please consider follow up with your Primary Care Provider.  If you are age 19 or younger, your body mass index should be between 19-25. Your Body mass index is 25.39 kg/m. If this is out of the aformentioned range listed, please consider follow up with your Primary Care Provider.   You have been scheduled for an endoscopy and colonoscopy. Please follow the written instructions given to you at your visit today. Please pick up your prep supplies at the pharmacy within the next 1-3 days. If you use inhalers (even only as needed), please bring them with you on the day of your procedure.  Due to recent changes in healthcare laws, you may see the results of your imaging and laboratory studies on MyChart before your provider has had a chance to review them.  We understand that in some cases there may be results that are confusing or concerning to you. Not all laboratory results come back in the same time frame and the provider may be waiting for multiple results in order to interpret others.  Please give [place] 48 hours in order for your provider to thoroughly review all the results before contacting the office for clarification of your results.

## 2020-01-28 NOTE — Progress Notes (Signed)
Reviewed and agree with management plans. ? ?Sangita Zani L. Maxmilian Trostel, MD, MPH  ?

## 2020-02-09 ENCOUNTER — Encounter: Payer: Self-pay | Admitting: Gastroenterology

## 2020-02-09 ENCOUNTER — Other Ambulatory Visit: Payer: Self-pay

## 2020-02-09 ENCOUNTER — Ambulatory Visit (AMBULATORY_SURGERY_CENTER): Payer: Medicare HMO | Admitting: Gastroenterology

## 2020-02-09 VITALS — BP 111/71 | HR 56 | Temp 97.7°F | Resp 21 | Ht 65.0 in | Wt 152.0 lb

## 2020-02-09 DIAGNOSIS — K5730 Diverticulosis of large intestine without perforation or abscess without bleeding: Secondary | ICD-10-CM

## 2020-02-09 DIAGNOSIS — K269 Duodenal ulcer, unspecified as acute or chronic, without hemorrhage or perforation: Secondary | ICD-10-CM

## 2020-02-09 DIAGNOSIS — R634 Abnormal weight loss: Secondary | ICD-10-CM

## 2020-02-09 DIAGNOSIS — D129 Benign neoplasm of anus and anal canal: Secondary | ICD-10-CM

## 2020-02-09 DIAGNOSIS — K2980 Duodenitis without bleeding: Secondary | ICD-10-CM

## 2020-02-09 DIAGNOSIS — R194 Change in bowel habit: Secondary | ICD-10-CM

## 2020-02-09 DIAGNOSIS — D120 Benign neoplasm of cecum: Secondary | ICD-10-CM

## 2020-02-09 DIAGNOSIS — D122 Benign neoplasm of ascending colon: Secondary | ICD-10-CM | POA: Diagnosis not present

## 2020-02-09 DIAGNOSIS — R6881 Early satiety: Secondary | ICD-10-CM

## 2020-02-09 DIAGNOSIS — K2970 Gastritis, unspecified, without bleeding: Secondary | ICD-10-CM

## 2020-02-09 DIAGNOSIS — K222 Esophageal obstruction: Secondary | ICD-10-CM | POA: Diagnosis not present

## 2020-02-09 DIAGNOSIS — D128 Benign neoplasm of rectum: Secondary | ICD-10-CM

## 2020-02-09 DIAGNOSIS — K259 Gastric ulcer, unspecified as acute or chronic, without hemorrhage or perforation: Secondary | ICD-10-CM | POA: Diagnosis not present

## 2020-02-09 DIAGNOSIS — B9681 Helicobacter pylori [H. pylori] as the cause of diseases classified elsewhere: Secondary | ICD-10-CM | POA: Diagnosis not present

## 2020-02-09 MED ORDER — PANTOPRAZOLE SODIUM 40 MG PO TBEC: 40.0000 mg | DELAYED_RELEASE_TABLET | Freq: Every day | ORAL | 3 refills | Status: DC

## 2020-02-09 MED ORDER — SODIUM CHLORIDE 0.9 % IV SOLN: 500.0000 mL | Freq: Once | INTRAVENOUS | Status: DC

## 2020-02-09 NOTE — Progress Notes (Signed)
Pt's states no medical or surgical changes since previsit or office visit.  No surgical hx  VS CW

## 2020-02-09 NOTE — Progress Notes (Signed)
PT taken to PACU. Monitors in place. VSS. Report given to RN. 

## 2020-02-09 NOTE — Op Note (Signed)
[facility] Patient Name: Virginia Watson Procedure Date: 02/09/2020 10:55 AM MRN: [id number] Endoscopist: Thornton Park MD, MD Age: 74 Referring MD:  Date of Birth: 05/31/1945 Gender: Female Account #: [id number] Procedure:                Upper GI endoscopy Indications:              Weight loss Medicines:                Monitored Anesthesia Care Procedure:                Pre-Anesthesia Assessment:                           - Prior to the procedure, a History and Physical                            was performed, and patient medications and                            allergies were reviewed. The patient's tolerance of                            previous anesthesia was also reviewed. The risks                            and benefits of the procedure and the sedation                            options and risks were discussed with the patient.                            All questions were answered, and informed consent                            was obtained. Prior Anticoagulants: The patient has                            taken no previous anticoagulant or antiplatelet                            agents. ASA Grade Assessment: II - A patient with                            mild systemic disease. After reviewing the risks                            and benefits, the patient was deemed in                            satisfactory condition to undergo the procedure.                           After obtaining informed consent, the endoscope was  passed under direct vision. Throughout the                            procedure, the patient's blood pressure, pulse, and                            oxygen saturations were monitored continuously. The                            Endoscope was introduced through the mouth, and                            advanced to the third part of duodenum. The upper                            GI endoscopy was accomplished  without difficulty.                            The patient tolerated the procedure well. Scope In: Scope Out: Findings:                 The examined esophagus was normal except for a                            widely patent Schatzki's ring.                           Diffuse moderate inflammation characterized by                            erythema, friability and granularity was found in                            the gastric fundus and in the gastric body.                            Biopsies were taken from the antrum, body, and                            fundus with a cold forceps for histology. Estimated                            blood loss was minimal.                           Diffuse mildly erythematous mucosa without active                            bleeding and small ulcers (2-61mm) and with no                            stigmata of bleeding was found in the duodenal  bulb. Biopsies were taken with a cold forceps for                            histology. Estimated blood loss was minimal.                           The cardia and gastric fundus were normal on                            retroflexion.                           The exam was otherwise without abnormality. Complications:            No immediate complications. Estimated blood loss:                            Minimal. Estimated Blood Loss:     Estimated blood loss was minimal. Impression:               - Widely patent Schatzki's ring.                           - Gastritis. Biopsied.                           - Erythematous duodenopathy and small duodenal                            ulcers. Biopsied.                           - The examination was otherwise normal. Recommendation:           - Patient has a contact number available for                            emergencies. The signs and symptoms of potential                            delayed complications were discussed with the                             patient. Return to normal activities tomorrow.                            Written discharge instructions were provided to the                            patient.                           - Resume previous diet.                           - Continue present medications. Start pantoprazole  40 mg BID.                           - No aspirin, ibuprofen, naproxen, or other                            non-steroidal anti-inflammatory drugs.                           - Await pathology results.                           - Proceed with colonoscopy as previously planned. Thornton Park MD, MD 02/09/2020 11:30:37 AM This report has been signed electronically.

## 2020-02-09 NOTE — Op Note (Signed)
[facility] Patient Name: Virginia Watson Procedure Date: 02/09/2020 10:54 AM MRN: [id number] Endoscopist: Thornton Park MD, MD Age: 74 Referring MD:  Date of Birth: Dec 14, 1945 Gender: Female Account #: [id number] Procedure:                Colonoscopy Indications:              This is the patient's first colonoscopy, Weight loss Medicines:                Monitored Anesthesia Care Procedure:                Pre-Anesthesia Assessment:                           - Prior to the procedure, a History and Physical                            was performed, and patient medications and                            allergies were reviewed. The patient's tolerance of                            previous anesthesia was also reviewed. The risks                            and benefits of the procedure and the sedation                            options and risks were discussed with the patient.                            All questions were answered, and informed consent                            was obtained. Prior Anticoagulants: The patient has                            taken no previous anticoagulant or antiplatelet                            agents. ASA Grade Assessment: II - A patient with                            mild systemic disease. After reviewing the risks                            and benefits, the patient was deemed in                            satisfactory condition to undergo the procedure.                           After obtaining informed consent, the colonoscope  was passed under direct vision. Throughout the                            procedure, the patient's blood pressure, pulse, and                            oxygen saturations were monitored continuously. The                            Colonoscope was introduced through the anus and                            advanced to the the cecum, identified by                             appendiceal orifice and ileocecal valve. The                            colonoscopy was performed without difficulty. The                            patient tolerated the procedure well. The quality                            of the bowel preparation was good. The ileocecal                            valve, appendiceal orifice, and rectum were                            photographed. Scope In: 11:08:46 AM Scope Out: 11:23:25 AM Scope Withdrawal Time: 0 hours 11 minutes 2 seconds  Total Procedure Duration: 0 hours 14 minutes 39 seconds  Findings:                 The perianal and digital rectal examinations were                            normal.                           Multiple small and large-mouthed diverticula were                            found in the entire colon.                           A 3 mm polyp was found in the rectum. The polyp was                            sessile. The polyp was removed with a cold snare.                            Resection and retrieval were complete. Estimated  blood loss was minimal.                           A 2 mm polyp was found in the ascending colon. The                            polyp was sessile. The polyp was removed with a                            cold snare. Resection and retrieval were complete.                            Estimated blood loss was minimal.                           A 4 mm polyp was found in the cecum. The polyp was                            flat. The polyp was removed with a cold snare.                            Resection and retrieval were complete. Estimated                            blood loss was minimal.                           The exam was otherwise without abnormality on                            direct and retroflexion views. Complications:            No immediate complications. Estimated blood loss:                            Minimal. Estimated Blood Loss:     Estimated blood  loss was minimal. Impression:               - Diverticulosis in the entire examined colon.                           - One 3 mm polyp in the rectum, removed with a cold                            snare. Resected and retrieved.                           - One 2 mm polyp in the ascending colon, removed                            with a cold snare. Resected and retrieved.                           - One 4 mm polyp in the cecum, removed  with a cold                            snare. Resected and retrieved.                           - The findings do not explain the patient's recent                            weight loss. Recommendation:           - Patient has a contact number available for                            emergencies. The signs and symptoms of potential                            delayed complications were discussed with the                            patient. Return to normal activities tomorrow.                            Written discharge instructions were provided to the                            patient.                           - Follow a high fiber diet. Drink at least 64                            ounces of water daily. Add a daily stool bulking                            agent such as psyllium (an exampled would be                            Metamucil).                           - Continue present medications.                           - Await pathology results.                           - Repeat colonoscopy date to be determined after                            pending pathology results are reviewed for                            surveillance.                           - Emerging evidence supports eating  a diet of                            fruits, vegetables, grains, calcium, and yogurt                            while reducing red meat and alcohol may reduce the                            risk of colon cancer. Thornton Park MD, MD 02/09/2020 11:34:49 AM This  report has been signed electronically.

## 2020-02-09 NOTE — Progress Notes (Signed)
Called to room to assist during endoscopic procedure.  Patient ID and intended procedure confirmed with present staff. Received instructions for my participation in the procedure from the performing physician.  

## 2020-02-09 NOTE — Patient Instructions (Addendum)
Thank you for allowing [place] to care for you today!  Results will be back in 7-10 days.  We will contact you with the results by phone/mail.  Prescription sent to your pharmacy.  Resume other medications and previous diet today.  Return to your normal activities tomorrow.  Avoid aspirin, ibuprofen, naproxen or other non-steroidal anti-inflammatories.  Use Tylenol for pain or fever.  Add a fiber bulking agent like Metameucil or similar.  Try to drink at least 64 ounces of water per day.    YOU HAD AN ENDOSCOPIC PROCEDURE TODAY AT [facility]:   Refer to the procedure report that was given to you for any specific questions about what was found during the examination.  If the procedure report does not answer your questions, please call your gastroenterologist to clarify.  If you requested that your care partner not be given the details of your procedure findings, then the procedure report has been included in a sealed envelope for you to review at your convenience later.  YOU SHOULD EXPECT: Some feelings of bloating in the abdomen. Passage of more gas than usual.  Walking can help get rid of the air that was put into your GI tract during the procedure and reduce the bloating. If you had a lower endoscopy (such as a colonoscopy or flexible sigmoidoscopy) you may notice spotting of blood in your stool or on the toilet paper. If you underwent a bowel prep for your procedure, you may not have a normal bowel movement for a few days.  Please Note:  You might notice some irritation and congestion in your nose or some drainage.  This is from the oxygen used during your procedure.  There is no need for concern and it should clear up in a day or so.  SYMPTOMS TO REPORT IMMEDIATELY:   Following lower endoscopy (colonoscopy or flexible sigmoidoscopy):  Excessive amounts of blood in the stool  Significant tenderness or worsening of abdominal pains  Swelling of the abdomen that is new,  acute  Fever of 100F or higher   Following upper endoscopy (EGD)  Vomiting of blood or coffee ground material  New chest pain or pain under the shoulder blades  Painful or persistently difficult swallowing  New shortness of breath  Fever of 100F or higher  Black, tarry-looking stools  For urgent or emergent issues, a gastroenterologist can be reached at any hour by calling [phone]. Do not use MyChart messaging for urgent concerns.    DIET:  We do recommend a small meal at first, but then you may proceed to your regular diet.  Drink plenty of fluids but you should avoid alcoholic beverages for 24 hours.  ACTIVITY:  You should plan to take it easy for the rest of today and you should NOT DRIVE or use heavy machinery until tomorrow (because of the sedation medicines used during the test).    FOLLOW UP: Our staff will call the number listed on your records 48-72 hours following your procedure to check on you and address any questions or concerns that you may have regarding the information given to you following your procedure. If we do not reach you, we will leave a message.  We will attempt to reach you two times.  During this call, we will ask if you have developed any symptoms of COVID 19. If you develop any symptoms (ie: fever, flu-like symptoms, shortness of breath, cough etc.) before then, please call [phone].  If you test positive for  Covid 19 in the 2 weeks post procedure, please call and report this information to [place].    If any biopsies were taken you will be contacted by phone or by letter within the next 1-3 weeks.  Please call us at [phone] if you have not heard about the biopsies in 3 weeks.    SIGNATURES/CONFIDENTIALITY: You and/or your care partner have signed paperwork which will be entered into your electronic medical record.  These signatures attest to the fact that that the information above on your After Visit Summary has been reviewed and is  understood.  Full responsibility of the confidentiality of this discharge information lies with you and/or your care-partner.

## 2020-02-11 ENCOUNTER — Telehealth: Payer: Self-pay

## 2020-02-11 NOTE — Telephone Encounter (Signed)
Attempted to reach patient for post-procedure f/u call. No answer. Left message for to her to please not hesitate to call us if she has any questions/concerns regarding her care.

## 2020-02-11 NOTE — Telephone Encounter (Signed)
NO ANSWER, MESSAGE LEFT FOR PATIENT. 

## 2020-02-24 ENCOUNTER — Telehealth: Payer: Self-pay

## 2020-02-24 ENCOUNTER — Other Ambulatory Visit: Payer: Self-pay

## 2020-02-24 DIAGNOSIS — A048 Other specified bacterial intestinal infections: Secondary | ICD-10-CM

## 2020-02-24 MED ORDER — BISMUTH 262 MG PO CHEW: 524.0000 mg | CHEWABLE_TABLET | Freq: Four times a day (QID) | ORAL | 0 refills | Status: DC

## 2020-02-24 MED ORDER — TETRACYCLINE HCL 500 MG PO CAPS: 500.0000 mg | ORAL_CAPSULE | Freq: Four times a day (QID) | ORAL | 0 refills | Status: DC

## 2020-02-24 MED ORDER — TALICIA 250-12.5-10 MG PO CPDR: 4.0000 | DELAYED_RELEASE_CAPSULE | Freq: Three times a day (TID) | ORAL | 0 refills | Status: DC

## 2020-02-24 MED ORDER — METRONIDAZOLE 500 MG PO TABS: 500.0000 mg | ORAL_TABLET | Freq: Four times a day (QID) | ORAL | 0 refills | Status: DC

## 2020-02-24 MED ORDER — PANTOPRAZOLE SODIUM 40 MG PO TBEC: 40.0000 mg | DELAYED_RELEASE_TABLET | Freq: Two times a day (BID) | ORAL | 0 refills | Status: DC

## 2020-02-24 NOTE — Telephone Encounter (Signed)
The pharmacy sent a drug change request for patient's tetracycline 500mg  capsules that was sent in today for H.Pylori treatment. I called the pharmacy and it cost $885.00. They could not tell me what her insurance will cover. Just said that this drug is not covered on her plan period. Please advise , thank you.

## 2020-02-24 NOTE — Telephone Encounter (Signed)
Please substitute with Talicia. Thank you.

## 2020-02-24 NOTE — Telephone Encounter (Signed)
[facility] pharmacy has been contacted and the H. Pylori treatment sent in today has been cancelled. I have sent the rx for talicia to [facility] and I will fax them information for them to try and get this medicine for the patient.

## 2020-03-01 ENCOUNTER — Telehealth: Payer: Self-pay

## 2020-03-01 ENCOUNTER — Other Ambulatory Visit: Payer: Self-pay

## 2020-03-01 NOTE — Telephone Encounter (Signed)
Pt called regarding the letter she received in the mail regarding H pylori treatment. Tetracycline was to expensive soe we are currently working on getting her [place]. Working with [facility] for approval, if not approved should be able to get samples of drug for pt.  Spoke with pt and she is aware.

## 2020-03-03 NOTE — Telephone Encounter (Signed)
I think both of you were working on the telicia for this pt. Please let me know if I need to do anything.

## 2020-03-03 NOTE — Telephone Encounter (Signed)
Pt called to inform that her insurance denied coverage for Pat Kocher so she wants to know what else she can have. Pls call her.

## 2020-03-04 ENCOUNTER — Other Ambulatory Visit: Payer: Self-pay | Admitting: Family Medicine

## 2020-03-04 DIAGNOSIS — I10 Essential (primary) hypertension: Secondary | ICD-10-CM

## 2020-03-06 ENCOUNTER — Telehealth: Payer: Self-pay | Admitting: Gastroenterology

## 2020-03-06 NOTE — Telephone Encounter (Signed)
Magda Paganini said the rep is bringing samples today for her.

## 2020-03-06 NOTE — Telephone Encounter (Signed)
Received denial from [facility] via fax for [place]. Per Patti Martinique, CMA, Julieanne Cotton, CMA will contact rep and inform about denial and will try to obtain samples. Provided Julieanne Cotton, CMA with denial letter. States she will notify rep.

## 2020-03-06 NOTE — Telephone Encounter (Signed)
Spoke with Virginia Watson and she is aware that samples of Telicia are being delivered later today. Will call Virginia Watson when samples are here.

## 2020-03-06 NOTE — Telephone Encounter (Signed)
Rep to bring samples today.

## 2020-03-06 NOTE — Telephone Encounter (Signed)
Pt is requesting a call back from a nurse in regards to her:   subsalicylate 788 mg  metronidazole 50 g tetracycline 500 mg  pantoprazole 40 mg  Pt states the combination of all these medications is Talicia, but pt states this is too expensive for her, pt was told by her pharmacist that if the medication were to be prescribed separately that her insurance will cover them.

## 2020-03-06 NOTE — Telephone Encounter (Signed)
Patient informed samples of Talicia here and she is coming to pick up today.

## 2020-03-20 ENCOUNTER — Encounter (HOSPITAL_COMMUNITY): Payer: Self-pay | Admitting: Emergency Medicine

## 2020-03-20 ENCOUNTER — Emergency Department (HOSPITAL_COMMUNITY): Payer: Medicare HMO

## 2020-03-20 ENCOUNTER — Observation Stay (HOSPITAL_COMMUNITY)
Admission: EM | Admit: 2020-03-20 | Discharge: 2020-03-21 | Disposition: A | Discharge status: Home / Self Care | Payer: Medicare HMO | Attending: Internal Medicine | Admitting: Internal Medicine

## 2020-03-20 DIAGNOSIS — Z23 Encounter for immunization: Secondary | ICD-10-CM | POA: Insufficient documentation

## 2020-03-20 DIAGNOSIS — J439 Emphysema, unspecified: Principal | ICD-10-CM

## 2020-03-20 DIAGNOSIS — A048 Other specified bacterial intestinal infections: Secondary | ICD-10-CM

## 2020-03-20 DIAGNOSIS — I10 Essential (primary) hypertension: Secondary | ICD-10-CM | POA: Diagnosis present

## 2020-03-20 DIAGNOSIS — Z79899 Other long term (current) drug therapy: Secondary | ICD-10-CM | POA: Diagnosis not present

## 2020-03-20 DIAGNOSIS — R5383 Other fatigue: Secondary | ICD-10-CM | POA: Diagnosis present

## 2020-03-20 DIAGNOSIS — J9691 Respiratory failure, unspecified with hypoxia: Secondary | ICD-10-CM | POA: Diagnosis not present

## 2020-03-20 DIAGNOSIS — R0902 Hypoxemia: Secondary | ICD-10-CM | POA: Diagnosis not present

## 2020-03-20 DIAGNOSIS — F17210 Nicotine dependence, cigarettes, uncomplicated: Secondary | ICD-10-CM | POA: Diagnosis present

## 2020-03-20 DIAGNOSIS — Z20822 Contact with and (suspected) exposure to covid-19: Secondary | ICD-10-CM | POA: Insufficient documentation

## 2020-03-20 DIAGNOSIS — J9611 Chronic respiratory failure with hypoxia: Secondary | ICD-10-CM | POA: Diagnosis present

## 2020-03-20 DIAGNOSIS — J441 Chronic obstructive pulmonary disease with (acute) exacerbation: Secondary | ICD-10-CM

## 2020-03-20 DIAGNOSIS — R531 Weakness: Secondary | ICD-10-CM

## 2020-03-20 LAB — COMPREHENSIVE METABOLIC PANEL
ALT: 16 U/L (ref 0–44)
AST: 25 U/L (ref 15–41)
Albumin: 3.5 g/dL (ref 3.5–5.0)
Alkaline Phosphatase: 71 U/L (ref 38–126)
Anion gap: 11 (ref 5–15)
BUN: 9 mg/dL (ref 8–23)
CO2: 23 mmol/L (ref 22–32)
Calcium: 9.2 mg/dL (ref 8.9–10.3)
Chloride: 100 mmol/L (ref 98–111)
Creatinine, Ser: 1.5 mg/dL — ABNORMAL HIGH (ref 0.44–1.00)
GFR, Estimated: 37 mL/min — ABNORMAL LOW (ref >60)
Glucose, Bld: 109 mg/dL — ABNORMAL HIGH (ref 70–99)
Potassium: 3 mmol/L — ABNORMAL LOW (ref 3.5–5.1)
Sodium: 134 mmol/L — ABNORMAL LOW (ref 135–145)
Total Bilirubin: 0.9 mg/dL (ref 0.3–1.2)
Total Protein: 7.1 g/dL (ref 6.5–8.1)

## 2020-03-20 LAB — CBC
HCT: 38.8 % (ref 36.0–46.0)
Hemoglobin: 12.1 g/dL (ref 12.0–15.0)
MCH: 28.9 pg (ref 26.0–34.0)
MCHC: 31.2 g/dL (ref 30.0–36.0)
MCV: 92.8 fL (ref 80.0–100.0)
Platelets: 134 10*3/uL — ABNORMAL LOW (ref 150–400)
RBC: 4.18 MIL/uL (ref 3.87–5.11)
RDW: 14.1 % (ref 11.5–15.5)
WBC: 3.5 10*3/uL — ABNORMAL LOW (ref 4.0–10.5)
nRBC: 0 % (ref 0.0–0.2)

## 2020-03-20 LAB — URINALYSIS, ROUTINE W REFLEX MICROSCOPIC
Bacteria, UA: NONE SEEN
Bilirubin Urine: NEGATIVE
Glucose, UA: NEGATIVE mg/dL
Hgb urine dipstick: NEGATIVE
Ketones, ur: NEGATIVE mg/dL
Nitrite: NEGATIVE
Protein, ur: NEGATIVE mg/dL
Specific Gravity, Urine: 1.012 (ref 1.005–1.030)
pH: 6 (ref 5.0–8.0)

## 2020-03-20 LAB — TROPONIN I (HIGH SENSITIVITY)
Troponin I (High Sensitivity): 8 ng/L (ref <18)
Troponin I (High Sensitivity): 9 ng/L (ref <18)

## 2020-03-20 LAB — RESPIRATORY PANEL BY RT PCR (FLU A&B, COVID)
Influenza A by PCR: NEGATIVE
Influenza B by PCR: NEGATIVE
SARS Coronavirus 2 by RT PCR: NEGATIVE

## 2020-03-20 LAB — MAGNESIUM: Magnesium: 2 mg/dL (ref 1.7–2.4)

## 2020-03-20 MED ORDER — IPRATROPIUM-ALBUTEROL 0.5-2.5 (3) MG/3ML IN SOLN: 3.0000 mL | RESPIRATORY_TRACT | Status: DC | PRN

## 2020-03-20 MED ORDER — ACETAMINOPHEN 650 MG RE SUPP: 650.0000 mg | Freq: Four times a day (QID) | RECTAL | Status: DC | PRN

## 2020-03-20 MED ORDER — ALBUTEROL SULFATE HFA 108 (90 BASE) MCG/ACT IN AERS
2.0000 | INHALATION_SPRAY | RESPIRATORY_TRACT | Status: DC | PRN
  Filled 2020-03-20: qty 6.7

## 2020-03-20 MED ORDER — UMECLIDINIUM BROMIDE 62.5 MCG/INH IN AEPB
1.0000 | INHALATION_SPRAY | Freq: Every day | RESPIRATORY_TRACT | Status: DC
  Administered 2020-03-20 – 2020-03-21 (×2): 1 via RESPIRATORY_TRACT
  Filled 2020-03-20 (×2): qty 7

## 2020-03-20 MED ORDER — METOPROLOL TARTRATE 25 MG PO TABS
25.0000 mg | ORAL_TABLET | Freq: Two times a day (BID) | ORAL | Status: DC
  Administered 2020-03-20 – 2020-03-21 (×2): 25 mg via ORAL
  Filled 2020-03-20 (×2): qty 1

## 2020-03-20 MED ORDER — AMLODIPINE BESYLATE 10 MG PO TABS
10.0000 mg | ORAL_TABLET | Freq: Every day | ORAL | Status: DC
  Administered 2020-03-21: 10 mg via ORAL
  Filled 2020-03-20: qty 1

## 2020-03-20 MED ORDER — POTASSIUM CHLORIDE CRYS ER 20 MEQ PO TBCR
40.0000 meq | EXTENDED_RELEASE_TABLET | Freq: Once | ORAL | Status: AC
  Administered 2020-03-20: 40 meq via ORAL
  Filled 2020-03-20: qty 2

## 2020-03-20 MED ORDER — SODIUM CHLORIDE 0.9% FLUSH
3.0000 mL | Freq: Two times a day (BID) | INTRAVENOUS | Status: DC
  Administered 2020-03-21: 3 mL via INTRAVENOUS

## 2020-03-20 MED ORDER — LORATADINE 10 MG PO TABS: 10.0000 mg | ORAL_TABLET | Freq: Every day | ORAL | Status: DC | PRN

## 2020-03-20 MED ORDER — PANTOPRAZOLE SODIUM 40 MG PO TBEC
40.0000 mg | DELAYED_RELEASE_TABLET | Freq: Every day | ORAL | Status: DC
  Administered 2020-03-21: 40 mg via ORAL
  Filled 2020-03-20: qty 1

## 2020-03-20 MED ORDER — ENOXAPARIN SODIUM 40 MG/0.4ML ~~LOC~~ SOLN
40.0000 mg | SUBCUTANEOUS | Status: DC
  Administered 2020-03-20: 40 mg via SUBCUTANEOUS
  Filled 2020-03-20: qty 0.4

## 2020-03-20 MED ORDER — SODIUM CHLORIDE 0.9 % IV BOLUS
1000.0000 mL | Freq: Once | INTRAVENOUS | Status: AC
  Administered 2020-03-20: 1000 mL via INTRAVENOUS

## 2020-03-20 MED ORDER — ACETAMINOPHEN 325 MG PO TABS: 650.0000 mg | ORAL_TABLET | Freq: Four times a day (QID) | ORAL | Status: DC | PRN

## 2020-03-20 MED ORDER — IOHEXOL 350 MG/ML SOLN
50.0000 mL | Freq: Once | INTRAVENOUS | Status: AC | PRN
  Administered 2020-03-20: 50 mL via INTRAVENOUS

## 2020-03-20 MED ORDER — HYDROCHLOROTHIAZIDE 12.5 MG PO CAPS: 12.5000 mg | ORAL_CAPSULE | Freq: Every day | ORAL | Status: DC

## 2020-03-20 MED ORDER — NICOTINE 21 MG/24HR TD PT24
21.0000 mg | MEDICATED_PATCH | Freq: Every day | TRANSDERMAL | Status: DC
  Filled 2020-03-20: qty 1

## 2020-03-20 NOTE — ED Provider Notes (Signed)
[facility] EMERGENCY DEPARTMENT Provider Note   CSN: [id number] Arrival date & time: 03/20/20  0915     History Chief Complaint  Patient presents with  . Fatigue    Virginia Watson is a 74 y.o. female.  The history is provided by the patient and medical records. No language interpreter was used.     74 year old female with significant history of hypertension, presents ED for evaluation of decreased appetite.  Patient report for the past few months she has been steadily losing weight and having increased fatigue with decrease in appetite despite the fact that she wants to eat.  She mention having a colonoscopy done in August and subsequently was diagnosed with having H. pylori gastritis.  On October 28, she was started on a 14-day treatment of bismuth subsalicylate, metronidazole, tetracycline, and pantoprazole.  She has been taking the medication and today is her last day of medication but despite from instruction in taking medication she noticed no improvement of her symptoms.  She is frustrated thus prompting this ER visit.  She does not complain of any fever or chills.  She denies any cold symptoms runny nose sneezing coughing no chest pain no trouble breathing no abdominal pain no dysuria.  She endorsed occasional bouts of nausea without vomiting.  She denies any bowel changes.  She had bouts of panic attack yesterday thinking about disease process.  And she overall feels frustrated she does not have any history of active cancer.  She has been fully vaccinated for COVID-19.  Past Medical History:  Diagnosis Date  . Allergy   . Hypertension     Patient Active Problem List   Diagnosis Date Noted  . Cigarette nicotine dependence without complication 65/46/5035  . Seasonal allergies 10/23/2017  . Hypertension 10/13/2017  . Hypertensive urgency 10/13/2017  . Epistaxis 10/13/2017    History reviewed. No pertinent surgical history.   OB History   No  obstetric history on file.     Family History  Problem Relation Age of Onset  . Alcohol abuse Mother   . Asthma Mother   . Hyperlipidemia Mother   . Alcohol abuse Father   . Heart attack Father   . Depression Sister   . Hypertension Sister   . Colon polyps Sister   . Drug abuse Brother   . Alcohol abuse Brother   . Colon cancer Brother   . Esophageal cancer Neg Hx   . Stomach cancer Neg Hx   . Rectal cancer Neg Hx     Social History   Tobacco Use  . Smoking status: Current Every Day Smoker    Packs/day: 0.50    Years: 40.00    Pack years: 20.00  . Smokeless tobacco: Never Used  Vaping Use  . Vaping Use: Never used  Substance Use Topics  . Alcohol use: Yes    Comment: recovering etoh addict  . Drug use: No    Home Medications Prior to Admission medications   Medication Sig Start Date End Date Taking? Authorizing Provider  amLODipine (NORVASC) 10 MG tablet TAKE 1 TABLET(10 MG) BY MOUTH DAILY 03/06/20   Billie Ruddy, MD  Amoxicill-Rifabutin-Omeprazole (TALICIA) 465-68.1-27 MG CPDR Take 4 capsules by mouth every 8 (eight) hours. 02/24/20   Thornton Park, MD  hydrochlorothiazide (MICROZIDE) 12.5 MG capsule TAKE 1 CAPSULE(12.5 MG) BY MOUTH AT BEDTIME 03/06/20   Billie Ruddy, MD  metoCLOPramide (REGLAN) 10 MG tablet Take one tablet 30 minutes before each half of prep. Patient  not taking: Reported on 02/09/2020 01/27/20   Levin Erp, PA  metoprolol tartrate (LOPRESSOR) 25 MG tablet TAKE 1 TABLET(25 MG) BY MOUTH TWICE DAILY 03/06/20   Billie Ruddy, MD  nicotine (NICODERM CQ - DOSED IN MG/24 HOURS) 21 mg/24hr patch Place 1 patch (21 mg total) onto the skin daily. Patient not taking: Reported on 02/09/2020 11/13/17   Billie Ruddy, MD  pantoprazole (PROTONIX) 40 MG tablet Take 1 tablet (40 mg total) by mouth daily. 02/09/20   Thornton Park, MD    Allergies    Patient has no known allergies.  Review of Systems   Review of Systems  All other  systems reviewed and are negative.   Physical Exam Updated Vital Signs BP 105/78 (BP Location: Right Arm)   Pulse 97   Temp 98.8 F (37.1 C) (Oral)   Resp 20   Ht 5\' 5"  (1.651 m)   Wt 68.9 kg   SpO2 97%   BMI 25.29 kg/m   Physical Exam Vitals and nursing note reviewed.  Constitutional:      Appearance: She is well-developed.     Comments: Patient appears tearful.  HENT:     Head: Atraumatic.  Eyes:     Conjunctiva/sclera: Conjunctivae normal.  Cardiovascular:     Rate and Rhythm: Normal rate and regular rhythm.     Pulses: Normal pulses.     Heart sounds: Normal heart sounds.  Pulmonary:     Effort: Pulmonary effort is normal.     Breath sounds: Normal breath sounds. No wheezing, rhonchi or rales.  Abdominal:     Palpations: Abdomen is soft.     Tenderness: There is no abdominal tenderness.  Musculoskeletal:     Cervical back: Neck supple.  Skin:    Findings: No rash.  Neurological:     Mental Status: She is alert and oriented to person, place, and time.     GCS: GCS eye subscore is 4. GCS verbal subscore is 5. GCS motor subscore is 6.  Psychiatric:        Mood and Affect: Affect is tearful.     ED Results / Procedures / Treatments   Labs (all labs ordered are listed, but only abnormal results are displayed) Labs Reviewed  COMPREHENSIVE METABOLIC PANEL - Abnormal; Notable for the following components:      Result Value   Sodium 134 (*)    Potassium 3.0 (*)    Glucose, Bld 109 (*)    Creatinine, Ser 1.50 (*)    GFR, Estimated 37 (*)    All other components within normal limits  CBC - Abnormal; Notable for the following components:   WBC 3.5 (*)    Platelets 134 (*)    All other components within normal limits  RESPIRATORY PANEL BY RT PCR (FLU A&B, COVID)  URINALYSIS, ROUTINE W REFLEX MICROSCOPIC  TROPONIN I (HIGH SENSITIVITY)  TROPONIN I (HIGH SENSITIVITY)    EKG EKG Interpretation  Date/Time:  Monday March 20 2020 14:45:34 EST Ventricular  Rate:  65 PR Interval:    QRS Duration: 80 QT Interval:  458 QTC Calculation: 477 R Axis:   74 Text Interpretation: Sinus rhythm Nonspecific T abnrm, anterolateral leads Confirmed by Lacretia Leigh ([id number]) on 03/20/2020 4:12:37 PM   Radiology CT Angio Chest PE W and/or Wo Contrast  Result Date: 03/20/2020 CLINICAL DATA:  Acute shortness of breath. EXAM: CT ANGIOGRAPHY CHEST WITH CONTRAST TECHNIQUE: Multidetector CT imaging of the chest was performed using the standard protocol during  bolus administration of intravenous contrast. Multiplanar CT image reconstructions and MIPs were obtained to evaluate the vascular anatomy. CONTRAST:  77mL OMNIPAQUE IOHEXOL 350 MG/ML SOLN COMPARISON:  Chest radiography same day FINDINGS: Cardiovascular: Heart size is normal. No pericardial effusion. Coronary artery calcification is present. Aortic atherosclerotic calcification is present. Pulmonary arterial opacification is excellent. There are no pulmonary emboli. Mediastinum/Nodes: No mass or lymphadenopathy. Lungs/Pleura: Advanced emphysema, upper lobe predominant. No evidence of acute infiltrate, mass, collapse or effusion. Upper Abdomen: Normal Musculoskeletal: Normal Review of the MIP images confirms the above findings. IMPRESSION: 1. No pulmonary emboli or other acute chest pathology. Advanced emphysema, upper lobe predominant. 2. Coronary artery calcification. Aortic Atherosclerosis (ICD10-I70.0) and Emphysema (ICD10-J43.9). Electronically Signed   By: Nelson Chimes M.D.   On: 03/20/2020 16:03   DG Chest Portable 1 View  Result Date: 03/20/2020 CLINICAL DATA:  Fatigue, weight loss. EXAM: PORTABLE CHEST 1 VIEW COMPARISON:  June 05, 2016. FINDINGS: The heart size and mediastinal contours are within normal limits. Both lungs are clear. The visualized skeletal structures are unremarkable. IMPRESSION: No active disease. Electronically Signed   By: Marijo Conception M.D.   On: 03/20/2020 13:41     Procedures Procedures (including critical care time)  Medications Ordered in ED Medications  albuterol (VENTOLIN HFA) 108 (90 Base) MCG/ACT inhaler 2 puff (has no administration in time range)  sodium chloride 0.9 % bolus 1,000 mL (1,000 mLs Intravenous New Bag/Given 03/20/20 1424)  potassium chloride SA (KLOR-CON) CR tablet 40 mEq (40 mEq Oral Given 03/20/20 1425)  iohexol (OMNIPAQUE) 350 MG/ML injection 50 mL (50 mLs Intravenous Contrast Given 03/20/20 1555)    ED Course  I have reviewed the triage vital signs and the nursing notes.  Pertinent labs & imaging results that were available during my care of the patient were reviewed by me and considered in my medical decision making (see chart for details).    MDM Rules/Calculators/A&P                          BP 110/71   Pulse (!) 58   Temp 98.8 F (37.1 C) (Oral)   Resp 12   Ht 5\' 5"  (1.651 m)   Wt 68.9 kg   SpO2 96%   BMI 25.29 kg/m   Final Clinical Impression(s) / ED Diagnoses Final diagnoses:  Hypoxia  Pulmonary emphysema, unspecified emphysema type ([place])  Weakness    Rx / DC Orders ED Discharge Orders    None     1:09 PM Patient recently diagnosed with H. pylori gastritis, was treated with a 14 days treatment of bismuth subsalicylate, metronidazole, tetracycline, and pantoprazole but report no significant improvement of her symptoms.  She does not complain of any active abdominal pain.  Her previous colonoscopy she had several polyps that was removed and you were noted to be tubular adenomas with surveillance colonoscopy in every 3 years.  She was seen evaluate by the bowel gastroenterology, last seen on October 28 according to the notes.  2:29 PM Patient was found to be hypoxic while in the room, when ambulate O2 sats to 80% on room air and she became symptomatic.  Initial chest x-ray unremarkable, however will obtain chest CT angiogram for further evaluation.  Patient placed on supplemental oxygen.  5:05  PM Chest CT angiogram showed no evidence of PE or other acute chest pathology however evidence of advanced emphysema were noted.  On recheck, O2 sats at 85% with ambulation.  Due to finding of emphysema and patient admits to smoking at least a pack of cigarettes a day, will give albuterol inhaler and will consult for admission for hypoxia likely secondary to emphysema.  Covid test is negative.  6:45 PM Appreciate consultation from [facility] hospitalist who agrees to see admit patient for labs for her hypoxia.  Virginia Watson was evaluated in Emergency Department on 03/20/2020 for the symptoms described in the history of present illness. She was evaluated in the context of the global COVID-19 pandemic, which necessitated consideration that the patient might be at risk for infection with the SARS-CoV-2 virus that causes COVID-19. Institutional protocols and algorithms that pertain to the evaluation of patients at risk for COVID-19 are in a state of rapid change based on information released by regulatory bodies including the CDC and federal and state organizations. These policies and algorithms were followed during the patient's care in the ED.    Domenic Moras, PA-C 03/20/20 1846    Lacretia Leigh, MD 03/21/20 [phone]

## 2020-03-20 NOTE — Progress Notes (Signed)
Patient arrived to room 2w12  from ED.  Assessment complete, VS obtained, and Admission database began.

## 2020-03-20 NOTE — ED Notes (Signed)
Pt ambulated with O2 monitoring without oxygen.  When entering the room, pt's O2 sat was 94 on 2L [place].  Pt ambulated several times between the monitor and door. O2 sats dropped down to 81% RA while resting.  Pt then put back on 3L O2 [place] back up to 98% O2 Pt then titrated back down to 2L O2, resting again at 94%.

## 2020-03-20 NOTE — ED Triage Notes (Signed)
Pt reports recent colonoscopy due to decreased appetite, states she was found to have a bacterial infection which she is finishing up medications for. Reports feeling very fatigued for a while, thought that her fatigue was improving but now feeling more fatigued. Again. Also endorses continued minimal appetite, denies n/v/d or constipation.

## 2020-03-20 NOTE — ED Notes (Signed)
Pt sitting at edge of bed eating dinner tray. Tolerating well.

## 2020-03-20 NOTE — ED Notes (Signed)
Pt ambulated for second time. Pt had initial O2 sat of 96% on 2L [place].  Pt was ambulated at RA with sats falling to 85%. At rest on RA pt's sats ranged from 88-92 while talking. Resumed 2L , bringing sats back to 95%  Provider notified of these findings.

## 2020-03-20 NOTE — H&P (Signed)
History and Physical   Virginia Watson KZL:[id number] DOB: 1945/05/26 DOA: 03/20/2020  PCP: Billie Ruddy, MD   Patient coming from: Home  Chief Complaint: Fatigue, continued appetite changes  HPI: Virginia Watson is a 73 y.o. female with medical history significant of hypertension, cigarette use, H pylori who presents with fatigue and continued appetite changes.  Patient reports that 2 months ago she was and had endoscopies and noted to have H. pylori gastritis she has just today completed her course of quadruple therapy.  She states she has continued appetite changes with some sense of feeling very hungry and then losing the taste of the food after just a few bites which causes her to lose her appetite.  Patient also reports recent fatigue.  She denies any particular shortness of breath, chest pain, fevers, cough, abdominal pain.  In the ED she was noted to desaturate with ambulation.  I after a second attempt she continued to saturate to the mid 80s on room air.  And maintain saturations on 2 L.  CT angiogram was done which showed no PE but did show advanced emphysema.  Patient mated for further evaluation and for arrangement of home oxygen.  ED Course: Vital signs significant for hypoxia on room air to 85% with ambulation.  Required 2 L to maintain saturations.  Lab work showed mild hypokalemia 3.0, creatinine 1.5 stable from previous 3 months ago.  LFTs within normal limits, CBC showed a white count 3.5, PLT 134.  Respiratory panel for flu and Covid negative.  Troponin negative x2.  Patient received a liter of fluids and 4 mEq of p.o. potassium.  Review of Systems: As per HPI otherwise all other systems reviewed and are negative.  Past Medical History:  Diagnosis Date   Allergy    Hypertension     History reviewed. No pertinent surgical history.  Social History  reports that she has been smoking. She has a 20.00 pack-year smoking history. She has never used  smokeless tobacco. She reports current alcohol use. She reports that she does not use drugs.  No Known Allergies  Family History  Problem Relation Age of Onset   Alcohol abuse Mother    Asthma Mother    Hyperlipidemia Mother    Alcohol abuse Father    Heart attack Father    Depression Sister    Hypertension Sister    Colon polyps Sister    Drug abuse Brother    Alcohol abuse Brother    Colon cancer Brother    Esophageal cancer Neg Hx    Stomach cancer Neg Hx    Rectal cancer Neg Hx   Reviewed on admission  Prior to Admission medications   Medication Sig Start Date End Date Taking? Authorizing Provider  amLODipine (NORVASC) 10 MG tablet TAKE 1 TABLET(10 MG) BY MOUTH DAILY Patient taking differently: Take 10 mg by mouth daily.  03/06/20  Yes Billie Ruddy, MD  Amoxicill-Rifabutin-Omeprazole (TALICIA) 390-30.0-92 MG CPDR Take 4 capsules by mouth every 8 (eight) hours. 02/24/20  Yes Thornton Park, MD  cetirizine (ZYRTEC) 10 MG tablet Take 10 mg by mouth daily as needed for allergies.   Yes [provider]  ibuprofen (ADVIL) 200 MG tablet Take 200-400 mg by mouth every 8 (eight) hours as needed for mild pain.   Yes [provider]  Iron-Vitamins (GERITOL COMPLETE PO) Take 1 tablet by mouth daily.   Yes [provider]  metoprolol tartrate (LOPRESSOR) 25 MG tablet TAKE 1 TABLET(25 MG) BY  MOUTH TWICE DAILY Patient taking differently: Take 25 mg by mouth 2 (two) times daily.  03/06/20  Yes Billie Ruddy, MD  pantoprazole (PROTONIX) 40 MG tablet Take 1 tablet (40 mg total) by mouth daily. 02/09/20  Yes Thornton Park, MD  hydrochlorothiazide (MICROZIDE) 12.5 MG capsule TAKE 1 CAPSULE(12.5 MG) BY MOUTH AT BEDTIME Patient taking differently: Take 12.5 mg by mouth at bedtime.  03/06/20   Billie Ruddy, MD  nicotine (NICODERM CQ - DOSED IN MG/24 HOURS) 21 mg/24hr patch Place 1 patch (21 mg total) onto the skin daily. Patient not taking:  Reported on 02/09/2020 11/13/17   Billie Ruddy, MD    Physical Exam: Vitals:   03/20/20 1615 03/20/20 1700 03/20/20 1800 03/20/20 1830  BP: 127/77 110/78 126/71 (!) 113/96  Pulse: 62 71 64 75  Resp: 15 13 (!) 27 (!) 23  Temp:      TempSrc:      SpO2: 100% 90% 95% 100%  Weight:      Height:       Physical Exam Constitutional:      General: She is not in acute distress.    Appearance: Normal appearance.  HENT:     Head: Normocephalic and atraumatic.     Mouth/Throat:     Mouth: Mucous membranes are moist.     Pharynx: Oropharynx is clear.  Eyes:     Extraocular Movements: Extraocular movements intact.     Pupils: Pupils are equal, round, and reactive to light.  Cardiovascular:     Rate and Rhythm: Normal rate and regular rhythm.     Pulses: Normal pulses.     Heart sounds: Normal heart sounds.  Pulmonary:     Effort: Pulmonary effort is normal. No respiratory distress.     Breath sounds: Normal breath sounds.  Abdominal:     General: Bowel sounds are normal. There is no distension.     Palpations: Abdomen is soft.     Tenderness: There is no abdominal tenderness.  Musculoskeletal:        General: No swelling or deformity.  Skin:    General: Skin is warm and dry.  Neurological:     General: No focal deficit present.     Mental Status: Mental status is at baseline.    Labs on Admission: I have personally reviewed following labs and imaging studies  CBC: Recent Labs  Lab 03/20/20 0932  WBC 3.5*  HGB 12.1  HCT 38.8  MCV 92.8  PLT 134*    Basic Metabolic Panel: Recent Labs  Lab 03/20/20 0932  NA 134*  K 3.0*  CL 100  CO2 23  GLUCOSE 109*  BUN 9  CREATININE 1.50*  CALCIUM 9.2    GFR: Estimated Creatinine Clearance: 33.6 mL/min (A) (by C-G formula based on SCr of 1.5 mg/dL (H)).  Liver Function Tests: Recent Labs  Lab 03/20/20 0932  AST 25  ALT 16  ALKPHOS 71  BILITOT 0.9  PROT 7.1  ALBUMIN 3.5    Urine analysis:    Component Value  Date/Time   COLORURINE YELLOW 03/20/2020 1640   APPEARANCEUR CLEAR 03/20/2020 1640   LABSPEC 1.012 03/20/2020 1640   PHURINE 6.0 03/20/2020 1640   GLUCOSEU NEGATIVE 03/20/2020 1640   HGBUR NEGATIVE 03/20/2020 1640   BILIRUBINUR NEGATIVE 03/20/2020 1640   KETONESUR NEGATIVE 03/20/2020 1640   PROTEINUR NEGATIVE 03/20/2020 1640   NITRITE NEGATIVE 03/20/2020 1640   LEUKOCYTESUR SMALL (A) 03/20/2020 1640    Radiological Exams on Admission: CT  Angio Chest PE W and/or Wo Contrast  Result Date: 03/20/2020 CLINICAL DATA:  Acute shortness of breath. EXAM: CT ANGIOGRAPHY CHEST WITH CONTRAST TECHNIQUE: Multidetector CT imaging of the chest was performed using the standard protocol during bolus administration of intravenous contrast. Multiplanar CT image reconstructions and MIPs were obtained to evaluate the vascular anatomy. CONTRAST:  52mL OMNIPAQUE IOHEXOL 350 MG/ML SOLN COMPARISON:  Chest radiography same day FINDINGS: Cardiovascular: Heart size is normal. No pericardial effusion. Coronary artery calcification is present. Aortic atherosclerotic calcification is present. Pulmonary arterial opacification is excellent. There are no pulmonary emboli. Mediastinum/Nodes: No mass or lymphadenopathy. Lungs/Pleura: Advanced emphysema, upper lobe predominant. No evidence of acute infiltrate, mass, collapse or effusion. Upper Abdomen: Normal Musculoskeletal: Normal Review of the MIP images confirms the above findings. IMPRESSION: 1. No pulmonary emboli or other acute chest pathology. Advanced emphysema, upper lobe predominant. 2. Coronary artery calcification. Aortic Atherosclerosis (ICD10-I70.0) and Emphysema (ICD10-J43.9). Electronically Signed   By: Nelson Chimes M.D.   On: 03/20/2020 16:03   DG Chest Portable 1 View  Result Date: 03/20/2020 CLINICAL DATA:  Fatigue, weight loss. EXAM: PORTABLE CHEST 1 VIEW COMPARISON:  June 05, 2016. FINDINGS: The heart size and mediastinal contours are within normal limits.  Both lungs are clear. The visualized skeletal structures are unremarkable. IMPRESSION: No active disease. Electronically Signed   By: Marijo Conception M.D.   On: 03/20/2020 13:41    EKG: Independently reviewed.  Sinus rhythm T wave flattening/mild inversions in leads V2 V3.  Assessment/Plan Principal Problem:   Respiratory failure with hypoxia (HCC) Active Problems:   Hypertension   Cigarette nicotine dependence without complication   Emphysema lung (HCC)   Helicobacter pylori infection  Emphysema, likely new COPD diagnosis Respiratory failure with hypoxia > Patient to have persistent 2 L oxygen requirement with ambulation.  O2 sats 96% on 2 L and 85% on room air. > Negative for flu and Covid, chest CTA showed advanced emphysema (no PE), history of about 50 pack years. - Observe overnight - Start patient on LAMA, Incruse Ellipta - As needed duo nebs, as needed albuterol - Oxygen therapy as needed to maintain sats greater than 88% - We will need to reambulate with and without oxygen tomorrow - RVP added onto previously collected Covid panel to evaluate for additional viral etiology - Outpatient for PFTs  Cigarette use > Patient with extensive history of tobacco use, pack a day times 50 years. -Counseled on the importance of smoking cessation especially in ED setting of her new diagnosis of emphysema and suspected COPD - Nicotine patch  H.Pylori > Diagnosed with H. pylori and has completed course of quadruple therapy as of today > Reports residual change in taste her food causing reduced appetite. - Restart PPI - Follow-up outpatient  Hypokalemia > Potassium of 3.0 in ED > Received 40 mEq of p.o. potassium in ED - Add additional 40 mEq - Add on magnesium - BMP in a.m.  Hypertension - Continue home hydrochlorothiazide, amlodipine, metoprolol  DVT prophylaxis: Lovenox  Code Status:   Full  Family Communication:  None on admission  Disposition Plan:   Patient is  from:  Home  Anticipated DC to:  Home  Anticipated DC date:  03/21/2020  Anticipated DC barriers: None  Consults called:  None  Admission status:  Observation, MedSurg   Severity of Illness: The appropriate patient status for this patient is OBSERVATION. Observation status is judged to be reasonable and necessary in order to provide the required intensity of service  to ensure the patient's safety. The patient's presenting symptoms, physical exam findings, and initial radiographic and laboratory data in the context of their medical condition is felt to place them at decreased risk for further clinical deterioration. Furthermore, it is anticipated that the patient will be medically stable for discharge from the hospital within 2 midnights of admission. The following factors support the patient status of observation.   " The patient's presenting symptoms include respiratory failure, nausea vomiting. " The physical exam findings include hypoxia on room air. " The initial radiographic and laboratory data are concerning for CT showing advanced emphysema.   Marcelyn Bruins MD [facility]  How to contact the [facility] Attending or Consulting provider [place] or covering provider during after hours [place], for this patient?   1. Check the care team in [facility] and look for a) attending/consulting [facility] provider listed and b) the [facility] team listed 2. Log into [email or web address] and use [place]'s universal password to access. If you do not have the password, please contact the hospital operator. 3. Locate the [facility] provider you are looking for under [facility] and page to a number that you can be directly reached. 4. If you still have difficulty reaching the provider, please page the [facility] (Director on Call) for the Hospitalists listed on amion for assistance.  03/20/2020, 7:19 PM

## 2020-03-20 NOTE — ED Provider Notes (Signed)
Medical screening examination/treatment/procedure(s) were conducted as a shared visit with non-physician practitioner(s) and myself.  I personally evaluated the patient during the encounter.    74 year old who presents with decreased appetite for over a month.  Has been seen and diagnosed with H. pylori.  On exam here, she is alert and in no acute distress.  Will check labs and likely discharge   Lacretia Leigh, MD 03/20/20 1320

## 2020-03-21 ENCOUNTER — Other Ambulatory Visit: Payer: Self-pay

## 2020-03-21 DIAGNOSIS — J9691 Respiratory failure, unspecified with hypoxia: Secondary | ICD-10-CM | POA: Diagnosis not present

## 2020-03-21 DIAGNOSIS — F17210 Nicotine dependence, cigarettes, uncomplicated: Secondary | ICD-10-CM | POA: Diagnosis not present

## 2020-03-21 DIAGNOSIS — J441 Chronic obstructive pulmonary disease with (acute) exacerbation: Secondary | ICD-10-CM | POA: Diagnosis not present

## 2020-03-21 DIAGNOSIS — I10 Essential (primary) hypertension: Secondary | ICD-10-CM

## 2020-03-21 DIAGNOSIS — A048 Other specified bacterial intestinal infections: Secondary | ICD-10-CM

## 2020-03-21 DIAGNOSIS — I159 Secondary hypertension, unspecified: Secondary | ICD-10-CM

## 2020-03-21 DIAGNOSIS — J439 Emphysema, unspecified: Secondary | ICD-10-CM | POA: Diagnosis not present

## 2020-03-21 LAB — BASIC METABOLIC PANEL
Anion gap: 12 (ref 5–15)
BUN: 10 mg/dL (ref 8–23)
CO2: 22 mmol/L (ref 22–32)
Calcium: 8.6 mg/dL — ABNORMAL LOW (ref 8.9–10.3)
Chloride: 104 mmol/L (ref 98–111)
Creatinine, Ser: 1.2 mg/dL — ABNORMAL HIGH (ref 0.44–1.00)
GFR, Estimated: 48 mL/min — ABNORMAL LOW (ref >60)
Glucose, Bld: 94 mg/dL (ref 70–99)
Potassium: 4.2 mmol/L (ref 3.5–5.1)
Sodium: 138 mmol/L (ref 135–145)

## 2020-03-21 LAB — CBC
HCT: 31.4 % — ABNORMAL LOW (ref 36.0–46.0)
Hemoglobin: 10.7 g/dL — ABNORMAL LOW (ref 12.0–15.0)
MCH: 29.8 pg (ref 26.0–34.0)
MCHC: 34.1 g/dL (ref 30.0–36.0)
MCV: 87.5 fL (ref 80.0–100.0)
Platelets: 128 10*3/uL — ABNORMAL LOW (ref 150–400)
RBC: 3.59 MIL/uL — ABNORMAL LOW (ref 3.87–5.11)
RDW: 14 % (ref 11.5–15.5)
WBC: 2.5 10*3/uL — ABNORMAL LOW (ref 4.0–10.5)
nRBC: 0 % (ref 0.0–0.2)

## 2020-03-21 MED ORDER — ALBUTEROL SULFATE HFA 108 (90 BASE) MCG/ACT IN AERS
1.0000 | INHALATION_SPRAY | Freq: Four times a day (QID) | RESPIRATORY_TRACT | Status: DC | PRN
  Filled 2020-03-21: qty 6.7

## 2020-03-21 MED ORDER — ALBUTEROL SULFATE HFA 108 (90 BASE) MCG/ACT IN AERS
1.0000 | INHALATION_SPRAY | Freq: Four times a day (QID) | RESPIRATORY_TRACT | 0 refills | Status: DC | PRN
Start: 2020-03-21 — End: 2020-05-22

## 2020-03-21 MED ORDER — INFLUENZA VAC A&B SA ADJ QUAD 0.5 ML IM PRSY
0.5000 mL | PREFILLED_SYRINGE | INTRAMUSCULAR | Status: AC
  Administered 2020-03-21: 0.5 mL via INTRAMUSCULAR
  Filled 2020-03-21: qty 0.5

## 2020-03-21 NOTE — Plan of Care (Signed)

## 2020-03-21 NOTE — Progress Notes (Addendum)
SATURATION QUALIFICATIONS: (This note is used to comply with regulatory documentation for home oxygen)  Patient Saturations on Room Air at Rest = 97%  Patient Saturations on Room Air while Ambulating = 91%   

## 2020-03-21 NOTE — Discharge Summary (Addendum)
Physician Discharge Summary  Virginia Watson JSH:[id number] DOB: [birth year] DOA: 03/20/2020  PCP: Billie Ruddy, MD  Admit date: 03/20/2020 Discharge date: 03/21/2020  Admitted From: Home Disposition:  Home   Recommendations for Outpatient Follow-up and new medication changes:  1. Follow up with Dr. Volanda Napoleon in 7 days.  2. Ambulatory oxymetry on room air 88%, did not qualify for home 02.  3. Albuterol as needed.   Home Health: no   Equipment/Devices: na  Discharge Condition: stable  CODE STATUS: full  Diet recommendation: heart healthy   Brief/Interim Summary: Virginia Watson was admitted to the hospital with a working diagnosis of acute hypoxic respiratory failure due to COPD exacerbation.  74 year old female with significant past medical history for hypertension, tobacco abuse and H. pylori who presented with fatigue.  She had a recent endoscopy about 2 months ago where she was diagnosed with H. Pylori.  On her initial physical examination her oximetry was in the mid 85% on room air, blood pressure 127/77, heart rate 62, respiratory rate 27, oxygen saturation on supplemental oxygen 90%, her lungs had no rhonchi, heart S1-S2, present, rhythmic, soft abdomen, no lower extremity edema. Sodium 134, potassium 3.0, chloride 100, bicarb 23, glucose 109, BUN 9, creatinine 1.5, white cell count 3.5, hemoglobin 12.1, hematocrit 38.8, platelets 134.  SARS COVID-19 negative.  Urine analysis negative for infection. Chest radiograph with hyperinflation, bibasilar atelectasis, no infiltrates. CT chest with positive emphysematous changes, no pulmonary embolism.  EKG 65 beats per minutes, normal axis, normal intervals, sinus rhythm, no ST segment changes, negative T waves V1-V2.  1.  Acute hypoxic respiratory failure, suspected COPD exacerbation.  Patient had significant improvement of her symptoms, patient will be discharged home with as needed bronchodilator therapy, and smoking  cessation. Outpatient for PFT.  2.  History of H. pylori.  Fatigue and poor appetite, currently improving.  Follow-up with primary care within 7 days.  3.  Tobacco abuse.  Smoking cessation counseling.  4.  Hypertension.  Continue blood pressure control with hydrochlorothiazide amlodipine and metoprolol.  5.  Acute kidney injury with hypokalemia.  Patient received IV fluids, potassium chloride with improvement of kidney function and electrolytes.  At discharge sodium 138, potassium 4.2, chloride 104, bicarb 22, glucose 94, BUN 10, creatinine 1.20.  Discharge Diagnoses:  Principal Problem:   Respiratory failure with hypoxia ([place]) Active Problems:   Hypertension   Cigarette nicotine dependence without complication   Emphysema lung ([facility])   Helicobacter pylori infection   COPD with acute exacerbation [facility])    Discharge Instructions   Allergies as of 03/21/2020   No Known Allergies     Medication List    STOP taking these medications   nicotine 21 mg/24hr patch Commonly known as: NICODERM CQ - dosed in mg/24 hours     TAKE these medications   albuterol 108 (90 Base) MCG/ACT inhaler Commonly known as: VENTOLIN HFA Inhale 1-2 puffs into the lungs every 6 (six) hours as needed for wheezing or shortness of breath.   amLODipine 10 MG tablet Commonly known as: NORVASC TAKE 1 TABLET(10 MG) BY MOUTH DAILY What changed: See the new instructions.   cetirizine 10 MG tablet Commonly known as: ZYRTEC Take 10 mg by mouth daily as needed for allergies.   GERITOL COMPLETE PO Take 1 tablet by mouth daily.   hydrochlorothiazide 12.5 MG capsule Commonly known as: MICROZIDE TAKE 1 CAPSULE(12.5 MG) BY MOUTH AT BEDTIME What changed: See the new instructions.   ibuprofen 200 MG tablet Commonly known  as: ADVIL Take 200-400 mg by mouth every 8 (eight) hours as needed for mild pain.   metoprolol tartrate 25 MG tablet Commonly known as: LOPRESSOR TAKE 1 TABLET(25 MG) BY MOUTH TWICE  DAILY What changed: See the new instructions.   pantoprazole 40 MG tablet Commonly known as: PROTONIX Take 1 tablet (40 mg total) by mouth daily.   Talicia 354-65.6-81 MG Cpdr Generic drug: Amoxicill-Rifabutin-Omeprazole Take 4 capsules by mouth every 8 (eight) hours.            Durable Medical Equipment  (From admission, onward)         Start     Ordered   03/21/20 1413  For home use only DME oxygen  Once       Question Answer Comment  Length of Need 6 Months   Mode or (Route) Nasal cannula   Liters per Minute 2   Frequency Continuous (stationary and portable oxygen unit needed)   Oxygen conserving device Yes   Oxygen delivery system Gas      03/21/20 1412          No Known Allergies      Procedures/Studies: CT Angio Chest PE W and/or Wo Contrast  Result Date: 03/20/2020 CLINICAL DATA:  Acute shortness of breath. EXAM: CT ANGIOGRAPHY CHEST WITH CONTRAST TECHNIQUE: Multidetector CT imaging of the chest was performed using the standard protocol during bolus administration of intravenous contrast. Multiplanar CT image reconstructions and MIPs were obtained to evaluate the vascular anatomy. CONTRAST:  37mL OMNIPAQUE IOHEXOL 350 MG/ML SOLN COMPARISON:  Chest radiography same day FINDINGS: Cardiovascular: Heart size is normal. No pericardial effusion. Coronary artery calcification is present. Aortic atherosclerotic calcification is present. Pulmonary arterial opacification is excellent. There are no pulmonary emboli. Mediastinum/Nodes: No mass or lymphadenopathy. Lungs/Pleura: Advanced emphysema, upper lobe predominant. No evidence of acute infiltrate, mass, collapse or effusion. Upper Abdomen: Normal Musculoskeletal: Normal Review of the MIP images confirms the above findings. IMPRESSION: 1. No pulmonary emboli or other acute chest pathology. Advanced emphysema, upper lobe predominant. 2. Coronary artery calcification. Aortic Atherosclerosis (ICD10-I70.0) and Emphysema  (ICD10-J43.9). Electronically Signed   By: Nelson Chimes M.D.   On: 03/20/2020 16:03   DG Chest Portable 1 View  Result Date: 03/20/2020 CLINICAL DATA:  Fatigue, weight loss. EXAM: PORTABLE CHEST 1 VIEW COMPARISON:  June 05, 2016. FINDINGS: The heart size and mediastinal contours are within normal limits. Both lungs are clear. The visualized skeletal structures are unremarkable. IMPRESSION: No active disease. Electronically Signed   By: Marijo Conception M.D.   On: 03/20/2020 13:41        Subjective: Patient is feeling better, fatigue has been improving, no nausea or vomiting and improved oral intake.   Discharge Exam: Vitals:   03/21/20 0749 03/21/20 1000  BP:  120/70  Pulse:  60  Resp:  16  Temp:  98.1 F (36.7 C)  SpO2: 94% 98%   Vitals:   03/20/20 2129 03/21/20 0516 03/21/20 0749 03/21/20 1000  BP: 105/79 107/79  120/70  Pulse: 65 60  60  Resp: 20 16  16   Temp: 97.8 F (36.6 C) 98 F (36.7 C)  98.1 F (36.7 C)  TempSrc: Oral Oral  Oral  SpO2: 98% 98% 94% 98%  Weight:      Height:        General: Not in pain or dyspnea Neurology: Awake and alert, non focal  E ENT: no pallor, no icterus, oral mucosa moist Cardiovascular: No JVD. S1-S2 present, rhythmic, no  gallops, rubs, or murmurs. No lower extremity edema. Pulmonary: positive breath sounds bilaterally, with wheezing, rhonchi or rales. Gastrointestinal. Abdomen soft and non tender Skin. No rashes Musculoskeletal: no joint deformities   The results of significant diagnostics from this hospitalization (including imaging, microbiology, ancillary and laboratory) are listed below for reference.     Microbiology: Recent Results (from the past 240 hour(s))  Respiratory Panel by RT PCR (Flu A&B, Covid) - Nasopharyngeal Swab     Status: None   Collection Time: 03/20/20  2:34 PM   Specimen: Nasopharyngeal Swab; Nasopharyngeal(NP) swabs in vial transport medium  Result Value Ref Range Status   SARS Coronavirus 2 by  RT PCR NEGATIVE NEGATIVE Final    Comment: (NOTE) SARS-CoV-2 target nucleic acids are NOT DETECTED.  The SARS-CoV-2 RNA is generally detectable in upper respiratoy specimens during the acute phase of infection. The lowest concentration of SARS-CoV-2 viral copies this assay can detect is 131 copies/mL. A negative result does not preclude SARS-Cov-2 infection and should not be used as the sole basis for treatment or other patient management decisions. A negative result may occur with  improper specimen collection/handling, submission of specimen other than nasopharyngeal swab, presence of viral mutation(s) within the areas targeted by this assay, and inadequate number of viral copies (<131 copies/mL). A negative result must be combined with clinical observations, patient history, and epidemiological information. The expected result is Negative.  Fact Sheet for Patients:  PinkCheek.be  Fact Sheet for Healthcare Providers:  GravelBags.it  This test is no t yet approved or cleared by the [place] FDA and  has been authorized for detection and/or diagnosis of SARS-CoV-2 by FDA under an Emergency Use Authorization (EUA). This EUA will remain  in effect (meaning this test can be used) for the duration of the COVID-19 declaration under Section 564(b)(1) of the Act, 21 U.S.C. section 360bbb-3(b)(1), unless the authorization is terminated or revoked sooner.     Influenza A by PCR NEGATIVE NEGATIVE Final   Influenza B by PCR NEGATIVE NEGATIVE Final    Comment: (NOTE) The Xpert Xpress SARS-CoV-2/FLU/RSV assay is intended as an aid in  the diagnosis of influenza from Nasopharyngeal swab specimens and  should not be used as a sole basis for treatment. Nasal washings and  aspirates are unacceptable for Xpert Xpress SARS-CoV-2/FLU/RSV  testing.  Fact Sheet for Patients: PinkCheek.be  Fact Sheet for  Healthcare Providers: GravelBags.it  This test is not yet approved or cleared by the [place] FDA and  has been authorized for detection and/or diagnosis of SARS-CoV-2 by  FDA under an Emergency Use Authorization (EUA). This EUA will remain  in effect (meaning this test can be used) for the duration of the  Covid-19 declaration under Section 564(b)(1) of the Act, 21  U.S.C. section 360bbb-3(b)(1), unless the authorization is  terminated or revoked. Performed at [facility], [place] [address], [place],  [address]      Labs: BNP (last 3 results) No results for input(s): BNP in the last 8760 hours. Basic Metabolic Panel: Recent Labs  Lab 03/20/20 0932 03/20/20 1629 03/21/20 0247  NA 134*  --  138  K 3.0*  --  4.2  CL 100  --  104  CO2 23  --  22  GLUCOSE 109*  --  94  BUN 9  --  10  CREATININE 1.50*  --  1.20*  CALCIUM 9.2  --  8.6*  MG  --  2.0  --    Liver  Function Tests: Recent Labs  Lab 03/20/20 0932  AST 25  ALT 16  ALKPHOS 71  BILITOT 0.9  PROT 7.1  ALBUMIN 3.5   No results for input(s): LIPASE, AMYLASE in the last 168 hours. No results for input(s): AMMONIA in the last 168 hours. CBC: Recent Labs  Lab 03/20/20 0932 03/21/20 0247  WBC 3.5* 2.5*  HGB 12.1 10.7*  HCT 38.8 31.4*  MCV 92.8 87.5  PLT 134* 128*   Cardiac Enzymes: No results for input(s): CKTOTAL, CKMB, CKMBINDEX, TROPONINI in the last 168 hours. BNP: Invalid input(s): POCBNP CBG: No results for input(s): GLUCAP in the last 168 hours. D-Dimer No results for input(s): DDIMER in the last 72 hours. Hgb A1c No results for input(s): HGBA1C in the last 72 hours. Lipid Profile No results for input(s): CHOL, HDL, LDLCALC, TRIG, CHOLHDL, LDLDIRECT in the last 72 hours. Thyroid function studies No results for input(s): TSH, T4TOTAL, T3FREE, THYROIDAB in the last 72 hours.  Invalid input(s): FREET3 Anemia work up No results for input(s):  VITAMINB12, FOLATE, FERRITIN, TIBC, IRON, RETICCTPCT in the last 72 hours. Urinalysis    Component Value Date/Time   COLORURINE YELLOW 03/20/2020 1640   APPEARANCEUR CLEAR 03/20/2020 1640   LABSPEC 1.012 03/20/2020 1640   PHURINE 6.0 03/20/2020 1640   GLUCOSEU NEGATIVE 03/20/2020 1640   HGBUR NEGATIVE 03/20/2020 1640   BILIRUBINUR NEGATIVE 03/20/2020 1640   KETONESUR NEGATIVE 03/20/2020 1640   PROTEINUR NEGATIVE 03/20/2020 1640   NITRITE NEGATIVE 03/20/2020 1640   LEUKOCYTESUR SMALL (A) 03/20/2020 1640   Sepsis Labs Invalid input(s): PROCALCITONIN,  WBC,  LACTICIDVEN Microbiology Recent Results (from the past 240 hour(s))  Respiratory Panel by RT PCR (Flu A&B, Covid) - Nasopharyngeal Swab     Status: None   Collection Time: 03/20/20  2:34 PM   Specimen: Nasopharyngeal Swab; Nasopharyngeal(NP) swabs in vial transport medium  Result Value Ref Range Status   SARS Coronavirus 2 by RT PCR NEGATIVE NEGATIVE Final    Comment: (NOTE) SARS-CoV-2 target nucleic acids are NOT DETECTED.  The SARS-CoV-2 RNA is generally detectable in upper respiratoy specimens during the acute phase of infection. The lowest concentration of SARS-CoV-2 viral copies this assay can detect is 131 copies/mL. A negative result does not preclude SARS-Cov-2 infection and should not be used as the sole basis for treatment or other patient management decisions. A negative result may occur with  improper specimen collection/handling, submission of specimen other than nasopharyngeal swab, presence of viral mutation(s) within the areas targeted by this assay, and inadequate number of viral copies (<131 copies/mL). A negative result must be combined with clinical observations, patient history, and epidemiological information. The expected result is Negative.  Fact Sheet for Patients:  PinkCheek.be  Fact Sheet for Healthcare Providers:  GravelBags.it  This  test is no t yet approved or cleared by the [place] FDA and  has been authorized for detection and/or diagnosis of SARS-CoV-2 by FDA under an Emergency Use Authorization (EUA). This EUA will remain  in effect (meaning this test can be used) for the duration of the COVID-19 declaration under Section 564(b)(1) of the Act, 21 U.S.C. section 360bbb-3(b)(1), unless the authorization is terminated or revoked sooner.     Influenza A by PCR NEGATIVE NEGATIVE Final   Influenza B by PCR NEGATIVE NEGATIVE Final    Comment: (NOTE) The Xpert Xpress SARS-CoV-2/FLU/RSV assay is intended as an aid in  the diagnosis of influenza from Nasopharyngeal swab specimens and  should not be used as a sole  basis for treatment. Nasal washings and  aspirates are unacceptable for Xpert Xpress SARS-CoV-2/FLU/RSV  testing.  Fact Sheet for Patients: PinkCheek.be  Fact Sheet for Healthcare Providers: GravelBags.it  This test is not yet approved or cleared by the [place] FDA and  has been authorized for detection and/or diagnosis of SARS-CoV-2 by  FDA under an Emergency Use Authorization (EUA). This EUA will remain  in effect (meaning this test can be used) for the duration of the  Covid-19 declaration under Section 564(b)(1) of the Act, 21  U.S.C. section 360bbb-3(b)(1), unless the authorization is  terminated or revoked. Performed at [facility], [place] [address] [place], [place] [address]      Time coordinating discharge: 45 minutes  SIGNED:   Tawni Millers, MD  [facility] 03/21/2020, 2:13 PM

## 2020-03-30 ENCOUNTER — Telehealth: Payer: Self-pay | Admitting: Family Medicine

## 2020-03-30 LAB — LAB REPORT - SCANNED
Albumin, Urine POC: 0.5
Albumin/Creatinine Ratio, Urine, POC: 4
Creatinine, POC: 122 mg/dL
EGFR: 52
HM Hepatitis Screen: NEGATIVE

## 2020-03-30 NOTE — Telephone Encounter (Signed)
Left message for patient to call back and schedule Medicare Annual Wellness Visit (AWV) either virtually or in office.   Last AWV  please schedule at anytime with [facility] Nurse Health Advisor 1 or 2   This should be a 45 minute visit. 

## 2020-03-31 NOTE — Telephone Encounter (Signed)
Roman from [facility] is requesting a call back regarding the pt's Virginia Watson, she states she is checking on the status of an appeal for this medication.  CB [phone]

## 2020-03-31 NOTE — Telephone Encounter (Signed)
I called [employer] back and spoke with Thurmond Butts and he closed out the appeal that they had started on Bessies's behalf. I told him we no longer needed to try and get it covered.

## 2020-04-20 ENCOUNTER — Other Ambulatory Visit: Payer: Self-pay | Admitting: Student

## 2020-04-25 ENCOUNTER — Other Ambulatory Visit: Payer: Self-pay | Admitting: Student

## 2020-04-25 DIAGNOSIS — Z1231 Encounter for screening mammogram for malignant neoplasm of breast: Secondary | ICD-10-CM

## 2020-04-25 DIAGNOSIS — R5381 Other malaise: Secondary | ICD-10-CM

## 2020-04-27 ENCOUNTER — Other Ambulatory Visit: Payer: Self-pay | Admitting: Student

## 2020-04-27 DIAGNOSIS — R5381 Other malaise: Secondary | ICD-10-CM

## 2020-04-27 DIAGNOSIS — Z780 Asymptomatic menopausal state: Secondary | ICD-10-CM

## 2020-05-22 ENCOUNTER — Encounter: Payer: Self-pay | Admitting: Internal Medicine

## 2020-05-22 ENCOUNTER — Ambulatory Visit: Payer: Medicare HMO | Admitting: Internal Medicine

## 2020-05-22 ENCOUNTER — Other Ambulatory Visit: Payer: Self-pay

## 2020-05-22 VITALS — BP 126/80 | HR 65 | Ht 65.0 in | Wt 149.2 lb

## 2020-05-22 DIAGNOSIS — F17210 Nicotine dependence, cigarettes, uncomplicated: Secondary | ICD-10-CM

## 2020-05-22 DIAGNOSIS — J432 Centrilobular emphysema: Secondary | ICD-10-CM | POA: Diagnosis not present

## 2020-05-22 MED ORDER — ALBUTEROL SULFATE HFA 108 (90 BASE) MCG/ACT IN AERS: 2.0000 | INHALATION_SPRAY | Freq: Four times a day (QID) | RESPIRATORY_TRACT | 0 refills | Status: DC | PRN

## 2020-05-22 NOTE — Patient Instructions (Addendum)
The patient should have follow up scheduled with myself in 3 months.   Prior to next visit patient should have: Full set of PFTs  Take the albuterol rescue inhaler every 4 to 6 hours as needed for wheezing or shortness of breath. You can also take it 15 minutes before exercise or exertional activity. Side effects include heart racing or pounding, jitters or anxiety. If you have a history of an irregular heart rhythm, it can make this worse. Can also give some patients a hard time sleeping.  Understanding COPD   What is COPD? COPD stands for chronic obstructive pulmonary (lung) disease. COPD is a general term used for several lung diseases.  COPD is an umbrella term and encompasses other  common diseases in this group like chronic bronchitis and emphysema. Chronic asthma may also be included in this group. While some patients with COPD have only chronic bronchitis or emphysema, most patients have a combination of both.  You might hear these terms used in exchange for one another.   COPD adds to the work of the heart. Diseased lungs may reduce the amount of oxygen that goes to the blood. High blood pressure in blood vessels from the heart to the lungs makes it difficult for the heart to pump. Lung disease can also cause the body to produce too many red blood cells which may make the blood thicker and harder to pump.   Patients who have COPD with low oxygen levels may develop an enlarged heart (cor pulmonale). This condition weakens the heart and causes increased shortness of breath and swelling in the legs and feet.   Chronic bronchitis Chronic bronchitis is irritation and inflammation (swelling) of the lining in the bronchial tubes (air passages). The irritation causes coughing and an excess amount of mucus in the airways. The swelling makes it difficult to get air in and out of the lungs. The small, hair-like structures on the inside of the airways (called cilia) may be damaged by the irritation.  The cilia are then unable to help clean mucus from the airways.  Bronchitis is generally considered to be chronic when you have: a productive cough (cough up mucus) and shortness of breath that lasts about 3 months or more each year for 2 or more years in a row. Your doctor may define chronic bronchitis differently.   Emphysema Emphysema is the destruction, or breakdown, of the walls of the alveoli (air sacs) located at the end of the bronchial tubes. The damaged alveoli are not able to exchange oxygen and carbon dioxide between the lungs and the blood. The bronchioles lose their elasticity and collapse when you exhale, trapping air in the lungs. The trapped air keeps fresh air and oxygen from entering the lungs.   Who is affected by COPD? Emphysema and chronic bronchitis affect approximately 16 million people in the [place], or close to 11 percent of the population.   Symptoms of COPD   Shortness of breath   Shortness of breath with mild exercise (walking, using the stairs, etc.)   Chronic, productive cough (with mucus)   A feeling of "tightness" in the chest   Wheezing   What causes COPD? The two primary causes of COPD are cigarette smoking and alpha1-antitrypsin (AAT) deficiency. Air pollution and occupational dusts may also contribute to COPD, especially when the person exposed to these substances is a cigarette smoker.  Cigarette smoke causes COPD by irritating the airways and creating inflammation that narrows the airways, making it more difficult  to breathe. Cigarette smoke also causes the cilia to stop working properly so mucus and trapped particles are not cleaned from the airways. As a result, chronic cough and excess mucus production develop, leading to chronic bronchitis.  In some people, chronic bronchitis and infections can lead to destruction of the small airways, or emphysema.  AAT deficiency, an inherited disorder, can also lead to emphysema. Alpha antitrypsin (AAT) is  a protective material produced in the liver and transported to the lungs to help combat inflammation. When there is not enough of the chemical AAT, the body is no longer protected from an enzyme in the white blood cells.   How is COPD diagnosed?  To diagnose COPD, the physician needs to know: . Do you smoke?  . Have you had chronic exposure to dust or air pollutants?  . Do other members of your family have lung disease?  Marland Kitchen Are you short of breath?  . Do you get short of breath with exercise?  Marland Kitchen Do you have chronic cough and/or wheezing?  Marland Kitchen Do you cough up excess mucus?  To help with the diagnosis, the physician will conduct a thorough physical exam which includes:  1. Listening to your lungs and heart  2. Checking your blood pressure and pulse  3. Examining your nose and throat  4. Checking your feet and ankles for swelling   Laboratory and other tests Several laboratory and other tests are needed to confirm a diagnosis of COPD. These tests may include:  . Chest X-ray to look for lung changes that could be caused by COPD  .  Spirometry and pulmonary function tests (PFTs) to determine lung volume and air flow  . Pulse oximetry to measure the saturation of oxygen in the blood  . Arterial blood gases (ABGs) to determine the amount of oxygen and carbon dioxide in the blood  . Exercise testing to determine if the oxygen level in the blood drops during exercise   Treatment In the beginning stages of COPD, there is minimal shortness of breath that may be noticed only during exercise. As the disease progresses, shortness of breath may worsen and you may need to wear an oxygen device.   To help control other symptoms of COPD, the following treatments and lifestyle changes may be prescribed.  . Quitting smoking  . Avoiding cigarette smoke and other irritants  . Taking medications including: a. bronchodilators b. anti-inflammatory agents c. oxygen d. antibiotics  . Maintaining a healthy diet   . Following a structured exercise program such as pulmonary rehabilitation . Preventing respiratory infections  . Controlling stress   If your COPD progresses, you may be eligible to be evaluated for lung volume reduction surgery or lung transplantation. You may also be eligible to participate in certain clinical trials (research studies). Ask your health care providers about studies being conducted in your hospital.   What is the outlook? Although COPD can not be cured, its symptoms can be treated and your quality of life can be improved. Your prognosis or outlook for the future will depend on how well your lungs are functioning, your symptoms, and how well you respond to and follow your treatment plan.

## 2020-05-22 NOTE — Progress Notes (Signed)
Virginia Watson    [id number]    [birth year]  Primary Care Physician:Banks, Langley Adie, MD  Referring Physician: Cipriano Mile, NP [place],  [place] [address] Reason for Consultation: cough and low oxygen levels. Date of Consultation: 05/22/2020  Chief complaint:   Chief Complaint  Patient presents with  . Consult    Pt is being referred due to having low O2 levels. Pt states she does have complaints of cough which she states is worse in the morning and will get up clear phlegm.     HPI: Virginia Watson is a 75 y.o. woman with everyday tobacco use who presents for new patient evaluation for emphysema and chronic cough. She was hospitalized for COPD exacerbation back in Nov 2021. She was briefly on oxygen and then weaned off at that time.   She has chronic sinus drainage and attributes her coughing to that.  She also has itchy/watery eyes and notes long term allergies. She is not currently taking anything for this.   She has dyspnea with daily activities but can do everything as long as she does it slowly.   She notes back in August 2021 she was having fatigue, decreased appetite and was concerned for weight loss. 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 She is no longer losing weight but has reduced appetite now.  no night sweats.    She was discharged from the hospital with albuterol inhaler - she takes this twice a day scheduled, rather than as needed. She isn't sure what she's supposed to be using it for regarding "prn" instructions.    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  Exposures: lives at home independently.  Smoking: tried quitting in the last year with patches, but ended up taking them off to smoke. Feels like she has more time after stopping working.   Social History   Occupational History  . Not on file  Tobacco Use  . Smoking status: Current Every Day Smoker     Packs/day: 1.50    Years: 50.00    Pack years: 75.00  . Smokeless tobacco: Never Used  . Tobacco comment: currently smoking 1ppd as of 05/22/20  Vaping Use  . Vaping Use: Never used  Substance and Sexual Activity  . Alcohol use: Yes    Comment: recovering etoh addict  . Drug use: No  . Sexual activity: Not on file    Relevant family history: Family History  Problem Relation Age of Onset  . Alcohol abuse Mother   . Asthma Mother   . Hyperlipidemia Mother   . Alcohol abuse Father   . Heart attack Father   . Depression Sister   . Hypertension Sister   . Colon polyps Sister   . Drug abuse Brother   . Alcohol abuse Brother   . Colon cancer Brother   . Esophageal cancer Neg Hx   . Stomach cancer Neg Hx   . Rectal cancer Neg Hx     Past Medical History:  Diagnosis Date  . Allergy   . Hypertension     History reviewed. No pertinent surgical history.   Physical Exam: Blood pressure 126/80, pulse 65, height 5\' 5"  (1.651 m), weight 149 lb 3.2 oz (67.7 kg), SpO2 98 %. Gen:      No acute distress ENT:  no nasal polyps, mucus membranes moist, mild nasal debris Lungs:    Diminished,  No increased respiratory effort, symmetric chest wall excursion,  no wheezes or crackles CV:         Regular rate and rhythm; no murmurs, rubs, or gallops.  No pedal edema Abd:      + bowel sounds; soft, non-tender; no distension MSK: no acute synovitis of DIP or PIP joints, no mechanics hands.  Skin:      Warm and dry; no rashes Neuro: normal speech, no focal facial asymmetry Psych: alert and oriented x3, normal mood and affect   Data Reviewed/Medical Decision Making:  Independent interpretation of tests: Imaging: . Review of patient's CT Angio images revealed upper lobe predominant emphysema. The patient's images have been independently reviewed by me.    PFTs: None on file  Labs:  Lab Results  Component Value Date   WBC 2.5 (L) 03/21/2020   HGB 10.7 (L) 03/21/2020   HCT 31.4 (L)  03/21/2020   MCV 87.5 03/21/2020   PLT 128 (L) 03/21/2020   Lab Results  Component Value Date   NA 138 03/21/2020   K 4.2 03/21/2020   CL 104 03/21/2020   CO2 22 03/21/2020    Immunization status:  Immunization History  Administered Date(s) Administered  . Fluad Quad(high Dose 65+) 03/21/2020  . Influenza Split 03/01/2015  . PFIZER(Purple Top)SARS-COV-2 Vaccination 07/04/2019, 08/11/2019  . Pneumococcal Polysaccharide-23 10/14/2017    . I reviewed prior external note(s) from hospital stay in Nov 2021 . I reviewed the result(s) of the labs and imaging as noted above.  . I have ordered PFTs    Assessment:  COPD with emphysema - worsening symptoms Ongoing tobacco use  Plan/Recommendations: Virginia Watson is 75 y.o. woman with ongoing tobacco use disorder and COPD. We discussed disease management and progression at length today regarding COPD. Instructed her to take albuterol as needed for symptoms of dyspnea, chest tightness, wheezing instead of her current regimen.   She is eligible for LDCT for lung cancer screening and would be due next in Nov 2022.   I personally spent 6 minutes counseling the patient regarding tobacco use disorder.  Patient is symptomatic from tobacco use disorder due to the following condition: COPD.  The patient's response was contemplative.  We discussed nicotine replacement therapy, Wellbutrin.  We identified to gather patient specific barriers to change.  The patient is open to future discussions about tobacco cessation.   I spent 45 minutes in the care of this patient today including pre-charting, chart review, review of results, face-to-face care, coordination of care and communication with consultants etc.).    Return to Care: Return in about 3 months (around 08/20/2020).  Lenice Llamas, MD [facility]  CC: Cipriano Mile, NP

## 2020-05-23 ENCOUNTER — Other Ambulatory Visit (HOSPITAL_COMMUNITY)
Admission: RE | Admit: 2020-05-23 | Discharge: 2020-05-23 | Disposition: A | Discharge status: Home / Self Care | Payer: Medicare Other | Source: Ambulatory Visit | Attending: Internal Medicine | Admitting: Internal Medicine

## 2020-05-23 DIAGNOSIS — Z20822 Contact with and (suspected) exposure to covid-19: Secondary | ICD-10-CM | POA: Diagnosis not present

## 2020-05-24 ENCOUNTER — Other Ambulatory Visit: Payer: Self-pay

## 2020-05-24 DIAGNOSIS — A048 Other specified bacterial intestinal infections: Secondary | ICD-10-CM

## 2020-05-24 LAB — SARS CORONAVIRUS 2 (TAT 6-24 HRS): SARS Coronavirus 2: NEGATIVE

## 2020-05-26 ENCOUNTER — Other Ambulatory Visit: Payer: Self-pay

## 2020-05-26 ENCOUNTER — Ambulatory Visit (INDEPENDENT_AMBULATORY_CARE_PROVIDER_SITE_OTHER): Payer: Medicare Other | Admitting: Internal Medicine

## 2020-05-26 DIAGNOSIS — J432 Centrilobular emphysema: Secondary | ICD-10-CM | POA: Diagnosis not present

## 2020-05-26 LAB — PULMONARY FUNCTION TEST
DL/VA % pred: 41 %
DL/VA: 1.71 ml/min/mmHg/L
DLCO cor % pred: 36 %
DLCO cor: 7.28 ml/min/mmHg
DLCO unc % pred: 36 %
DLCO unc: 7.28 ml/min/mmHg
FEF 25-75 Post: 0.85 L/sec
FEF 25-75 Pre: 0.64 L/sec
FEF2575-%Change-Post: 32 %
FEF2575-%Pred-Post: 50 %
FEF2575-%Pred-Pre: 38 %
FEV1-%Change-Post: 7 %
FEV1-%Pred-Post: 75 %
FEV1-%Pred-Pre: 70 %
FEV1-Post: 1.42 L
FEV1-Pre: 1.32 L
FEV1FVC-%Change-Post: -2 %
FEV1FVC-%Pred-Pre: 82 %
FEV6-%Change-Post: 10 %
FEV6-%Pred-Post: 97 %
FEV6-%Pred-Pre: 88 %
FEV6-Post: 2.27 L
FEV6-Pre: 2.06 L
FEV6FVC-%Change-Post: 0 %
FEV6FVC-%Pred-Post: 101 %
FEV6FVC-%Pred-Pre: 102 %
FVC-%Change-Post: 10 %
FVC-%Pred-Post: 95 %
FVC-%Pred-Pre: 86 %
FVC-Post: 2.32 L
FVC-Pre: 2.09 L
Post FEV1/FVC ratio: 61 %
Post FEV6/FVC ratio: 98 %
Pre FEV1/FVC ratio: 63 %
Pre FEV6/FVC Ratio: 98 %
RV % pred: 128 %
RV: 2.94 L
TLC % pred: 100 %
TLC: 5.22 L

## 2020-05-26 NOTE — Progress Notes (Signed)
PFT done today. 

## 2020-06-20 ENCOUNTER — Encounter: Payer: Self-pay | Admitting: Gastroenterology

## 2020-06-20 ENCOUNTER — Ambulatory Visit: Payer: Medicare Other | Admitting: Gastroenterology

## 2020-06-20 ENCOUNTER — Other Ambulatory Visit (INDEPENDENT_AMBULATORY_CARE_PROVIDER_SITE_OTHER): Payer: Medicare Other

## 2020-06-20 VITALS — BP 110/80 | HR 88 | Ht 65.0 in | Wt 147.0 lb

## 2020-06-20 DIAGNOSIS — R634 Abnormal weight loss: Secondary | ICD-10-CM

## 2020-06-20 DIAGNOSIS — D508 Other iron deficiency anemias: Secondary | ICD-10-CM

## 2020-06-20 DIAGNOSIS — R6881 Early satiety: Secondary | ICD-10-CM

## 2020-06-20 DIAGNOSIS — K2970 Gastritis, unspecified, without bleeding: Secondary | ICD-10-CM | POA: Diagnosis not present

## 2020-06-20 DIAGNOSIS — B9681 Helicobacter pylori [H. pylori] as the cause of diseases classified elsewhere: Secondary | ICD-10-CM

## 2020-06-20 LAB — TRANSFERRIN: Transferrin: 293 mg/dL (ref 212.0–360.0)

## 2020-06-20 LAB — CBC
HCT: 35.3 % — ABNORMAL LOW (ref 36.0–46.0)
Hemoglobin: 12.2 g/dL (ref 12.0–15.0)
MCHC: 34.7 g/dL (ref 30.0–36.0)
MCV: 87.2 fl (ref 78.0–100.0)
Platelets: 207 10*3/uL (ref 150.0–400.0)
RBC: 4.05 Mil/uL (ref 3.87–5.11)
RDW: 14.8 % (ref 11.5–15.5)
WBC: 6.8 10*3/uL (ref 4.0–10.5)

## 2020-06-20 LAB — IRON: Iron: 57 ug/dL (ref 42–145)

## 2020-06-20 LAB — FERRITIN: Ferritin: 50.8 ng/mL (ref 10.0–291.0)

## 2020-06-20 NOTE — Progress Notes (Signed)
Referring Provider: Billie Ruddy, MD Primary Care Physician:  Cipriano Mile, NP  Chief Complaint:  Anorexia   IMPRESSION:  Anorexia with unexplained weight loss: May be related to H pylori gastritis and ulcers. However, other etiologies must be excluded particularly given cigarette history. If H pylori testing is negative, will proceed with cross-sectional imaging. Her renal function may limit her candidacy for contrast, unfortunately.  H. pylori gastritis and duodenal ulcers on EGD 02/09/2020: Treated with 14 days of bismuth subsalicylate, metronidazole, tetracycline, pantoprazole. Felt better after completing treatment but follow-up testing not performed.  Pancytopenia including normocytic anemia - new since 11/2019. Etiology unclear. Will confirm today. Plan hematology consultation if her counts are not improving.   History of colon polyps: 3 tubular adenomas on colonoscopy 02/09/2020. Surveillance colonoscopy recommended 2024  Pancolonic diverticulosis: High fiber diet recommended.   Family history of colon polyps (sister) and Family history of colon cancer (brother): Surveillance colonoscopy interval at least every 5 years.   Widely patent Schatzki's ring: No associated symptoms at this time.   PLAN: - CBC, iron, ferrin, transferrin to follow-up on panctopenia       - Hematology consult if pancytopenia persists or has progressed - H pylori stool antigen - Consider CT abd/pelvis if H pylori testing is negative - Surveillance colonoscopy 2024 - Follow-up in 3 months, earlier if needed  I spent 41 minutes, including in depth chart review, independent review of results, communicating results with the patient directly, face-to-face time with the patient, coordinating care, and ordering studies and medications as appropriate, and documentation.   HPI: Virginia Watson is a 75 y.o. female who returns in follow-up with ongoing poor appetite and weight loss.  She was  initially seen in the office by Ellouise Newer.  She has a history of hypertension, tobacco abuse, COPD with ongoing cigarette use.  EGD 02/09/2020: Widely patent Schatzki's ring, H. pylori gastritis, multiple small duodenal bulb ulcers, peptic duodenitis confirmed by duodenal biopsy Colonoscopy 02/09/2020: 3 tubular adenomas, pancolonic diverticulosis  Completed 14 days of treatment with bismuth subsalicylate, metronidazole, tetracycline, and pantoprazole. Felt like fatigue and appetite improved after completing the treatment.   No recent abdominal imaging.    Recent labs: 12/10/19: normal TSH, HIV, normal liver enzymes, WBC 6.5, hgb 12.7, platelets 218 03/21/2020 show a normal BMP except for creatinine of 1.20, white count 2.5, hemoglobin 10.7, MCV 87.5, RDW 14.0, platelets 128  Today she reports improved appetite and has been trying to eat more. She has continued to lose weight, although wasn't sure how much. Exacerbated by the death of her brother last week because she hasn't been eating like she had.   She has chronic constipation. GI ROS is otherwise negative.   Past Medical History:  Diagnosis Date  . Allergy   . Hypertension     History reviewed. No pertinent surgical history.  Current Outpatient Medications  Medication Sig Dispense Refill  . albuterol (VENTOLIN HFA) 108 (90 Base) MCG/ACT inhaler Inhale 2 puffs into the lungs every 6 (six) hours as needed for wheezing or shortness of breath. 18 g 0  . amLODipine (NORVASC) 10 MG tablet TAKE 1 TABLET(10 MG) BY MOUTH DAILY (Patient taking differently: Take 10 mg by mouth daily.) 90 tablet 1  . atorvastatin (LIPITOR) 10 MG tablet Take 10 mg by mouth daily.    . hydrochlorothiazide (MICROZIDE) 12.5 MG capsule TAKE 1 CAPSULE(12.5 MG) BY MOUTH AT BEDTIME (Patient taking differently: Take 12.5 mg by mouth at bedtime.) 90 capsule 1  .  Iron-Vitamins (GERITOL COMPLETE PO) Take 1 tablet by mouth daily.    . metoprolol tartrate (LOPRESSOR)  25 MG tablet TAKE 1 TABLET(25 MG) BY MOUTH TWICE DAILY (Patient taking differently: Take 25 mg by mouth 2 (two) times daily.) 180 tablet 1  . pantoprazole (PROTONIX) 40 MG tablet Take 1 tablet (40 mg total) by mouth daily. 90 tablet 3   No current facility-administered medications for this visit.    Allergies as of 06/20/2020  . (No Known Allergies)    Family History  Problem Relation Age of Onset  . Alcohol abuse Mother   . Asthma Mother   . Hyperlipidemia Mother   . Alcohol abuse Father   . Heart attack Father   . Depression Sister   . Hypertension Sister   . Colon polyps Sister   . Drug abuse Brother   . Alcohol abuse Brother   . Colon cancer Brother   . Esophageal cancer Neg Hx   . Stomach cancer Neg Hx   . Rectal cancer Neg Hx      Review of Systems: 12 system ROS is negative except as noted above with the additions of allergies, back pain, fatigue. Muscle pains, and insomnia.   Physical Exam: General:   Alert,  well-nourished, pleasant and cooperative in NAD Weight 152 pounds 01/27/20 Weight 147 pounds today Head:  Normocephalic and atraumatic. Eyes:  Sclera clear, no icterus.   Conjunctiva pink. Abdomen:  Soft, nontender, nondistended, normal bowel sounds, no rebound or guarding. No hepatosplenomegaly.   Rectal:  Deferred  Msk:  Symmetrical. No boney deformities LAD: No inguinal or umbilical LAD Extremities:  No clubbing or edema. Neurologic:  Alert and  oriented x4;  grossly nonfocal Skin:  Intact without significant lesions or rashes. Psych:  Alert and cooperative. Normal mood and affect.     Kimberly L. Tarri Glenn, MD, MPH 06/20/2020, 9:24 AM

## 2020-06-20 NOTE — Patient Instructions (Addendum)
It was a pleasure to see you today. Based on our discussion, I am providing you with my recommendations below:  RECOMMENDATION(S):   I would like for you to repeat your stool study to ensure eradication of the Hpylori bacteria. Depending on these results and to further evaluate your symptoms, we may need to consider a CT scan of your abdomen. I would also like to have you complete labs today to evaluate your anemia. Depending on these results, we may need to consider a Hematology consult.  Given the results of your colonoscopy, I would like to repeat in 3 years.  COLONOSCOPY:    We will plan for a repeat colonoscopy in October 2024  LABS:   . Your provider has requested that you go to the basement level for lab work before leaving today. Press "B" on the elevator. The lab is located at the first door on the left as you exit the elevator.  HEALTHCARE LAWS AND MY CHART RESULTS:   . Due to recent changes in healthcare laws, you may see the results of your imaging and laboratory studies on MyChart before your provider has had a chance to review them.  We understand that in some cases there may be results that are confusing or concerning to you. Not all laboratory results come back in the same time frame and the provider may be waiting for multiple results in order to interpret others.  Please give [place] 48 hours in order for your provider to thoroughly review all the results before contacting the office for clarification of your results.   BMI:  . If you are age 16 or older, your body mass index should be between 23-30. Your Body mass index is 24.46 kg/m. If this is out of the aforementioned range listed, please consider follow up with your Primary Care Provider.  Thank you for trusting me with your gastrointestinal care!    Thornton Park, MD, MPH

## 2020-06-28 ENCOUNTER — Other Ambulatory Visit: Payer: Medicare Other

## 2020-06-28 DIAGNOSIS — A048 Other specified bacterial intestinal infections: Secondary | ICD-10-CM | POA: Diagnosis not present

## 2020-06-29 LAB — HELICOBACTER PYLORI  SPECIAL ANTIGEN
MICRO NUMBER:: 11598114
SPECIMEN QUALITY: ADEQUATE

## 2020-07-21 DIAGNOSIS — I10 Essential (primary) hypertension: Secondary | ICD-10-CM | POA: Diagnosis not present

## 2020-07-21 DIAGNOSIS — E46 Unspecified protein-calorie malnutrition: Secondary | ICD-10-CM | POA: Diagnosis not present

## 2020-07-21 DIAGNOSIS — N1831 Chronic kidney disease, stage 3a: Secondary | ICD-10-CM | POA: Diagnosis not present

## 2020-07-21 DIAGNOSIS — K219 Gastro-esophageal reflux disease without esophagitis: Secondary | ICD-10-CM | POA: Diagnosis not present

## 2020-07-21 DIAGNOSIS — Z008 Encounter for other general examination: Secondary | ICD-10-CM | POA: Diagnosis not present

## 2020-07-21 DIAGNOSIS — G8929 Other chronic pain: Secondary | ICD-10-CM | POA: Diagnosis not present

## 2020-07-21 DIAGNOSIS — E785 Hyperlipidemia, unspecified: Secondary | ICD-10-CM | POA: Diagnosis not present

## 2020-07-25 DIAGNOSIS — Z79899 Other long term (current) drug therapy: Secondary | ICD-10-CM | POA: Diagnosis not present

## 2020-07-25 DIAGNOSIS — N1831 Chronic kidney disease, stage 3a: Secondary | ICD-10-CM | POA: Diagnosis not present

## 2020-07-25 LAB — LAB REPORT - SCANNED
EGFR: 41
EGFR: 48

## 2020-07-26 LAB — LAB REPORT - SCANNED: EGFR: 50

## 2021-02-20 ENCOUNTER — Telehealth: Payer: Self-pay

## 2021-02-20 ENCOUNTER — Other Ambulatory Visit: Payer: Self-pay

## 2021-02-20 DIAGNOSIS — B9681 Helicobacter pylori [H. pylori] as the cause of diseases classified elsewhere: Secondary | ICD-10-CM

## 2021-02-20 DIAGNOSIS — K2970 Gastritis, unspecified, without bleeding: Secondary | ICD-10-CM

## 2021-02-20 NOTE — Telephone Encounter (Signed)
Following message received from Dr. Tarri Glenn:  Needs follow-up H pylori testing. Thanks.   Called pt to schedule f/u appt and to remind to complete Hpylori testing PRIOR to appt. LVM requesting returned call. Will continue efforts to reach out to pt.

## 2021-02-21 NOTE — Telephone Encounter (Signed)
SECOND ATTEMPT: ° °LVM requesting returned call. °

## 2021-02-22 NOTE — Telephone Encounter (Signed)
FINAL ATTEMPT:  LVM requesting returned call. Letter mailed

## 2021-02-27 ENCOUNTER — Other Ambulatory Visit: Payer: Self-pay | Admitting: Family Medicine

## 2021-02-27 DIAGNOSIS — I10 Essential (primary) hypertension: Secondary | ICD-10-CM

## 2021-03-14 ENCOUNTER — Other Ambulatory Visit: Payer: Medicare Other

## 2021-04-02 ENCOUNTER — Other Ambulatory Visit: Payer: Medicare Other

## 2021-04-02 ENCOUNTER — Other Ambulatory Visit: Payer: Self-pay | Admitting: Student

## 2021-04-02 DIAGNOSIS — I70213 Atherosclerosis of native arteries of extremities with intermittent claudication, bilateral legs: Secondary | ICD-10-CM

## 2021-04-09 ENCOUNTER — Other Ambulatory Visit: Payer: Medicare Other

## 2021-04-09 DIAGNOSIS — K2970 Gastritis, unspecified, without bleeding: Secondary | ICD-10-CM

## 2021-04-10 LAB — HELICOBACTER PYLORI  SPECIAL ANTIGEN
MICRO NUMBER:: 12744684
SPECIMEN QUALITY: ADEQUATE

## 2021-04-18 ENCOUNTER — Ambulatory Visit
Admission: RE | Admit: 2021-04-18 | Discharge: 2021-04-18 | Disposition: A | Discharge status: Home / Self Care | Payer: Medicare Other | Source: Ambulatory Visit | Attending: Student | Admitting: Student

## 2021-04-18 DIAGNOSIS — I70213 Atherosclerosis of native arteries of extremities with intermittent claudication, bilateral legs: Secondary | ICD-10-CM

## 2021-04-29 DIAGNOSIS — M419 Scoliosis, unspecified: Secondary | ICD-10-CM

## 2021-04-29 DIAGNOSIS — M779 Enthesopathy, unspecified: Secondary | ICD-10-CM

## 2021-04-29 DIAGNOSIS — M5430 Sciatica, unspecified side: Secondary | ICD-10-CM

## 2021-04-29 HISTORY — DX: Enthesopathy, unspecified: M77.9

## 2021-04-29 HISTORY — DX: Sciatica, unspecified side: M54.30

## 2021-04-29 HISTORY — DX: Scoliosis, unspecified: M41.9

## 2021-05-09 ENCOUNTER — Other Ambulatory Visit: Payer: Self-pay

## 2021-05-09 ENCOUNTER — Ambulatory Visit (INDEPENDENT_AMBULATORY_CARE_PROVIDER_SITE_OTHER): Payer: Commercial Managed Care - HMO | Admitting: Vascular Surgery

## 2021-05-09 VITALS — BP 139/89 | HR 61 | Temp 97.3°F | Resp 20 | Ht 65.0 in | Wt 153.0 lb

## 2021-05-09 DIAGNOSIS — M79675 Pain in left toe(s): Secondary | ICD-10-CM

## 2021-05-09 NOTE — Progress Notes (Signed)
Patient ID: Virginia Watson, female   DOB: Sep 16, 1945, 76 y.o.   MRN: [id number]  Reason for Consult: New Patient (Initial Visit)   Referred by Cipriano Mile, NP  Subjective:     HPI:  Virginia Watson is a 76 y.o. female is a current every day smoker.  She also has hypertension.  She denies any diabetes.  She is never had any vascular invention.  She denies any personal or family history of stroke or aneurysm disease.  She does have left toe pain particularly in the middle the night that affects her great toe.  She does not have any symptoms of claudication.  She does not have any tissue loss or ulceration.  She walks without limitation.  Past Medical History:  Diagnosis Date   Allergy    Hypertension    Family History  Problem Relation Age of Onset   Alcohol abuse Mother    Asthma Mother    Hyperlipidemia Mother    Alcohol abuse Father    Heart attack Father    Depression Sister    Hypertension Sister    Colon polyps Sister    Drug abuse Brother    Alcohol abuse Brother    Colon cancer Brother    Esophageal cancer Neg Hx    Stomach cancer Neg Hx    Rectal cancer Neg Hx    No past surgical history on file.  Short Social History:  Social History   Tobacco Use   Smoking status: Every Day    Packs/day: 1.50    Years: 50.00    Pack years: 75.00    Types: Cigarettes   Smokeless tobacco: Never   Tobacco comments:    currently smoking 1ppd as of 05/22/20  Substance Use Topics   Alcohol use: Yes    Comment: recovering etoh addict    No Known Allergies  Current Outpatient Medications  Medication Sig Dispense Refill   albuterol (VENTOLIN HFA) 108 (90 Base) MCG/ACT inhaler Inhale 2 puffs into the lungs every 6 (six) hours as needed for wheezing or shortness of breath. 18 g 0   hydrochlorothiazide (MICROZIDE) 12.5 MG capsule TAKE 1 CAPSULE(12.5 MG) BY MOUTH AT BEDTIME (Patient taking differently: Take 12.5 mg by mouth at bedtime.) 90 capsule 1    Iron-Vitamins (GERITOL COMPLETE PO) Take 1 tablet by mouth daily.     metoprolol tartrate (LOPRESSOR) 25 MG tablet TAKE 1 TABLET(25 MG) BY MOUTH TWICE DAILY (Patient taking differently: Take 25 mg by mouth 2 (two) times daily.) 180 tablet 1   pantoprazole (PROTONIX) 40 MG tablet Take 1 tablet (40 mg total) by mouth daily. 90 tablet 3   amLODipine (NORVASC) 10 MG tablet TAKE 1 TABLET(10 MG) BY MOUTH DAILY (Patient not taking: Reported on 05/09/2021) 90 tablet 1   atorvastatin (LIPITOR) 10 MG tablet Take 10 mg by mouth daily. (Patient not taking: Reported on 05/09/2021)     No current facility-administered medications for this visit.    Review of Systems  Constitutional:  Constitutional negative. HENT: HENT negative.  Eyes: Eyes negative.  Respiratory: Respiratory negative.  Cardiovascular: Cardiovascular negative.  GI: Gastrointestinal negative.  Musculoskeletal: Musculoskeletal negative.       Toe pain left great toe in the middle of the night Skin: Skin negative.  Neurological: Neurological negative. Hematologic: Hematologic/lymphatic negative.  Psychiatric: Psychiatric negative.       Objective:  Objective   Vitals:   05/09/21 1215  BP: 139/89  Pulse: 61  Resp: 20  Temp: Marland Kitchen)  97.3 F (36.3 C)  SpO2: 95%     Physical Exam HENT:     Head: Normocephalic.     Nose:     Comments: Wearing a mask Eyes:     Pupils: Pupils are equal, round, and reactive to light.  Cardiovascular:     Rate and Rhythm: Normal rate.  Pulmonary:     Effort: Pulmonary effort is normal.  Abdominal:     General: Abdomen is flat.     Palpations: Abdomen is soft.  Musculoskeletal:        General: Normal range of motion.     Cervical back: Normal range of motion.  Skin:    General: Skin is warm and dry.     Capillary Refill: Capillary refill takes less than 2 seconds.  Neurological:     Mental Status: She is alert.  Psychiatric:        Mood and Affect: Mood normal.        Behavior: Behavior  normal.    Data: Right Lower Extremity   Resting ABI:  1.07   Resting TBI: 0.82   Post Exercise ABI:0.99   Segmental Pressures: Normal segmental pressures, no significant (20 mmHg) pressure gradient between adjacent segments. Great toe pressure: 99 mmHg   Arterial Waveforms: Normal tri-phasic arterial waveforms.   PVRs: Normal PVRs with maintained waveform amplitude, augmentation and quality.   Left Lower Extremity:   Resting ABI: 1.11   Resting TBI: 0.74   Post Exercise ABI:0.99   Segmental Pressures: Normal segmental pressures, no significant (20 mmHg) pressure gradient between adjacent segments. Great toe pressure: 89 mmHg   Arterial Waveforms: Normal tri-phasic arterial waveforms through the posterior tibial. Monophasic waveform in dorsalis pedis.   PVRs: Normal PVRs with maintained waveform amplitude, augmentation and quality.   Other: Symmetric upper extremity pressures.   IMPRESSION: 1. Borderline bilateral lower extremity peripheral arterial disease suggested post exercise with minimally decreased ABIs.       Assessment/Plan:     76 year old female with borderline PAD with a decrease in her ABIs postexercise.  She does have palpable pulses her skin all appears healthy I do not think her pain is vascular related.  She cannot take aspirin due to nosebleeds.  I have discussed with her smoking cessation.  She can follow-up with me on an as-needed basis.     Waynetta Sandy MD [facility]

## 2021-05-16 ENCOUNTER — Other Ambulatory Visit: Payer: Self-pay | Admitting: Student

## 2021-05-16 DIAGNOSIS — Z780 Asymptomatic menopausal state: Secondary | ICD-10-CM

## 2021-05-16 DIAGNOSIS — Z1231 Encounter for screening mammogram for malignant neoplasm of breast: Secondary | ICD-10-CM

## 2021-05-21 LAB — LAB REPORT - SCANNED: EGFR: 51

## 2021-06-18 ENCOUNTER — Ambulatory Visit
Admission: RE | Admit: 2021-06-18 | Discharge: 2021-06-18 | Disposition: A | Discharge status: Home / Self Care | Payer: Medicare Other | Source: Ambulatory Visit | Attending: Student | Admitting: Student

## 2021-06-18 ENCOUNTER — Other Ambulatory Visit: Payer: Self-pay | Admitting: Student

## 2021-06-18 DIAGNOSIS — G8929 Other chronic pain: Secondary | ICD-10-CM

## 2021-06-18 DIAGNOSIS — M25551 Pain in right hip: Secondary | ICD-10-CM

## 2021-09-07 DIAGNOSIS — M545 Low back pain, unspecified: Secondary | ICD-10-CM | POA: Insufficient documentation

## 2021-09-27 LAB — LAB REPORT - SCANNED: EGFR: 46

## 2021-11-22 ENCOUNTER — Other Ambulatory Visit: Payer: Self-pay | Admitting: Student

## 2021-11-22 DIAGNOSIS — E2839 Other primary ovarian failure: Secondary | ICD-10-CM

## 2021-11-22 DIAGNOSIS — Z87891 Personal history of nicotine dependence: Secondary | ICD-10-CM

## 2021-11-25 LAB — LAB REPORT - SCANNED: EGFR: 46

## 2021-12-04 ENCOUNTER — Telehealth: Payer: Self-pay | Admitting: Gastroenterology

## 2021-12-04 NOTE — Telephone Encounter (Signed)
Patient called in asking to see which medication she is on that helps with acid reduction. At the time of her last OV she was on pantoprazole QD. She states she has been seeing her PCP for poor appetite & unexplained weight loss. She feels that she is not receiving the care she should be, and would like to see Dr. Tarri Glenn. Patient scheduled for follow up on 01/02/22 at 3:40 pm.

## 2021-12-04 NOTE — Telephone Encounter (Signed)
Inbound call from patient stating that she had a colonoscopy and endoscopy in 2021 and Dr. Tarri Glenn gave her like a "Gi cocktail" medication. Patient stated that Dr. Tarri Glenn found an infection and ulcers during the procedure. Patient is seeking advice if that medication was supposed to help with ulcers. Please advise.

## 2021-12-04 NOTE — Telephone Encounter (Signed)
Left message for patient to call back  

## 2021-12-18 ENCOUNTER — Other Ambulatory Visit: Payer: Medicare Other

## 2021-12-20 ENCOUNTER — Ambulatory Visit
Admission: RE | Admit: 2021-12-20 | Discharge: 2021-12-20 | Disposition: A | Discharge status: Home / Self Care | Payer: Medicare Other | Source: Ambulatory Visit | Attending: Student | Admitting: Student

## 2021-12-20 DIAGNOSIS — Z1231 Encounter for screening mammogram for malignant neoplasm of breast: Secondary | ICD-10-CM

## 2021-12-20 DIAGNOSIS — E2839 Other primary ovarian failure: Secondary | ICD-10-CM

## 2022-01-02 ENCOUNTER — Other Ambulatory Visit (INDEPENDENT_AMBULATORY_CARE_PROVIDER_SITE_OTHER): Payer: Medicare Other

## 2022-01-02 ENCOUNTER — Ambulatory Visit (INDEPENDENT_AMBULATORY_CARE_PROVIDER_SITE_OTHER): Payer: Medicare Other | Admitting: Gastroenterology

## 2022-01-02 ENCOUNTER — Encounter: Payer: Self-pay | Admitting: Gastroenterology

## 2022-01-02 VITALS — BP 128/90 | HR 82 | Ht 66.0 in | Wt 157.6 lb

## 2022-01-02 DIAGNOSIS — R634 Abnormal weight loss: Secondary | ICD-10-CM

## 2022-01-02 DIAGNOSIS — B9681 Helicobacter pylori [H. pylori] as the cause of diseases classified elsewhere: Secondary | ICD-10-CM | POA: Diagnosis not present

## 2022-01-02 DIAGNOSIS — K269 Duodenal ulcer, unspecified as acute or chronic, without hemorrhage or perforation: Secondary | ICD-10-CM

## 2022-01-02 DIAGNOSIS — R630 Anorexia: Secondary | ICD-10-CM

## 2022-01-02 LAB — COMPREHENSIVE METABOLIC PANEL
ALT: 10 U/L (ref 0–35)
AST: 14 U/L (ref 0–37)
Albumin: 4.1 g/dL (ref 3.5–5.2)
Alkaline Phosphatase: 63 U/L (ref 39–117)
BUN: 19 mg/dL (ref 6–23)
CO2: 25 mEq/L (ref 19–32)
Calcium: 11.3 mg/dL — ABNORMAL HIGH (ref 8.4–10.5)
Chloride: 106 mEq/L (ref 96–112)
Creatinine, Ser: 1.34 mg/dL — ABNORMAL HIGH (ref 0.40–1.20)
GFR: 39.26 mL/min — ABNORMAL LOW (ref >60.00)
Glucose, Bld: 76 mg/dL (ref 70–99)
Potassium: 4.1 mEq/L (ref 3.5–5.1)
Sodium: 139 mEq/L (ref 135–145)
Total Bilirubin: 0.6 mg/dL (ref 0.2–1.2)
Total Protein: 7.8 g/dL (ref 6.0–8.3)

## 2022-01-02 NOTE — Progress Notes (Signed)
Referring Provider: Cipriano Mile, NP Primary Care Physician:  Cipriano Mile, NP  Chief Complaint:  Anorexia   IMPRESSION:  Ongoing anorexia with prior weight loss: I initially thought this might be related to H pylori gastritis and ulcers. However, other etiologies must be excluded particularly given cigarette history. Thankfully, her weight loss appears to have stabilized with Megace. However, she is concerned about ongoing anorexia. As symptoms persist despite treatment with H pylori, will proceed with cross-sectional imaging and repeat EGD to document resolution of the ulcers. Her renal function may limit her candidacy for contrast, unfortunately.  H. pylori gastritis and duodenal ulcers on EGD 02/09/2020: Successfully treated with 14 days of bismuth subsalicylate, metronidazole, tetracycline, pantoprazole. Felt better after completing treatment but follow-up testing not performed. Discussed EGD to document ulcer healing if CT scan is negative.   Pancytopenia including normocytic anemia - Documented on prior visit. Overall new since 11/2019. Etiology unclear. This was improved on the most recent labs available to me. However, she has had more recent labs since then. Will obtain to follow-up. Plan hematology consultation if her counts are not improving.   History of colon polyps: 3 tubular adenomas on colonoscopy 02/09/2020. Surveillance colonoscopy recommended 2024.  Pancolonic diverticulosis: High fiber diet recommended.   Family history of colon polyps (sister) and Family history of colon cancer (brother): Surveillance colonoscopy interval at least every 5 years.   Widely patent Schatzki's ring: No associated symptoms at this time.   PLAN: - Obtain labs from [facility] from over the last year - CMP today to evaluate renal function - CT abd/pelvis with oral and IV contrast if renal function is normal, otherwise will proceed with renal protocol CT - EGD to follow-up on duodenal ulcers  (she would like to have CT scan performed first) - Continue Megace as prescribed by her PCP - Surveillance colonoscopy 2024 - Follow-up in 3 months, earlier if needed    HPI: Virginia Watson is a 76 y.o. female who returns in follow-up with ongoing poor appetite and weight loss. She was last seen in our office 06/20/20 for the same symptoms.   She has a history of hypertension, tobacco abuse, COPD with ongoing cigarette use.  EGD 02/09/2020: Widely patent Schatzki's ring, H. pylori gastritis, multiple small duodenal bulb ulcers, peptic duodenitis confirmed by duodenal biopsy Colonoscopy 02/09/2020: 3 tubular adenomas, pancolonic diverticulosis  Completed 14 days of treatment with bismuth subsalicylate, metronidazole, tetracycline, and pantoprazole. Felt like fatigue and appetite improved after completing the treatment.  Follow-up H pylori stool antigen was negative.   No recent abdominal imaging.   Chest CT for lung cancer screening scheduled 01/10/22  Recent labs: 12/10/19: normal TSH, HIV, normal liver enzymes, WBC 6.5, hgb 12.7, platelets 218 03/21/2020 show a normal BMP except for creatinine of 1.20, white count 2.5, hemoglobin 10.7, MCV 87.5, RDW 14.0, platelets 128 06/20/20: hemoglobin 12.2, platelets 207  Her primary concerns are poor appetite and poor energy. She is concerned because she can't eat.  She has chronic constipation. She is also concerned about brown stools, as she feels like like they are normally a lighter colon of brown. Her PCP has given her Megace 10 mg daily for appetite but she is concerned this isn't explaining the cause. When her prescription ran out she lost weight waiting for the prescription to get filled. She has overall gained weight since her last visit here and she attributes this to Megace. GI ROS is otherwise negative.   She lost her brother, sister,  and nephew over a year. Her sister had similar symptoms prior to health with severe fatigue and poor  appetite. She denies depression or anxiety. She does not feel the appetite changes or weight changes are related to these deaths.   She denies the use of any NSAIDs.  Past Medical History:  Diagnosis Date   Allergy    Bone spur 04/2021   spine   Hypertension    Sciatica 04/2021   Scoliosis 04/2021    History reviewed. No pertinent surgical history.  Current Outpatient Medications  Medication Sig Dispense Refill   albuterol (VENTOLIN HFA) 108 (90 Base) MCG/ACT inhaler Inhale 2 puffs into the lungs every 6 (six) hours as needed for wheezing or shortness of breath. 18 g 0   amLODipine (NORVASC) 10 MG tablet TAKE 1 TABLET(10 MG) BY MOUTH DAILY 90 tablet 1   pantoprazole (PROTONIX) 40 MG tablet Take 1 tablet (40 mg total) by mouth daily. 90 tablet 3   atorvastatin (LIPITOR) 10 MG tablet Take 10 mg by mouth daily. (Patient not taking: Reported on 05/09/2021)     hydrochlorothiazide (MICROZIDE) 12.5 MG capsule TAKE 1 CAPSULE(12.5 MG) BY MOUTH AT BEDTIME (Patient not taking: Reported on 01/02/2022) 90 capsule 1   Iron-Vitamins (GERITOL COMPLETE PO) Take 1 tablet by mouth daily. (Patient not taking: Reported on 01/02/2022)     metoprolol tartrate (LOPRESSOR) 25 MG tablet TAKE 1 TABLET(25 MG) BY MOUTH TWICE DAILY (Patient not taking: Reported on 01/02/2022) 180 tablet 1   No current facility-administered medications for this visit.    Allergies as of 01/02/2022   (No Known Allergies)      Physical Exam: General:   Alert,  well-nourished, pleasant and cooperative in NAD Weight 152 pounds 01/27/20 Weight 147 pounds 06/20/20 Weight 157 pounds 01/02/22 Head:  Normocephalic and atraumatic. Eyes:  Sclera clear, no icterus.   Conjunctiva pink. Abdomen:  Soft, nontender, nondistended, normal bowel sounds, no rebound or guarding. No hepatosplenomegaly.   Msk:  Symmetrical. No boney deformities LAD: No inguinal or umbilical LAD Extremities:  No clubbing or edema. Neurologic:  Alert and  oriented x4;   grossly nonfocal Skin:  Intact without significant lesions or rashes. Psych:  Alert and cooperative. Normal mood and affect.   I spent 35 minutes, including in depth chart review, independent review of results, face-to-face time with the patient, coordinating care, and ordering studies and medications as appropriate, and documentation.   Ilai Hiller L. Tarri Glenn, MD, MPH 01/02/2022, 4:25 PM

## 2022-01-02 NOTE — Patient Instructions (Signed)
It was my pleasure to provide care to you today. Based on our discussion, I am providing you with my recommendations below:  RECOMMENDATION(S):   I recommend a CT scan and and EGD for further evaluation. We agreed to proceed with the CT scan first.   Please stop in the lab today to check your kidney function prior to the EGD.   CT SCAN:  You have been scheduled for a CT of the abdomen and pelvis at [facility] (1st floor of the hospital) on Monday 01/14/22 at 1:30 pm. Please arrive @ 1 pm for registration.   PREP:   Do not eat anything after 9:30 am (4 hours prior to your test)  Drink 1 bottle of contrast @ 11:30 am (2 hours prior to your exam)  Drink 1 bottle of contrast @ 12:30 pm (1 hour prior to your exam)   CONTRAST:  You will need to stop by [facility] to pick up 2 bottles of contrast 3 DAYS BEFORE YOUR EXAM. The solution may taste better if refrigerated. DO NOT add ice or dilute the contrast.  Shake well before drinking.   MEDICATIONS:  You may take your medications as prescribed with a small amount of water, if necessary.  The following medications MAY need to be held 48 hours before your exam: METFORMIN, GLUCOPHAGE, [place], AVANDAMET, RIOMET, FORTAMET, [place] MET, JANUMET, GLUMETZA or METAGLIP. Please contact [facility] at [phone] between the hours of 8:00 am and 5:00 pm, Monday-Friday to inquire further.   NEED TO RESCHEDULE?   Please call radiology at [phone].  WHY ARE YOU HAVING THIS EXAM?  The purpose of you drinking the oral contrast is to aid in the visualization of your intestinal tract. The contrast solution may cause some diarrhea. Depending on your individual set of symptoms, you may also receive an intravenous injection of x-ray contrast/dye. Plan on being at [facility] for 45 minutes or longer, depending on the type of exam you are having performed.    FOLLOW UP:  After your procedure, you will receive a  call from my office staff regarding my recommendation for follow up.  BMI:  If you are age 10 or older, your body mass index should be between 23-30. Your Body mass index is 25.44 kg/m. If this is out of the aforementioned range listed, please consider follow up with your Primary Care Provider.   MY CHART:  The [place] [facility] providers would like to encourage you to use [facility] to communicate with providers for non-urgent requests or questions.  Due to long hold times on the telephone, sending your provider a message by [facility] may be a faster and more efficient way to get a response.  Please allow 48 business hours for a response.  Please remember that this is for non-urgent requests.   Thank you for trusting me with your gastrointestinal care!    Thornton Park, MD, MPH

## 2022-01-04 ENCOUNTER — Other Ambulatory Visit: Payer: Self-pay

## 2022-01-04 DIAGNOSIS — R634 Abnormal weight loss: Secondary | ICD-10-CM

## 2022-01-09 ENCOUNTER — Telehealth: Payer: Self-pay | Admitting: Gastroenterology

## 2022-01-09 NOTE — Telephone Encounter (Signed)
Patient called states she would like to speak to a nurse regarding her test result. Please call to advise.

## 2022-01-09 NOTE — Telephone Encounter (Signed)
Refer to result note 9/6. Pt is aware.

## 2022-01-10 ENCOUNTER — Ambulatory Visit
Admission: RE | Admit: 2022-01-10 | Discharge: 2022-01-10 | Disposition: A | Discharge status: Home / Self Care | Payer: Medicare Other | Source: Ambulatory Visit | Attending: Student | Admitting: Student

## 2022-01-10 DIAGNOSIS — Z87891 Personal history of nicotine dependence: Secondary | ICD-10-CM

## 2022-01-14 ENCOUNTER — Ambulatory Visit (HOSPITAL_COMMUNITY)
Admission: RE | Admit: 2022-01-14 | Discharge: 2022-01-14 | Disposition: A | Discharge status: Home / Self Care | Payer: Medicare Other | Source: Ambulatory Visit | Attending: Gastroenterology | Admitting: Gastroenterology

## 2022-01-14 DIAGNOSIS — R634 Abnormal weight loss: Secondary | ICD-10-CM | POA: Diagnosis present

## 2022-01-14 MED ORDER — IOHEXOL 300 MG/ML  SOLN
100.0000 mL | Freq: Once | INTRAMUSCULAR | Status: AC | PRN
  Administered 2022-01-14: 100 mL via INTRAVENOUS

## 2022-01-14 MED ORDER — IOHEXOL 9 MG/ML PO SOLN: 1000.0000 mL | Freq: Once | ORAL | Status: DC

## 2022-02-11 ENCOUNTER — Telehealth: Payer: Self-pay | Admitting: Gastroenterology

## 2022-02-11 NOTE — Telephone Encounter (Signed)
Left message for patient to call back  

## 2022-02-11 NOTE — Telephone Encounter (Signed)
Requesting to speak with a nurse regarding CT results.

## 2022-02-12 ENCOUNTER — Other Ambulatory Visit: Payer: Self-pay

## 2022-02-12 DIAGNOSIS — R630 Anorexia: Secondary | ICD-10-CM

## 2022-02-12 DIAGNOSIS — B9681 Helicobacter pylori [H. pylori] as the cause of diseases classified elsewhere: Secondary | ICD-10-CM

## 2022-02-12 DIAGNOSIS — R634 Abnormal weight loss: Secondary | ICD-10-CM

## 2022-02-12 NOTE — Telephone Encounter (Signed)
Patient called in requesting results & recommendations for CT on 01/14/22.

## 2022-02-12 NOTE — Telephone Encounter (Signed)
Also documented as a result note.  There is some mild thickening in the small bowel of unclear clinical significance and diverticulosis.  I recommend an upper endoscopy for further evaluation.  This had been our plan at the time of her office visit as well.  Thank you.

## 2022-02-12 NOTE — Telephone Encounter (Signed)
Spoke with patient regarding MD recommendations. EGD scheduled for 02/26/22 at 10:00 am in the [facility]. Amb ref placed. No blood thinners or diabetic. Pt has been advised of instructions & sent a copy on mychart as well. Advised she call back with any further questions.

## 2022-02-25 ENCOUNTER — Telehealth: Payer: Self-pay | Admitting: Gastroenterology

## 2022-02-25 NOTE — Telephone Encounter (Signed)
Advised patient that a prep was not involved since she was only scheduled for EGD. Procedure moved back to tomorrow as previously scheduled. Pt verbalized all understanding.

## 2022-02-25 NOTE — Telephone Encounter (Signed)
Patient called to reschedule her procedure to 11/14. States she couldn't get her prep medication because she changed her pharmacy. States she would like her prep medication to be send to [facility] on [address] # [phone].

## 2022-02-26 ENCOUNTER — Other Ambulatory Visit: Payer: Self-pay

## 2022-02-26 ENCOUNTER — Telehealth: Payer: Self-pay

## 2022-02-26 ENCOUNTER — Encounter: Payer: Medicare Other | Admitting: Gastroenterology

## 2022-02-26 ENCOUNTER — Encounter: Payer: Self-pay | Admitting: Gastroenterology

## 2022-02-26 ENCOUNTER — Ambulatory Visit (AMBULATORY_SURGERY_CENTER): Payer: Medicare Other | Admitting: Gastroenterology

## 2022-02-26 VITALS — BP 106/77 | HR 65 | Temp 98.0°F | Resp 13 | Ht 66.0 in | Wt 157.0 lb

## 2022-02-26 DIAGNOSIS — R634 Abnormal weight loss: Secondary | ICD-10-CM

## 2022-02-26 DIAGNOSIS — K222 Esophageal obstruction: Secondary | ICD-10-CM

## 2022-02-26 DIAGNOSIS — F5000 Anorexia nervosa, unspecified: Secondary | ICD-10-CM

## 2022-02-26 DIAGNOSIS — K31819 Angiodysplasia of stomach and duodenum without bleeding: Secondary | ICD-10-CM | POA: Diagnosis not present

## 2022-02-26 DIAGNOSIS — K225 Diverticulum of esophagus, acquired: Secondary | ICD-10-CM | POA: Diagnosis not present

## 2022-02-26 DIAGNOSIS — R198 Other specified symptoms and signs involving the digestive system and abdomen: Secondary | ICD-10-CM

## 2022-02-26 MED ORDER — SODIUM CHLORIDE 0.9 % IV SOLN: 500.0000 mL | Freq: Once | INTRAVENOUS | Status: DC

## 2022-02-26 NOTE — Progress Notes (Signed)
Patient arrived in recovery coughing copious amounts of sputum.  Patient is bring it up on her own, but suctioned the patient.  Sats 87/up to 4l oxygen.  AFter 5 minutes the patient's sats are 96%. On 2l of O2.  Patient is much better.  1042  Patient is off O2 completely with o2 sats of 93.  Patient states that she is a long time smoker.

## 2022-02-26 NOTE — Progress Notes (Signed)
Referring Provider: Cipriano Mile, NP Primary Care Physician:  Cipriano Mile, NP   Indication for EGD:  History of duodenal ulcers, anorexia, weight loss   IMPRESSION:  History of duodenal ulcers, anorexia, weight loss Appropriate candidate for monitored anesthesia care  PLAN: EGD in the [facility] today   HPI: Virginia Watson is a 76 y.o. female presents for EGD.  EGD 02/09/2020: Widely patent Schatzki's ring, H. pylori gastritis, multiple small duodenal bulb ulcers, peptic duodenitis confirmed by duodenal biopsy Colonoscopy 02/09/2020: 3 tubular adenomas, pancolonic diverticulosis   Completed 14 days of treatment with bismuth subsalicylate, metronidazole, tetracycline, and pantoprazole. Felt like fatigue and appetite improved after completing the treatment.  Follow-up H pylori stool antigen was negative.    No recent abdominal imaging.   Chest CT for lung cancer screening scheduled 01/10/22   Recent labs: 12/10/19: normal TSH, HIV, normal liver enzymes, WBC 6.5, hgb 12.7, platelets 218 03/21/2020 show a normal BMP except for creatinine of 1.20, white count 2.5, hemoglobin 10.7, MCV 87.5, RDW 14.0, platelets 128 06/20/20: hemoglobin 12.2, platelets 207   Her primary concerns are ongoing poor appetite and poor energy. She is concerned because she can't eat.      Past Medical History:  Diagnosis Date   Allergy    Bone spur 04/2021   spine   COPD (chronic obstructive pulmonary disease) (HCC)    per pt told it was seen on an xray from an ER visit, not being treated   Heart murmur    Hypertension    Sciatica 04/2021   Scoliosis 04/2021    Past Surgical History:  Procedure Laterality Date   UPPER GASTROINTESTINAL ENDOSCOPY      Current Outpatient Medications  Medication Sig Dispense Refill   albuterol (VENTOLIN HFA) 108 (90 Base) MCG/ACT inhaler Inhale 2 puffs into the lungs every 6 (six) hours as needed for wheezing or shortness of breath. 18 g 0   amLODipine  (NORVASC) 2.5 MG tablet Take 2.5 mg by mouth daily.     hydrochlorothiazide (MICROZIDE) 12.5 MG capsule TAKE 1 CAPSULE(12.5 MG) BY MOUTH AT BEDTIME 90 capsule 1   HYDROcodone-acetaminophen (NORCO/VICODIN) 5-325 MG tablet Take 1 tablet by mouth every 6 (six) hours as needed.     Iron-Vitamins (GERITOL COMPLETE PO) Take 1 tablet by mouth daily.     megestrol (MEGACE) 40 MG/ML suspension Take 400 mg by mouth daily.     meloxicam (MOBIC) 7.5 MG tablet Take 7.5 mg by mouth daily.     metoprolol tartrate (LOPRESSOR) 25 MG tablet TAKE 1 TABLET(25 MG) BY MOUTH TWICE DAILY 180 tablet 1   pantoprazole (PROTONIX) 40 MG tablet Take 1 tablet (40 mg total) by mouth daily. 90 tablet 3   atorvastatin (LIPITOR) 10 MG tablet Take 10 mg by mouth daily. (Patient not taking: Reported on 05/09/2021)     Current Facility-Administered Medications  Medication Dose Route Frequency Provider Last Rate Last Admin   0.9 %  sodium chloride infusion  500 mL Intravenous Once Thornton Park, MD        Allergies as of 02/26/2022   (No Known Allergies)    Family History  Problem Relation Age of Onset   Alcohol abuse Mother    Asthma Mother    Hyperlipidemia Mother    Alcohol abuse Father    Heart attack Father    Depression Sister    Hypertension Sister    Colon polyps Sister    Drug abuse Brother    Alcohol abuse Brother  Colon cancer Brother    Esophageal cancer Neg Hx    Stomach cancer Neg Hx    Rectal cancer Neg Hx    Breast cancer Neg Hx      Physical Exam: General:   Alert,  well-nourished, pleasant and cooperative in NAD Head:  Normocephalic and atraumatic. Eyes:  Sclera clear, no icterus.   Conjunctiva pink. Mouth:  No deformity or lesions.   Neck:  Supple; no masses or thyromegaly. Lungs:  Clear throughout to auscultation.   No wheezes. Heart:  Regular rate and rhythm; no murmurs. Abdomen:  Soft, non-tender, nondistended, normal bowel sounds, no rebound or guarding.  Msk:  Symmetrical. No  boney deformities LAD: No inguinal or umbilical LAD Extremities:  No clubbing or edema. Neurologic:  Alert and  oriented x4;  grossly nonfocal Skin:  No obvious rash or bruise. Psych:  Alert and cooperative. Normal mood and affect.     Studies/Results: No results found.    Teodora Baumgarten L. Tarri Glenn, MD, MPH 02/26/2022, 9:52 AM

## 2022-02-26 NOTE — Op Note (Addendum)
[place] Patient Name: Virginia Watson Procedure Date: 02/26/2022 9:55 AM MRN: [id number] Endoscopist: Thornton Park MD, MD, [id number] Age: 76 Referring MD:  Date of Birth: 1945/09/11 Gender: Female Account #: [id number] Procedure:                Upper GI endoscopy Indications:              Follow-up of duodenal ulcer, ongoing anorexia and                            weight loss, history of H pylori Medicines:                Monitored Anesthesia Care Procedure:                Pre-Anesthesia Assessment:                           - Prior to the procedure, a History and Physical                            was performed, and patient medications and                            allergies were reviewed. The patient's tolerance of                            previous anesthesia was also reviewed. The risks                            and benefits of the procedure and the sedation                            options and risks were discussed with the patient.                            All questions were answered, and informed consent                            was obtained. Prior Anticoagulants: The patient has                            taken no anticoagulant or antiplatelet agents. ASA                            Grade Assessment: III - A patient with severe                            systemic disease. After reviewing the risks and                            benefits, the patient was deemed in satisfactory                            condition to undergo the procedure.  After obtaining informed consent, the endoscope was                            passed under direct vision. Throughout the                            procedure, the patient's blood pressure, pulse, and                            oxygen saturations were monitored continuously. The                            Endoscope was introduced through the mouth, and                            advanced  to the third part of duodenum. The upper                            GI endoscopy was accomplished without difficulty.                            The patient tolerated the procedure well. Scope In: Scope Out: Findings:                 A non-obstructing Schatzki ring was found at the                            gastroesophageal junction. The esophagus is                            otherwise normal.                           Diffuse mildly erythematous mucosa without bleeding                            was found in the gastric body. Biopsies were taken                            from the antrum, body, and fundus with a cold                            forceps for histology. Estimated blood loss was                            minimal.                           A large non-bleeding diverticulum was found in the                            second portion of the duodenum. The mouth of the                            diverticulum has a  polyp like appearance from afar                            (see photo). However, the appearance normalizes                            when in the body of the tic and there is no obvious                            polyp/adenoma. Biopsies of this area were taken                            with a cold forceps for histology. Estimated blood                            loss was minimal.                           The examined duodenum was otherwise normal except                            for a prominent fold in the bulb. No duodenal                            ulcers present. Biopsies were taken with a cold                            forceps for histology. Estimated blood loss was                            minimal. Complications:            No immediate complications. Estimated Blood Loss:     Estimated blood loss was minimal. Impression:               - Non-obstructing Schatzki ring.                           - Erythematous mucosa in the gastric body. Biopsied.                            - Non-bleeding duodenal diverticulum. Biopsied.                           - Normal examined duodenum. Biopsied. Recommendation:           - Patient has a contact number available for                            emergencies. The signs and symptoms of potential                            delayed complications were discussed with the                            patient. Return  to normal activities tomorrow.                            Written discharge instructions were provided to the                            patient.                           - Resume previous diet.                           - Continue present medications.                           - Await pathology results.                           - UGI series to better characterize her anatomy.                           - Office follow-up after the UGI. Thornton Park MD, MD 02/26/2022 10:25:25 AM This report has been signed electronically.

## 2022-02-26 NOTE — Telephone Encounter (Signed)
-----   Message from Thornton Park, MD sent at 02/26/2022 10:18 AM EDT ----- UGI series to evaluate weight loss, anorexia, and abnormal EGD  Then, needs office visit with me or an APP after the study to review.  Thanks.  KLB

## 2022-02-26 NOTE — Patient Instructions (Signed)
We will arrange an upper GI for you for further testing.  Resume all of your current medications.  Keep your appointment with your pulmonary doctor on Friday.  YOU HAD AN ENDOSCOPIC PROCEDURE TODAY AT [facility]:   Refer to the procedure report that was given to you for any specific questions about what was found during the examination.  If the procedure report does not answer your questions, please call your gastroenterologist to clarify.  If you requested that your care partner not be given the details of your procedure findings, then the procedure report has been included in a sealed envelope for you to review at your convenience later.  YOU SHOULD EXPECT: Some feelings of bloating in the abdomen. Passage of more gas than usual.  Walking can help get rid of the air that was put into your GI tract during the procedure and reduce the bloating.  Please Note:  You might notice some irritation and congestion in your nose or some drainage.  This is from the oxygen used during your procedure.  There is no need for concern and it should clear up in a day or so.  SYMPTOMS TO REPORT IMMEDIATELY:  Following upper endoscopy (EGD)  Vomiting of blood or coffee ground material  New chest pain or pain under the shoulder blades  Painful or persistently difficult swallowing  New shortness of breath  Fever of 100F or higher  Black, tarry-looking stools  For urgent or emergent issues, a gastroenterologist can be reached at any hour by calling [phone]. Do not use MyChart messaging for urgent concerns.    DIET:  We do recommend a small meal at first, but then you may proceed to your regular diet.  Drink plenty of fluids but you should avoid alcoholic beverages for 24 hours.  ACTIVITY:  You should plan to take it easy for the rest of today and you should NOT DRIVE or use heavy machinery until tomorrow (because of the sedation medicines used during the test).    FOLLOW UP: Our staff  will call the number listed on your records the next business day following your procedure.  We will call around 7:15- 8:00 am to check on you and address any questions or concerns that you may have regarding the information given to you following your procedure. If we do not reach you, we will leave a message.     If any biopsies were taken you will be contacted by phone or by letter within the next 1-3 weeks.  Please call us at [phone] if you have not heard about the biopsies in 3 weeks.    SIGNATURES/CONFIDENTIALITY: You and/or your care partner have signed paperwork which will be entered into your electronic medical record.  These signatures attest to the fact that that the information above on your After Visit Summary has been reviewed and is understood.  Full responsibility of the confidentiality of this discharge information lies with you and/or your care-partner.

## 2022-02-26 NOTE — Progress Notes (Signed)
Vss nad treans to pacu

## 2022-02-26 NOTE — Telephone Encounter (Signed)
OV scheduled for 04/08/22 at 11:00 am with Anderson Malta, [place]. UGI series ordered & schedulers notified. Mychart message sent to patient.

## 2022-02-26 NOTE — Progress Notes (Signed)
Called to room to assist during endoscopic procedure.  Patient ID and intended procedure confirmed with present staff. Received instructions for my participation in the procedure from the performing physician.  

## 2022-02-27 ENCOUNTER — Telehealth: Payer: Self-pay

## 2022-02-27 NOTE — Telephone Encounter (Signed)
  Follow up Call-     02/26/2022    9:11 AM 02/09/2020   10:09 AM  Call back number  Post procedure Call Back phone  # [phone]  Permission to leave phone message Yes Yes     Patient questions:  Do you have a fever, pain , or abdominal swelling? No. Pain Score  0 *  Have you tolerated food without any problems? Yes.    Have you been able to return to your normal activities? Yes.    Do you have any questions about your discharge instructions: Diet   No. Medications  No. Follow up visit  No.  Do you have questions or concerns about your Care? No.  Actions: * If pain score is 4 or above: No action needed, pain <4.

## 2022-03-01 ENCOUNTER — Encounter: Payer: Self-pay | Admitting: Nurse Practitioner

## 2022-03-01 ENCOUNTER — Ambulatory Visit (INDEPENDENT_AMBULATORY_CARE_PROVIDER_SITE_OTHER): Payer: Medicare Other | Admitting: Nurse Practitioner

## 2022-03-01 VITALS — BP 138/86 | HR 82 | Temp 98.3°F | Ht 65.0 in | Wt 159.0 lb

## 2022-03-01 DIAGNOSIS — G8929 Other chronic pain: Secondary | ICD-10-CM

## 2022-03-01 DIAGNOSIS — K5900 Constipation, unspecified: Secondary | ICD-10-CM

## 2022-03-01 DIAGNOSIS — R634 Abnormal weight loss: Secondary | ICD-10-CM

## 2022-03-01 DIAGNOSIS — M5442 Lumbago with sciatica, left side: Secondary | ICD-10-CM | POA: Diagnosis not present

## 2022-03-01 DIAGNOSIS — Z23 Encounter for immunization: Secondary | ICD-10-CM

## 2022-03-01 DIAGNOSIS — N1832 Chronic kidney disease, stage 3b: Secondary | ICD-10-CM | POA: Diagnosis not present

## 2022-03-01 DIAGNOSIS — M5441 Lumbago with sciatica, right side: Secondary | ICD-10-CM

## 2022-03-01 LAB — BASIC METABOLIC PANEL
BUN: 16 mg/dL (ref 6–23)
CO2: 24 mEq/L (ref 19–32)
Calcium: 10.7 mg/dL — ABNORMAL HIGH (ref 8.4–10.5)
Chloride: 106 mEq/L (ref 96–112)
Creatinine, Ser: 1.27 mg/dL — ABNORMAL HIGH (ref 0.40–1.20)
GFR: 41.83 mL/min — ABNORMAL LOW (ref >60.00)
Glucose, Bld: 86 mg/dL (ref 70–99)
Potassium: 3.7 mEq/L (ref 3.5–5.1)
Sodium: 139 mEq/L (ref 135–145)

## 2022-03-01 LAB — CBC
HCT: 37.8 % (ref 36.0–46.0)
Hemoglobin: 12.5 g/dL (ref 12.0–15.0)
MCHC: 33.1 g/dL (ref 30.0–36.0)
MCV: 92.1 fl (ref 78.0–100.0)
Platelets: 186 10*3/uL (ref 150.0–400.0)
RBC: 4.11 Mil/uL (ref 3.87–5.11)
RDW: 13.9 % (ref 11.5–15.5)
WBC: 6.4 10*3/uL (ref 4.0–10.5)

## 2022-03-01 LAB — VITAMIN D 25 HYDROXY (VIT D DEFICIENCY, FRACTURES): VITD: 56.19 ng/mL (ref 30.00–100.00)

## 2022-03-01 NOTE — Progress Notes (Signed)
New Patient Office Visit  Subjective    Patient ID: Virginia Watson, female    DOB: Mar 19, 1946  Age: 76 y.o. MRN: [id number]  CC:  Chief Complaint  Patient presents with   Establish Care    HPI Virginia Watson presents to establish care Unintentional weight loss: Undergoing evaluation for this via GI.  Awaiting pathology results from most recent endoscopy.  On Megace, reports appetite is still low but appears her weight has stabilized since taking this medication.  Constipation: Reports having bowel movement daily however feels that she does not empty completely.  Has taken stool softener in the past for this.  Chronic low back pain with bilateral sciatica: Currently takes hydrocodone chronically for this.  Hypercalcemia: Noted on recent blood work from a few months ago.  Level of 11.3.  Patient reported just recently starting vitamin D, not sure what dose she is on.  Stage IIIb chronic kidney disease noted as well, patient is not seeing nephrology currently.  Needs flu shot.  Outpatient Encounter Medications as of 03/01/2022  Medication Sig   albuterol (VENTOLIN HFA) 108 (90 Base) MCG/ACT inhaler Inhale 2 puffs into the lungs every 6 (six) hours as needed for wheezing or shortness of breath.   amLODipine (NORVASC) 2.5 MG tablet Take 2.5 mg by mouth daily.   docusate sodium (COLACE) 50 MG capsule Take 50 mg by mouth 2 (two) times daily as needed for mild constipation.   HYDROcodone-acetaminophen (NORCO/VICODIN) 5-325 MG tablet Take 1 tablet by mouth every 6 (six) hours as needed.   Iron-Vitamins (GERITOL COMPLETE PO) Take 1 tablet by mouth daily.   megestrol (MEGACE) 40 MG/ML suspension Take 400 mg by mouth daily.   metoprolol tartrate (LOPRESSOR) 25 MG tablet TAKE 1 TABLET(25 MG) BY MOUTH TWICE DAILY   pantoprazole (PROTONIX) 40 MG tablet Take 1 tablet (40 mg total) by mouth daily.   [DISCONTINUED] atorvastatin (LIPITOR) 10 MG tablet Take 10 mg by mouth daily.    [DISCONTINUED] hydrochlorothiazide (MICROZIDE) 12.5 MG capsule TAKE 1 CAPSULE(12.5 MG) BY MOUTH AT BEDTIME   [DISCONTINUED] meloxicam (MOBIC) 7.5 MG tablet Take 7.5 mg by mouth daily.   [DISCONTINUED] 0.9 %  sodium chloride infusion    No facility-administered encounter medications on file as of 03/01/2022.    Past Medical History:  Diagnosis Date   Allergy    Bone spur 04/2021   spine   COPD (chronic obstructive pulmonary disease) (HCC)    per pt told it was seen on an xray from an ER visit, not being treated   Heart murmur    Hypertension    Sciatica 04/2021   Scoliosis 04/2021    Past Surgical History:  Procedure Laterality Date   UPPER GASTROINTESTINAL ENDOSCOPY      Family History  Problem Relation Age of Onset   Alcohol abuse Mother    Asthma Mother    Hyperlipidemia Mother    Alcohol abuse Father    Heart attack Father    Depression Sister    Hypertension Sister    Colon polyps Sister    Drug abuse Brother    Alcohol abuse Brother    Colon cancer Brother    Esophageal cancer Neg Hx    Stomach cancer Neg Hx    Rectal cancer Neg Hx    Breast cancer Neg Hx     Social History   Socioeconomic History   Marital status: Single    Spouse name: Not on file   Number of children: Not  on file   Years of education: Not on file   Highest education level: Not on file  Occupational History   Not on file  Tobacco Use   Smoking status: Every Day    Packs/day: 1.50    Years: 50.00    Total pack years: 75.00    Types: Cigarettes   Smokeless tobacco: Never   Tobacco comments:    currently smoking 1ppd as of 05/22/20  Vaping Use   Vaping Use: Never used  Substance and Sexual Activity   Alcohol use: Yes    Comment: recovering etoh addict, occassionaly   Drug use: No   Sexual activity: Not on file  Other Topics Concern   Not on file  Social History Narrative   Not on file   Social Determinants of Health   Financial Resource Strain: Not on file  Food  Insecurity: Not on file  Transportation Needs: Not on file  Physical Activity: Not on file  Stress: Not on file  Social Connections: Not on file  Intimate Partner Violence: Not on file    Review of Systems  Constitutional:  Positive for weight loss. Negative for chills and fever.  Gastrointestinal:  Positive for constipation. Negative for abdominal pain, blood in stool and diarrhea.       (-) bowel incontinence, (+) anorexia since 2021-- drinking ensure  Genitourinary:  Negative for dysuria and hematuria.       (-) urinary incontinence  Neurological:  Positive for sensory change. Negative for dizziness and loss of consciousness.  Psychiatric/Behavioral:  Negative for suicidal ideas.         Objective    BP 138/86   Pulse 82   Temp 98.3 F (36.8 C) (Oral)   Ht '5\' 5"'$  (1.651 m)   Wt 159 lb (72.1 kg)   SpO2 94%   BMI 26.46 kg/m   Physical Exam Vitals reviewed.  Constitutional:      General: She is not in acute distress.    Appearance: Normal appearance.  HENT:     Head: Normocephalic and atraumatic.  Neck:     Vascular: No carotid bruit.  Cardiovascular:     Rate and Rhythm: Normal rate and regular rhythm.     Pulses: Normal pulses.     Heart sounds: Normal heart sounds.  Pulmonary:     Effort: Pulmonary effort is normal.     Breath sounds: Normal breath sounds.  Skin:    General: Skin is warm and dry.  Neurological:     General: No focal deficit present.     Mental Status: She is alert and oriented to person, place, and time.  Psychiatric:        Mood and Affect: Mood normal.        Behavior: Behavior normal.        Judgment: Judgment normal.         Assessment & Plan:   Patient reported having had blood work collected by previous PCP within the last couple months.  We will request previous records to review before determining if she is due for additional blood work  Problem List Items Addressed This Visit       Nervous and Auditory   Chronic  bilateral low back pain with bilateral sciatica - Primary    Chronic, referral to pain management made today.      Relevant Orders   Ambulatory referral to Pain Clinic     Genitourinary   Stage 3b chronic kidney disease (Progress)  Chronic, referral to nephrology made.      Relevant Orders   Ambulatory referral to Nephrology   CBC     Other   Hypercalcemia    Incidental finding, follow-up blood work including BMP, PTH, vitamin D level ordered today.  Further recommendations may be made based upon these results.      Relevant Orders   Basic metabolic panel   VITAMIN D 25 Hydroxy (Vit-D Deficiency, Fractures)   PTH, Intact and Calcium   Need for vaccination    Flu shot administered, VIS provided.      Relevant Orders   Flu Vaccine QUAD High Dose(Fluad) (Completed)   Unintentional weight loss    Continue to follow-up with Dr. Tarri Glenn for now regarding evaluation of this.      Constipation    Recommend use of Colace twice a day to help with bowel movements.       Return in about 1 month (around 03/31/2022) for f/u with Judson Roch.   Ailene Ards, NP

## 2022-03-01 NOTE — Assessment & Plan Note (Signed)
Chronic, referral to pain management made today.

## 2022-03-01 NOTE — Assessment & Plan Note (Signed)
Recommend use of Colace twice a day to help with bowel movements.

## 2022-03-01 NOTE — Assessment & Plan Note (Signed)
Flu shot administered, VIS provided. 

## 2022-03-01 NOTE — Assessment & Plan Note (Signed)
Chronic, referral to nephrology made.

## 2022-03-01 NOTE — Assessment & Plan Note (Signed)
Continue to follow-up with Dr. Tarri Glenn for now regarding evaluation of this.

## 2022-03-01 NOTE — Assessment & Plan Note (Signed)
Incidental finding, follow-up blood work including BMP, PTH, vitamin D level ordered today.  Further recommendations may be made based upon these results.

## 2022-03-04 LAB — PTH, INTACT AND CALCIUM
Calcium: 10.7 mg/dL — ABNORMAL HIGH (ref 8.6–10.4)
PTH: 34 pg/mL (ref 16–77)

## 2022-03-07 ENCOUNTER — Other Ambulatory Visit: Payer: Self-pay | Admitting: Nurse Practitioner

## 2022-03-08 NOTE — Telephone Encounter (Signed)
Patient called states she would like to speak with you. Requesting a call back. Please call to advise.

## 2022-03-11 ENCOUNTER — Other Ambulatory Visit: Payer: Self-pay

## 2022-03-11 NOTE — Telephone Encounter (Signed)
Patient called in to update pharmacy location. It has been updated.

## 2022-03-12 ENCOUNTER — Encounter: Payer: Medicare Other | Admitting: Gastroenterology

## 2022-03-13 ENCOUNTER — Encounter: Payer: Self-pay | Admitting: Physical Medicine & Rehabilitation

## 2022-03-19 ENCOUNTER — Ambulatory Visit (HOSPITAL_COMMUNITY)
Admission: RE | Admit: 2022-03-19 | Discharge: 2022-03-19 | Disposition: A | Discharge status: Home / Self Care | Payer: Medicare Other | Source: Ambulatory Visit | Attending: Gastroenterology | Admitting: Gastroenterology

## 2022-03-19 DIAGNOSIS — R634 Abnormal weight loss: Secondary | ICD-10-CM | POA: Insufficient documentation

## 2022-03-19 DIAGNOSIS — F5000 Anorexia nervosa, unspecified: Secondary | ICD-10-CM | POA: Diagnosis present

## 2022-03-19 DIAGNOSIS — R198 Other specified symptoms and signs involving the digestive system and abdomen: Secondary | ICD-10-CM | POA: Insufficient documentation

## 2022-03-28 ENCOUNTER — Ambulatory Visit (INDEPENDENT_AMBULATORY_CARE_PROVIDER_SITE_OTHER): Payer: Medicare Other | Admitting: *Deleted

## 2022-03-28 DIAGNOSIS — Z0000 Encounter for general adult medical examination without abnormal findings: Secondary | ICD-10-CM

## 2022-03-28 DIAGNOSIS — Z0100 Encounter for examination of eyes and vision without abnormal findings: Secondary | ICD-10-CM | POA: Diagnosis not present

## 2022-03-28 NOTE — Patient Instructions (Signed)

## 2022-03-28 NOTE — Progress Notes (Signed)
Subjective:   Virginia Watson is a 76 y.o. female who presents for an Initial Medicare Annual Wellness Visit. I connected with  Eugene Gavia on 03/28/22 by a audio enabled telemedicine application and verified that I am speaking with the correct person using two identifiers.  Patient Location: Home  Provider Location: Home Office  I discussed the limitations of evaluation and management by telemedicine. The patient expressed understanding and agreed to proceed.  Review of Systems    Deferred to PCP Cardiac Risk Factors include: advanced age (>14mn, >>13women);hypertension     Objective:    Today's Vitals   03/28/22 1330  PainSc: 6    There is no height or weight on file to calculate BMI.     03/28/2022    1:42 PM 03/20/2020   10:00 PM 03/19/2019   12:35 AM 10/13/2017   10:50 PM 01/10/2017   10:22 AM 06/05/2016   12:02 PM  Advanced Directives  Does Patient Have a Medical Advance Directive? No No No No No No  Would patient like information on creating a medical advance directive? No - Patient declined No - Patient declined No - Patient declined No - Patient declined  No - Patient declined    Current Medications (verified) Outpatient Encounter Medications as of 03/28/2022  Medication Sig   albuterol (VENTOLIN HFA) 108 (90 Base) MCG/ACT inhaler Inhale 2 puffs into the lungs every 6 (six) hours as needed for wheezing or shortness of breath.   amLODipine (NORVASC) 2.5 MG tablet Take 2.5 mg by mouth daily.   cholecalciferol (VITAMIN D3) 25 MCG (1000 UNIT) tablet Take 1,000 Units by mouth daily.   docusate sodium (COLACE) 50 MG capsule Take 50 mg by mouth 2 (two) times daily as needed for mild constipation.   Iron-Vitamins (GERITOL COMPLETE PO) Take 1 tablet by mouth daily.   megestrol (MEGACE) 40 MG/ML suspension Take 400 mg by mouth daily.   metoprolol tartrate (LOPRESSOR) 25 MG tablet TAKE 1 TABLET(25 MG) BY MOUTH TWICE DAILY   pantoprazole (PROTONIX) 40  MG tablet Take 1 tablet (40 mg total) by mouth daily.   HYDROcodone-acetaminophen (NORCO/VICODIN) 5-325 MG tablet Take 1 tablet by mouth every 6 (six) hours as needed. (Patient not taking: Reported on 03/28/2022)   No facility-administered encounter medications on file as of 03/28/2022.    Allergies (verified) Patient has no known allergies.   History: Past Medical History:  Diagnosis Date   Allergy    Bone spur 04/2021   spine   COPD (chronic obstructive pulmonary disease) (HCC)    per pt told it was seen on an xray from an ER visit, not being treated   Heart murmur    Hypertension    Sciatica 04/2021   Scoliosis 04/2021   Past Surgical History:  Procedure Laterality Date   UPPER GASTROINTESTINAL ENDOSCOPY     Family History  Problem Relation Age of Onset   Alcohol abuse Mother    Asthma Mother    Hyperlipidemia Mother    Alcohol abuse Father    Heart attack Father    Depression Sister    Hypertension Sister    Colon polyps Sister    Drug abuse Brother    Alcohol abuse Brother    Colon cancer Brother    Esophageal cancer Neg Hx    Stomach cancer Neg Hx    Rectal cancer Neg Hx    Breast cancer Neg Hx    Social History   Socioeconomic History   Marital  status: Single    Spouse name: Not on file   Number of children: 0   Years of education: 11th   Highest education level: Not on file  Occupational History   Not on file  Tobacco Use   Smoking status: Every Day    Packs/day: 0.25    Years: 50.00    Total pack years: 12.50    Types: Cigarettes   Smokeless tobacco: Never   Tobacco comments:    Currently smoking 5 cigarettes a day    1-800-QUIT-NOW (417) 165-7445).    http://bell-fernandez.com/                1-800-QUIT-NOW (973) 457-4335).    http://bell-fernandez.com/      Vaping Use   Vaping Use: Never used  Substance and Sexual Activity   Alcohol use: Yes    Comment: on holidays   Drug use: No   Sexual activity: Not Currently  Other  Topics Concern   Not on file  Social History Narrative   Not on file   Social Determinants of Health   Financial Resource Strain: Low Risk  (03/28/2022)   Overall Financial Resource Strain (CARDIA)    Difficulty of Paying Living Expenses: Not hard at all  Food Insecurity: No Food Insecurity (03/28/2022)   Hunger Vital Sign    Worried About Running Out of Food in the Last Year: Never true    Ran Out of Food in the Last Year: Never true  Transportation Needs: No Transportation Needs (03/28/2022)   PRAPARE - Hydrologist (Medical): No    Lack of Transportation (Non-Medical): No  Physical Activity: Inactive (03/28/2022)   Exercise Vital Sign    Days of Exercise per Week: 0 days    Minutes of Exercise per Session: 0 min  Stress: No Stress Concern Present (03/28/2022)   [place]    Feeling of Stress : Not at all  Social Connections: Socially Isolated (03/28/2022)   Social Connection and Isolation Panel [NHANES]    Frequency of Communication with Friends and Family: More than three times a week    Frequency of Social Gatherings with Friends and Family: More than three times a week    Attends Religious Services: Never    Marine scientist or Organizations: No    Attends Archivist Meetings: Never    Marital Status: Never married    Tobacco Counseling Ready to quit: No Counseling given: Yes Tobacco comments: Currently smoking 5 cigarettes a day 1-800-QUIT-NOW (508)251-9440). http://bell-fernandez.com/    1-800-QUIT-NOW 321 197 2329). http://bell-fernandez.com/    Clinical Intake:  Pre-visit preparation completed: Yes  Pain : 0-10 Pain Score: 6  Pain Type: Chronic pain Pain Location: Back Pain Orientation: Lower Pain Descriptors / Indicators: Aching, Dull, Discomfort, Constant Pain Relieving Factors: medication; reports she has an upcoming pain  management appointment  Pain Relieving Factors: medication; reports she has an upcoming pain management appointment  Nutritional Status: BMI 25 -29 Overweight Nutritional Risks: None Diabetes: No  How often do you need to have someone help you when you read instructions, pamphlets, or other written materials from your doctor or pharmacy?: 1 - Never What is the last grade level you completed in school?: 11th  Diabetic?No  Interpreter Needed?: No  Information entered by :: Emelia Loron RN   Activities of Daily Living    03/28/2022    1:40 PM  In your present state of health, do you have any difficulty performing  the following activities:  Hearing? 0  Vision? 0  Difficulty concentrating or making decisions? 0  Walking or climbing stairs? 0  Dressing or bathing? 0  Doing errands, shopping? 0  Preparing Food and eating ? N  Using the Toilet? N  In the past six months, have you accidently leaked urine? N  Do you have problems with loss of bowel control? N  Managing your Medications? N  Managing your Finances? N  Housekeeping or managing your Housekeeping? N    Patient Care Team: Ailene Ards, NP as PCP - General (Nurse Practitioner)  Indicate any recent Medical Services you may have received from other than [facility] providers in the past year (date may be approximate).     Assessment:   This is a routine wellness examination for Janie.  Hearing/Vision screen No results found.  Dietary issues and exercise activities discussed: Current Exercise Habits: The patient does not participate in regular exercise at present, Exercise limited by: orthopedic condition(s) (pain)   Goals Addressed             This Visit's Progress    Patient Stated       I want to quit smoking; I am down to smoking 5 cigarettes a day. I will call 1-800-quit-now.       Depression Screen    03/28/2022    1:39 PM 03/01/2022    1:18 PM 03/01/2022    1:17 PM  PHQ 2/9 Scores  PHQ - 2 Score 0 0 0   PHQ- 9 Score  12 12    Fall Risk    03/28/2022    1:56 PM 03/01/2022    1:18 PM  [place] in the past year? 0 0  Number falls in past yr: 0 0  Injury with Fall? 0 0  Risk for fall due to : History of fall(s);Impaired mobility No Fall Risks  Follow up Falls evaluation completed Falls evaluation completed    [place]:  Any stairs in or around the home? Yes  If so, are there any without handrails? Yes  Home free of loose throw rugs in walkways, pet beds, electrical cords, etc? Yes  Adequate lighting in your home to reduce risk of falls? Yes   ASSISTIVE DEVICES UTILIZED TO PREVENT FALLS:  Life alert? No  Use of a cane, walker or w/c? No  Grab bars in the bathroom? No  Shower chair or bench in shower? Yes  Elevated toilet seat or a handicapped toilet? No   Cognitive Function:        03/28/2022    1:44 PM  6CIT Screen  What Year? 0 points  What month? 0 points  What time? 0 points  Count back from 20 0 points  Months in reverse 0 points  Repeat phrase 0 points  Total Score 0 points    Immunizations Immunization History  Administered Date(s) Administered   Fluad Quad(high Dose 65+) 03/21/2020, 03/01/2022   Influenza Split 03/01/2015   PFIZER(Purple Top)SARS-COV-2 Vaccination 07/04/2019, 08/11/2019   Pneumococcal Polysaccharide-23 10/14/2017    TDAP status: Due, Education has been provided regarding the importance of this vaccine. Advised may receive this vaccine at local pharmacy or Health Dept. Aware to provide a copy of the vaccination record if obtained from local pharmacy or Health Dept. Verbalized acceptance and understanding.  Flu Vaccine status: Up to date  Pneumococcal vaccine status: Due, Education has been provided regarding the importance of this vaccine. Advised  may receive this vaccine at local pharmacy or Health Dept. Aware to provide a copy of the vaccination record if obtained from local pharmacy or  Health Dept. Verbalized acceptance and understanding.  Covid-19 vaccine status: Information provided on how to obtain vaccines.   Qualifies for Shingles Vaccine? Yes   Zostavax completed No   Shingrix Completed?: No.    Education has been provided regarding the importance of this vaccine. Patient has been advised to call insurance company to determine out of pocket expense if they have not yet received this vaccine. Advised may also receive vaccine at local pharmacy or Health Dept. Verbalized acceptance and understanding.  Screening Tests Health Maintenance  Topic Date Due   DTaP/Tdap/Td (1 - Tdap) Never done   Zoster Vaccines- Shingrix (1 of 2) Never done   DEXA SCAN  Never done   Pneumonia Vaccine 73+ Years old (2 - PCV) 10/15/2018   COVID-19 Vaccine (3 - 2023-24 season) 12/28/2021   Lung Cancer Screening  01/11/2023   COLONOSCOPY (Pts 45-49yr Insurance coverage will need to be confirmed)  02/09/2023   Medicare Annual Wellness (AWV)  03/29/2023   MAMMOGRAM  12/21/2023   INFLUENZA VACCINE  Completed   Hepatitis C Screening  Completed   HPV VACCINES  Aged Out    Health Maintenance  Health Maintenance Due  Topic Date Due   DTaP/Tdap/Td (1 - Tdap) Never done   Zoster Vaccines- Shingrix (1 of 2) Never done   DEXA SCAN  Never done   Pneumonia Vaccine 74 Years old (2 - PCV) 10/15/2018   COVID-19 Vaccine (3 - 2023-24 season) 12/28/2021    Colorectal cancer screening: Type of screening: Colonoscopy. Completed 02/09/20. Repeat every 3 years  Mammogram status: Completed 12/20/21. Repeat every year  Bone Density Status: Patient states that she recently had a bone density scan but cannot recall where she had the scan.  Lung Cancer Screening: (Low Dose CT Chest recommended if Age 74-80years, 30 pack-year currently smoking OR have quit w/in 15years.) does qualify.   Lung Cancer Screening Referral: 01/10/22  Additional Screening:  Hepatitis C Screening: does qualify; Completed  12/10/19  Vision Screening: Recommended annual ophthalmology exams for early detection of glaucoma and other disorders of the eye. Is the patient up to date with their annual eye exam?  No  Who is the provider or what is the name of the office in which the patient attends annual eye exams? Requested a referral to eye doctor If pt is not established with a provider, would they like to be referred to a provider to establish care? Yes .   Dental Screening: Recommended annual dental exams for proper oral hygiene  Community Resource Referral / Chronic Care Management: CRR required this visit?  No   CCM required this visit?  No      Plan:     I have personally reviewed and noted the following in the patient's chart:   Medical and social history Use of alcohol, tobacco or illicit drugs  Current medications and supplements including opioid prescriptions. Patient is currently taking opioid prescriptions. Information provided to patient regarding non-opioid alternatives. Patient advised to discuss non-opioid treatment plan with their provider. Functional ability and status Nutritional status Physical activity Advanced directives List of other physicians Hospitalizations, surgeries, and ER visits in previous 12 months Vitals Screenings to include cognitive, depression, and falls Referrals and appointments  In addition, I have reviewed and discussed with patient certain preventive protocols, quality metrics, and best practice recommendations. A written  personalized care plan for preventive services as well as general preventive health recommendations were provided to patient.     Michiel Cowboy, RN   03/28/2022   Nurse Notes:  Ms. Mullet , Thank you for taking time to come for your Medicare Wellness Visit. I appreciate your ongoing commitment to your health goals. Please review the following plan we discussed and let me know if I can assist you in the future.   These are the goals we  discussed:  Goals      Patient Stated     I want to quit smoking; I am down to smoking 5 cigarettes a day. I will call 1-800-quit-now.         This is a list of the screening recommended for you and due dates:  Health Maintenance  Topic Date Due   DTaP/Tdap/Td vaccine (1 - Tdap) Never done   Zoster (Shingles) Vaccine (1 of 2) Never done   DEXA scan (bone density measurement)  Never done   Pneumonia Vaccine (2 - PCV) 10/15/2018   COVID-19 Vaccine (3 - 2023-24 season) 12/28/2021   Screening for Lung Cancer  01/11/2023   Colon Cancer Screening  02/09/2023   Medicare Annual Wellness Visit  03/29/2023   Mammogram  12/21/2023   Flu Shot  Completed   Hepatitis C Screening: USPSTF Recommendation to screen - Ages 18-79 yo.  Completed   HPV Vaccine  Aged Out

## 2022-03-29 ENCOUNTER — Ambulatory Visit (INDEPENDENT_AMBULATORY_CARE_PROVIDER_SITE_OTHER): Payer: Medicare Other | Admitting: Nurse Practitioner

## 2022-03-29 VITALS — BP 118/80 | HR 74 | Temp 98.6°F | Ht 65.0 in | Wt 158.0 lb

## 2022-03-29 DIAGNOSIS — I10 Essential (primary) hypertension: Secondary | ICD-10-CM

## 2022-03-29 DIAGNOSIS — R634 Abnormal weight loss: Secondary | ICD-10-CM | POA: Diagnosis not present

## 2022-03-29 DIAGNOSIS — J449 Chronic obstructive pulmonary disease, unspecified: Secondary | ICD-10-CM | POA: Diagnosis not present

## 2022-03-29 DIAGNOSIS — N1832 Chronic kidney disease, stage 3b: Secondary | ICD-10-CM | POA: Diagnosis not present

## 2022-03-29 DIAGNOSIS — M8580 Other specified disorders of bone density and structure, unspecified site: Secondary | ICD-10-CM | POA: Insufficient documentation

## 2022-03-29 DIAGNOSIS — F17210 Nicotine dependence, cigarettes, uncomplicated: Secondary | ICD-10-CM

## 2022-03-29 MED ORDER — MEGESTROL ACETATE 40 MG/ML PO SUSP: 400.0000 mg | Freq: Every day | ORAL | 2 refills | Status: DC

## 2022-03-29 NOTE — Assessment & Plan Note (Signed)
Patient encouraged to continue to cut back on smoking.  Congratulated on her reduction in cigarette use from 1 pack/day down to 5 cigarettes/day.

## 2022-03-29 NOTE — Assessment & Plan Note (Signed)
Megace refilled today, patient encouraged to follow-up with GI as scheduled.

## 2022-03-29 NOTE — Patient Instructions (Addendum)
Please call [facility] for evaluation of high calcium in the setting of normal parathyroid hormone levels. [phone]  Please call [facility] specialist to schedule and appointment regarding treatment for your chronic kidney disease stage 3.  [phone]  Call office to be seen earlier than scheduled if you start to lose weight again.

## 2022-03-29 NOTE — Assessment & Plan Note (Addendum)
Chronic, patient may to consider RSV vaccination in the future.  She will continue using albuterol only as needed.  refer patient back to pulmonology for assistance with management.

## 2022-03-29 NOTE — Progress Notes (Signed)
Established Patient Office Visit  Subjective   Patient ID: Virginia Watson, female    DOB: 14-Jun-1945  Age: 76 y.o. MRN: [id number]  Chief Complaint  Patient presents with   Weight Loss   Unintentional weight loss: Has been ongoing concern and is being evaluated by GI.  Originally thought this could be related to H. pylori which she is undergoing treatment for.  She was also noted to have pancytopenia in the past but most recent CBC collected around 1 month ago showed no anemia.  She continues on Megace which has resulted in appetite stimulation and her weight has stabilized.  Also had CT scan of abdomen pelvis which did not show any acute abnormalities other than diverticulosis without diverticulitis and mild wall thickening in the proximal small bowel.  She is scheduled to follow back up with GI in approximately 10 days.  Hypertension: Chronic continues on amlodipine 2.5 mg by mouth daily as well as metoprolol 25 mg tablets.  She is tolerating medication well.  COPD/tobacco use: Up-to-date with flu, pneumonia, And COVID vaccinations.  Not oxygen dependent.  Uses albuterol only on as-needed basis.  Sees pulmonology, per chart review it appears she may be lost to follow-up as last office visit was in January 2022.  At that point she was told to follow-up in 3 months but do not see evidence that this ever did occur.  Hypercalcemia: Incidental finding, parathyroid hormone was also checked which was inappropriately normal.  Referral to endocrinology has been made.  She has not yet scheduled an appoint with them.  Chronic kidney disease stage IIIb: Chronic, last GFR 41.  Has been referred to nephrology, has not yet made an appoint with them.    Review of Systems  Respiratory:  Positive for cough (chronic, improved since reducing cigarette intake). Negative for shortness of breath.   Cardiovascular:  Negative for chest pain.      Objective:     BP 118/80 (BP Location: Left Arm,  Patient Position: Sitting, Cuff Size: Normal)   Pulse 74   Temp 98.6 F (37 C) (Oral)   Ht '5\' 5"'$  (1.651 m)   Wt 158 lb (71.7 kg)   SpO2 95%   BMI 26.29 kg/m  BP Readings from Last 3 Encounters:  03/29/22 118/80  03/01/22 138/86  02/26/22 106/77   Wt Readings from Last 3 Encounters:  03/29/22 158 lb (71.7 kg)  03/01/22 159 lb (72.1 kg)  02/26/22 157 lb (71.2 kg)      Physical Exam Vitals reviewed.  Constitutional:      General: She is not in acute distress.    Appearance: Normal appearance.  HENT:     Head: Normocephalic and atraumatic.  Neck:     Vascular: No carotid bruit.  Cardiovascular:     Rate and Rhythm: Normal rate and regular rhythm.     Pulses: Normal pulses.     Heart sounds: Normal heart sounds.  Pulmonary:     Effort: Pulmonary effort is normal.     Breath sounds: Normal breath sounds.  Skin:    General: Skin is warm and dry.  Neurological:     General: No focal deficit present.     Mental Status: She is alert and oriented to person, place, and time.  Psychiatric:        Mood and Affect: Mood normal.        Behavior: Behavior normal.        Judgment: Judgment normal.  No results found for any visits on 03/29/22.    The ASCVD Risk score (Arnett DK, et al., 2019) failed to calculate for the following reasons:   Cannot find a previous HDL lab   Cannot find a previous total cholesterol lab    Assessment & Plan:   Problem List Items Addressed This Visit       Cardiovascular and Mediastinum   Hypertension    Chronic, well-controlled on current regimen.  Continue amlodipine 2.5 mg daily and metoprolol 25 mg twice a day.        Respiratory   COPD (chronic obstructive pulmonary disease) (HCC)    Chronic, patient may to consider RSV vaccination in the future.  She will continue using albuterol only as needed.  refer patient back to pulmonology for assistance with management.      Relevant Orders   Ambulatory referral to Pulmonology      Genitourinary   Stage 3b chronic kidney disease ([place])    Chronic, patient provided with [place] [facility] office phone number to schedule an appointment.        Other   Cigarette nicotine dependence without complication    Patient encouraged to continue to cut back on smoking.  Congratulated on her reduction in cigarette use from 1 pack/day down to 5 cigarettes/day.      Hypercalcemia    Patient encouraged to follow-up with endocrinology, she was provided with the office phone number to schedule an appointment.      Unintentional weight loss - Primary    Megace refilled today, patient encouraged to follow-up with GI as scheduled.      Relevant Medications   megestrol (MEGACE) 40 MG/ML suspension    Return in about 3 months (around 06/28/2022) for F/u with Judson Roch.    Ailene Ards, NP

## 2022-03-29 NOTE — Assessment & Plan Note (Signed)
Chronic, well-controlled on current regimen.  Continue amlodipine 2.5 mg daily and metoprolol 25 mg twice a day.

## 2022-03-29 NOTE — Assessment & Plan Note (Signed)
Patient encouraged to follow-up with endocrinology, she was provided with the office phone number to schedule an appointment.

## 2022-03-29 NOTE — Assessment & Plan Note (Signed)
Chronic, patient provided with [place] [facility] phone number to schedule an appointment.

## 2022-04-08 ENCOUNTER — Encounter: Payer: Self-pay | Admitting: Physician Assistant

## 2022-04-08 ENCOUNTER — Ambulatory Visit (INDEPENDENT_AMBULATORY_CARE_PROVIDER_SITE_OTHER): Payer: Medicare Other | Admitting: Physician Assistant

## 2022-04-08 VITALS — BP 130/80 | HR 76 | Ht 65.0 in | Wt 159.0 lb

## 2022-04-08 DIAGNOSIS — R1084 Generalized abdominal pain: Secondary | ICD-10-CM

## 2022-04-08 DIAGNOSIS — K5710 Diverticulosis of small intestine without perforation or abscess without bleeding: Secondary | ICD-10-CM

## 2022-04-08 DIAGNOSIS — R634 Abnormal weight loss: Secondary | ICD-10-CM | POA: Diagnosis not present

## 2022-04-08 MED ORDER — DICYCLOMINE HCL 20 MG PO TABS: 20.0000 mg | ORAL_TABLET | Freq: Four times a day (QID) | ORAL | 3 refills | Status: DC | PRN

## 2022-04-08 NOTE — Patient Instructions (Signed)
We have sent the following medications to your pharmacy for you to pick up at your convenience: Dicyclomine   Follow up CT-Scan in March 2024. You will be contacted by Dr. Tarri Glenn nurse to schedule this later in the year.    Due to recent changes in healthcare laws, you may see the results of your imaging and laboratory studies on MyChart before your provider has had a chance to review them.  We understand that in some cases there may be results that are confusing or concerning to you. Not all laboratory results come back in the same time frame and the provider may be waiting for multiple results in order to interpret others.  Please give [place] 48 hours in order for your provider to thoroughly review all the results before contacting the office for clarification of your results.   Thank you for choosing me and [facility].  Ellouise Newer PA-C

## 2022-04-08 NOTE — Progress Notes (Signed)
Chief Complaint: Follow up weight loss  HPI:    Virginia Watson is a 76 year old African-American female, known to Dr. Tarri Glenn, with a past medical history as listed below including COPD, sciatica and scoliosis, who returns to clinic today for follow-up of her weight loss.      02/09/2020 EGD with H. pylori gastritis and duodenal ulcer successfully treated with 14 days of quadruple therapy.  Colonoscopy with 3 tubular adenomas and surveillance recommended in 2024.     01/02/2022 patient seen in clinic by Dr. Tarri Glenn for ongoing anorexia with prior weight loss.  At that time discussed that initially was thought this related to H. pylori gastritis and ulcers however other etiologies had to be excluded particular given cigarette history.  Her weight loss has stabilized with Megace.  She was concerned about ongoing anorexia.  At that time ordered a CT and repeat EGD.    01/14/2022 CTAP with contrast showed diverticulosis without diverticulitis and mild wall thickening in the proximal small bowel of uncertain significance which may represent a mild enteritis.    02/26/2022 EGD with nonobstructing Schatzki's ring, erythematous mucosa in the gastric body, nonbleeding duodenal diverticulum and normal duodenum.  Biopsies were normal.    03/19/2022 upper GI study for weight loss and anorexia with coarsened appearance of the gastric folds suggesting gastritis and diverticulum of the second portion of the duodenum, nonspecific wall thickening of the proximal duodenum better appreciated, tiny sliding hiatal hernia and mild esophageal dysmotility.  That time Dr. Tarri Glenn recommended repeat CT scan in March or 2024 to follow-up on the duodenal diverticulum abnormality.    Today, the patient tells me that she is doing well, her weight is holding steady when she weighs herself at home and she continues on Megace.  Her only complaint today is that she continues with some occasional abdominal cramping, sometimes so severe that  she has to pull over the car and lay down in the backseat.  This only happens once every few months, but she actually has a cramp at the time of my exam.  Tells me this typically will go away within 4 to 6 hours, but sometimes can last a longer, does not seem to be brought on anything in specific.  No change in bowel habits, nausea or vomiting.    Denies fever or chills.  Past Medical History:  Diagnosis Date   Allergy    Bone spur 04/2021   spine   COPD (chronic obstructive pulmonary disease) (HCC)    per pt told it was seen on an xray from an ER visit, not being treated   Heart murmur    Hypertension    Sciatica 04/2021   Scoliosis 04/2021    Past Surgical History:  Procedure Laterality Date   UPPER GASTROINTESTINAL ENDOSCOPY      Current Outpatient Medications  Medication Sig Dispense Refill   albuterol (VENTOLIN HFA) 108 (90 Base) MCG/ACT inhaler Inhale 2 puffs into the lungs every 6 (six) hours as needed for wheezing or shortness of breath. 18 g 0   amLODipine (NORVASC) 2.5 MG tablet Take 2.5 mg by mouth daily.     cholecalciferol (VITAMIN D3) 25 MCG (1000 UNIT) tablet Take 1,000 Units by mouth daily.     docusate sodium (COLACE) 50 MG capsule Take 50 mg by mouth 2 (two) times daily as needed for mild constipation.     Iron-Vitamins (GERITOL COMPLETE PO) Take 1 tablet by mouth daily.     megestrol (MEGACE) 40 MG/ML suspension Take  10 mLs (400 mg total) by mouth daily. 240 mL 2   metoprolol tartrate (LOPRESSOR) 25 MG tablet TAKE 1 TABLET(25 MG) BY MOUTH TWICE DAILY 180 tablet 1   pantoprazole (PROTONIX) 40 MG tablet Take 1 tablet (40 mg total) by mouth daily. 90 tablet 3   HYDROcodone-acetaminophen (NORCO/VICODIN) 5-325 MG tablet Take 1 tablet by mouth every 6 (six) hours as needed. (Patient not taking: Reported on 04/08/2022)     No current facility-administered medications for this visit.    Allergies as of 04/08/2022   (No Known Allergies)    Family History  Problem  Relation Age of Onset   Alcohol abuse Mother    Asthma Mother    Hyperlipidemia Mother    Alcohol abuse Father    Heart attack Father    Depression Sister    Hypertension Sister    Colon polyps Sister    Drug abuse Brother    Alcohol abuse Brother    Colon cancer Brother    Esophageal cancer Neg Hx    Stomach cancer Neg Hx    Rectal cancer Neg Hx    Breast cancer Neg Hx     Social History   Socioeconomic History   Marital status: Single    Spouse name: Not on file   Number of children: 0   Years of education: 11th   Highest education level: Not on file  Occupational History   Not on file  Tobacco Use   Smoking status: Every Day    Packs/day: 0.25    Years: 50.00    Total pack years: 12.50    Types: Cigarettes   Smokeless tobacco: Never   Tobacco comments:    Currently smoking 5 cigarettes a day    1-800-QUIT-NOW 684-438-8944).    http://bell-fernandez.com/                1-800-QUIT-NOW (773) 131-0493).    http://bell-fernandez.com/      Vaping Use   Vaping Use: Never used  Substance and Sexual Activity   Alcohol use: Yes    Comment: on holidays   Drug use: No   Sexual activity: Not Currently  Other Topics Concern   Not on file  Social History Narrative   Not on file   Social Determinants of Health   Financial Resource Strain: Low Risk  (03/28/2022)   Overall Financial Resource Strain (CARDIA)    Difficulty of Paying Living Expenses: Not hard at all  Food Insecurity: No Food Insecurity (03/28/2022)   Hunger Vital Sign    Worried About Running Out of Food in the Last Year: Never true    Ran Out of Food in the Last Year: Never true  Transportation Needs: No Transportation Needs (03/28/2022)   PRAPARE - Hydrologist (Medical): No    Lack of Transportation (Non-Medical): No  Physical Activity: Inactive (03/28/2022)   Exercise Vital Sign    Days of Exercise per Week: 0 days    Minutes of Exercise per Session: 0 min   Stress: No Stress Concern Present (03/28/2022)   [place]    Feeling of Stress : Not at all  Social Connections: Socially Isolated (03/28/2022)   Social Connection and Isolation Panel [NHANES]    Frequency of Communication with Friends and Family: More than three times a week    Frequency of Social Gatherings with Friends and Family: More than three times a week    Attends Religious Services: Never  Active Member of Clubs or Organizations: No    Attends Archivist Meetings: Never    Marital Status: Never married  Intimate Partner Violence: Not At Risk (03/28/2022)   Humiliation, Afraid, Rape, and Kick questionnaire    Fear of Current or Ex-Partner: No    Emotionally Abused: No    Physically Abused: No    Sexually Abused: No    Review of Systems:    Constitutional: No weight loss, fever or chills Cardiovascular: No chest pain Respiratory: No SOB  Gastrointestinal: See HPI and otherwise negative   Physical Exam:  Vital signs: BP 130/80   Pulse 76   Ht '5\' 5"'$  (1.651 m)   Wt 159 lb (72.1 kg)   BMI 26.46 kg/m    Constitutional:   Pleasant AA female appears to be in NAD, Well developed, Well nourished, alert and cooperative Respiratory: Respirations even and unlabored. Lungs clear to auscultation bilaterally.   No wheezes, crackles, or rhonchi.  Cardiovascular: Normal S1, S2. No MRG. Regular rate and rhythm. No peripheral edema, cyanosis or pallor.  Gastrointestinal:  Soft, nondistended, nontender. No rebound or guarding. Normal bowel sounds. +hardened area to the right of the navel where she describes a "cramp" Rectal:  Not performed.  Psychiatric: Oriented to person, place and time. Demonstrates good judgement and reason without abnormal affect or behaviors.  RELEVANT LABS AND IMAGING: CBC    Component Value Date/Time   WBC 6.4 03/01/2022 1353   RBC 4.11 03/01/2022 1353   HGB 12.5 03/01/2022  1353   HCT 37.8 03/01/2022 1353   PLT 186.0 03/01/2022 1353   MCV 92.1 03/01/2022 1353   MCH 29.8 03/21/2020 0247   MCHC 33.1 03/01/2022 1353   RDW 13.9 03/01/2022 1353   LYMPHSABS 1,931 12/10/2019 1450   EOSABS 143 12/10/2019 1450   BASOSABS 52 12/10/2019 1450    CMP     Component Value Date/Time   NA 139 03/01/2022 1353   K 3.7 03/01/2022 1353   CL 106 03/01/2022 1353   CO2 24 03/01/2022 1353   GLUCOSE 86 03/01/2022 1353   BUN 16 03/01/2022 1353   CREATININE 1.27 (H) 03/01/2022 1353   CREATININE 1.45 (H) 12/10/2019 1450   CALCIUM 10.7 (H) 03/01/2022 1353   CALCIUM 10.7 (H) 03/01/2022 1353   PROT 7.8 01/02/2022 1554   ALBUMIN 4.1 01/02/2022 1554   AST 14 01/02/2022 1554   ALT 10 01/02/2022 1554   ALKPHOS 63 01/02/2022 1554   BILITOT 0.6 01/02/2022 1554   GFRNONAA 48 (L) 03/21/2020 0247   GFRNONAA 36 (L) 12/10/2019 1450   GFRAA 42 (L) 12/10/2019 1450    Assessment: 1.  Weight loss: No further weight loss over the past 3 to 4 months with Megace, recent EGD as in HPI with duodenal diverticulum, recommend repeat imaging in March 2.  Duodenal diverticulum: With above 3.  Abdominal cramping: Describes occasional abdominal cramping, also at time of exam with hardened area appreciated to the right of the navel, if this continues or worsens would recommend further imaging possible small bowel enterography given question of enteritis on last CT consider occasional intussusception?  Versus cramping versus other  Plan: 1.  Patient was in recall for repeat CT in March as per recommendations from Dr. Tarri Glenn. 2.  Discussed hardened area I felt on exam, this coincides with cramp sensation that she has every once in a while.  Prescribed Dicyclomine 20 mg Q 4-6 hours as needed for pain #15 with 3 refills.  Discussed that I  do not want her to use this constantly but she can use it as needed. 3.  If patient has increased cramping sensations or if CT questions enteritis again would recommend  follow-up small bowel enterography. 4.  Patient to continue Megace and continue to monitor her weight. 5.  Patient to follow-up per recommendations after CT is scheduled in March or before that if symptoms increase/worsen.  Ellouise Newer, PA-C [facility] 04/08/2022, 10:49 AM  Cc: Cipriano Mile, NP

## 2022-04-23 ENCOUNTER — Observation Stay (HOSPITAL_BASED_OUTPATIENT_CLINIC_OR_DEPARTMENT_OTHER)
Admission: EM | Admit: 2022-04-23 | Discharge: 2022-04-25 | Disposition: A | Discharge status: Home / Self Care | Payer: Medicare Other | Attending: Internal Medicine | Admitting: Internal Medicine

## 2022-04-23 ENCOUNTER — Other Ambulatory Visit: Payer: Self-pay

## 2022-04-23 ENCOUNTER — Encounter (HOSPITAL_BASED_OUTPATIENT_CLINIC_OR_DEPARTMENT_OTHER): Payer: Self-pay

## 2022-04-23 ENCOUNTER — Emergency Department (HOSPITAL_BASED_OUTPATIENT_CLINIC_OR_DEPARTMENT_OTHER): Payer: Medicare Other | Admitting: Radiology

## 2022-04-23 DIAGNOSIS — F17210 Nicotine dependence, cigarettes, uncomplicated: Secondary | ICD-10-CM | POA: Diagnosis not present

## 2022-04-23 DIAGNOSIS — Z79899 Other long term (current) drug therapy: Secondary | ICD-10-CM | POA: Diagnosis not present

## 2022-04-23 DIAGNOSIS — R0600 Dyspnea, unspecified: Secondary | ICD-10-CM | POA: Diagnosis present

## 2022-04-23 DIAGNOSIS — J432 Centrilobular emphysema: Secondary | ICD-10-CM

## 2022-04-23 DIAGNOSIS — J441 Chronic obstructive pulmonary disease with (acute) exacerbation: Secondary | ICD-10-CM | POA: Diagnosis not present

## 2022-04-23 DIAGNOSIS — I129 Hypertensive chronic kidney disease with stage 1 through stage 4 chronic kidney disease, or unspecified chronic kidney disease: Secondary | ICD-10-CM | POA: Insufficient documentation

## 2022-04-23 DIAGNOSIS — J9601 Acute respiratory failure with hypoxia: Secondary | ICD-10-CM | POA: Insufficient documentation

## 2022-04-23 DIAGNOSIS — N1832 Chronic kidney disease, stage 3b: Secondary | ICD-10-CM | POA: Diagnosis not present

## 2022-04-23 DIAGNOSIS — Z1152 Encounter for screening for COVID-19: Secondary | ICD-10-CM | POA: Diagnosis not present

## 2022-04-23 LAB — BASIC METABOLIC PANEL
Anion gap: 11 (ref 5–15)
BUN: 17 mg/dL (ref 8–23)
CO2: 22 mmol/L (ref 22–32)
Calcium: 10.9 mg/dL — ABNORMAL HIGH (ref 8.9–10.3)
Chloride: 107 mmol/L (ref 98–111)
Creatinine, Ser: 1.24 mg/dL — ABNORMAL HIGH (ref 0.44–1.00)
GFR, Estimated: 46 mL/min — ABNORMAL LOW (ref >60)
Glucose, Bld: 87 mg/dL (ref 70–99)
Potassium: 4 mmol/L (ref 3.5–5.1)
Sodium: 140 mmol/L (ref 135–145)

## 2022-04-23 LAB — CBC
HCT: 36 % (ref 36.0–46.0)
Hemoglobin: 12.4 g/dL (ref 12.0–15.0)
MCH: 30.8 pg (ref 26.0–34.0)
MCHC: 34.4 g/dL (ref 30.0–36.0)
MCV: 89.3 fL (ref 80.0–100.0)
Platelets: 179 10*3/uL (ref 150–400)
RBC: 4.03 MIL/uL (ref 3.87–5.11)
RDW: 13.4 % (ref 11.5–15.5)
WBC: 5 10*3/uL (ref 4.0–10.5)
nRBC: 0 % (ref 0.0–0.2)

## 2022-04-23 LAB — RESP PANEL BY RT-PCR (RSV, FLU A&B, COVID)  RVPGX2
Influenza A by PCR: NEGATIVE
Influenza B by PCR: NEGATIVE
Resp Syncytial Virus by PCR: NEGATIVE
SARS Coronavirus 2 by RT PCR: NEGATIVE

## 2022-04-23 LAB — TROPONIN I (HIGH SENSITIVITY): Troponin I (High Sensitivity): 4 ng/L (ref <18)

## 2022-04-23 LAB — BRAIN NATRIURETIC PEPTIDE: B Natriuretic Peptide: 13.4 pg/mL (ref 0.0–100.0)

## 2022-04-23 NOTE — ED Triage Notes (Signed)
Patient here POV from Home.  Endorses Productive Cough, SOB that began 2 Days ago. Mostly Constant but Wavers in Intensity.   No Fevers or Pain.   NAD Noted during Triage. A&Ox4. GCS 15. Ambulatory.

## 2022-04-24 DIAGNOSIS — J441 Chronic obstructive pulmonary disease with (acute) exacerbation: Secondary | ICD-10-CM | POA: Diagnosis not present

## 2022-04-24 LAB — HIV ANTIBODY (ROUTINE TESTING W REFLEX): HIV Screen 4th Generation wRfx: NONREACTIVE

## 2022-04-24 MED ORDER — ONDANSETRON HCL 4 MG PO TABS: 4.0000 mg | ORAL_TABLET | Freq: Four times a day (QID) | ORAL | Status: DC | PRN

## 2022-04-24 MED ORDER — ALBUTEROL SULFATE (2.5 MG/3ML) 0.083% IN NEBU: 2.5000 mg | INHALATION_SOLUTION | RESPIRATORY_TRACT | Status: DC | PRN

## 2022-04-24 MED ORDER — METOPROLOL TARTRATE 25 MG PO TABS
25.0000 mg | ORAL_TABLET | Freq: Two times a day (BID) | ORAL | Status: DC
  Administered 2022-04-24 – 2022-04-25 (×3): 25 mg via ORAL
  Filled 2022-04-24 (×3): qty 1

## 2022-04-24 MED ORDER — ACETAMINOPHEN 325 MG PO TABS: 650.0000 mg | ORAL_TABLET | Freq: Four times a day (QID) | ORAL | Status: DC | PRN

## 2022-04-24 MED ORDER — SENNA 8.6 MG PO TABS
1.0000 | ORAL_TABLET | Freq: Two times a day (BID) | ORAL | Status: DC
  Administered 2022-04-24 – 2022-04-25 (×2): 8.6 mg via ORAL
  Filled 2022-04-24 (×2): qty 1

## 2022-04-24 MED ORDER — PREDNISONE 50 MG PO TABS
60.0000 mg | ORAL_TABLET | Freq: Once | ORAL | Status: AC
  Administered 2022-04-24: 60 mg via ORAL
  Filled 2022-04-24: qty 1

## 2022-04-24 MED ORDER — UMECLIDINIUM BROMIDE 62.5 MCG/ACT IN AEPB
1.0000 | INHALATION_SPRAY | Freq: Every day | RESPIRATORY_TRACT | Status: DC
  Administered 2022-04-25: 1 via RESPIRATORY_TRACT
  Filled 2022-04-24: qty 7

## 2022-04-24 MED ORDER — MOMETASONE FURO-FORMOTEROL FUM 200-5 MCG/ACT IN AERO
2.0000 | INHALATION_SPRAY | Freq: Two times a day (BID) | RESPIRATORY_TRACT | Status: DC
  Administered 2022-04-24 – 2022-04-25 (×2): 2 via RESPIRATORY_TRACT
  Filled 2022-04-24: qty 8.8

## 2022-04-24 MED ORDER — SODIUM CHLORIDE 0.9 % IV SOLN
1.0000 g | INTRAVENOUS | Status: DC
  Administered 2022-04-24 – 2022-04-25 (×2): 1 g via INTRAVENOUS
  Filled 2022-04-24 (×2): qty 10

## 2022-04-24 MED ORDER — OXYCODONE HCL 5 MG PO TABS
5.0000 mg | ORAL_TABLET | ORAL | Status: DC | PRN
  Administered 2022-04-24: 5 mg via ORAL
  Filled 2022-04-24: qty 1

## 2022-04-24 MED ORDER — ALBUTEROL (5 MG/ML) CONTINUOUS INHALATION SOLN: 10.0000 mg/h | INHALATION_SOLUTION | Freq: Once | RESPIRATORY_TRACT | Status: DC

## 2022-04-24 MED ORDER — ONDANSETRON HCL 4 MG/2ML IJ SOLN: 4.0000 mg | Freq: Four times a day (QID) | INTRAMUSCULAR | Status: DC | PRN

## 2022-04-24 MED ORDER — ENOXAPARIN SODIUM 40 MG/0.4ML IJ SOSY
40.0000 mg | PREFILLED_SYRINGE | INTRAMUSCULAR | Status: DC
  Filled 2022-04-24: qty 0.4

## 2022-04-24 MED ORDER — ALBUTEROL SULFATE (2.5 MG/3ML) 0.083% IN NEBU
10.0000 mg | INHALATION_SOLUTION | Freq: Once | RESPIRATORY_TRACT | Status: AC
  Administered 2022-04-24: 10 mg via RESPIRATORY_TRACT
  Filled 2022-04-24: qty 12

## 2022-04-24 MED ORDER — HYDROMORPHONE HCL 1 MG/ML IJ SOLN: 0.5000 mg | INTRAMUSCULAR | Status: DC | PRN

## 2022-04-24 MED ORDER — AMLODIPINE BESYLATE 5 MG PO TABS
2.5000 mg | ORAL_TABLET | Freq: Every day | ORAL | Status: DC
  Administered 2022-04-24 – 2022-04-25 (×2): 2.5 mg via ORAL
  Filled 2022-04-24 (×2): qty 1

## 2022-04-24 MED ORDER — ALBUTEROL SULFATE (2.5 MG/3ML) 0.083% IN NEBU
2.5000 mg | INHALATION_SOLUTION | Freq: Four times a day (QID) | RESPIRATORY_TRACT | Status: DC
  Administered 2022-04-24 – 2022-04-25 (×2): 2.5 mg via RESPIRATORY_TRACT
  Filled 2022-04-24 (×2): qty 3

## 2022-04-24 MED ORDER — PREDNISONE 20 MG PO TABS
40.0000 mg | ORAL_TABLET | Freq: Every day | ORAL | Status: DC
  Administered 2022-04-24 – 2022-04-25 (×2): 40 mg via ORAL
  Filled 2022-04-24 (×2): qty 2

## 2022-04-24 MED ORDER — IPRATROPIUM-ALBUTEROL 0.5-2.5 (3) MG/3ML IN SOLN: 3.0000 mL | Freq: Four times a day (QID) | RESPIRATORY_TRACT | Status: DC | PRN

## 2022-04-24 MED ORDER — ACETAMINOPHEN 650 MG RE SUPP: 650.0000 mg | Freq: Four times a day (QID) | RECTAL | Status: DC | PRN

## 2022-04-24 MED ORDER — IPRATROPIUM-ALBUTEROL 0.5-2.5 (3) MG/3ML IN SOLN
3.0000 mL | Freq: Once | RESPIRATORY_TRACT | Status: AC
  Administered 2022-04-24: 3 mL via RESPIRATORY_TRACT
  Filled 2022-04-24: qty 3

## 2022-04-24 MED ORDER — HYDRALAZINE HCL 20 MG/ML IJ SOLN: 10.0000 mg | Freq: Four times a day (QID) | INTRAMUSCULAR | Status: DC | PRN

## 2022-04-24 NOTE — ED Notes (Signed)
Warm blanket provided to patient. Bed in lowest position and call light within reach. Patient denies additional needs or additional concerns at this time.

## 2022-04-24 NOTE — ED Notes (Signed)
Pt ambulated to the bathroom, increased SOB, HR and decreased O2. MD bedside and aware. 3L [place] applied and MD placed orders. Warm blanket given to pt

## 2022-04-24 NOTE — ED Notes (Signed)
Iona Beard @ [facility] states short on staff/trucks. Suggested we call [facility].

## 2022-04-24 NOTE — Evaluation (Signed)
Physical Therapy Evaluation - one-time eval  Patient Details Name: Virginia Watson MRN: [id number] DOB: 1945-10-11 Today's Date: 04/24/2022  History of Present Illness  76 yo female [facility] with productive cough, ShOB. Workup for acute hypoxic respiratory failure with history of COPD. PMH includes COPD, HTN, heart murmur, sciatica, scoliosis.  Clinical Impression    Pt presents with DOE requiring O2 during mobility, but pt is at baseline level of functioning from a PT standpoint. Pt with WFL strength, balance, gait, and activity tolerance. Pt does require min cues for pursed lip breathing technique to recover dyspnea and SpO2, but demonstrates technique well. Pt with no further acute PT needs, PT to sign off, thank you.   SATURATION QUALIFICATIONS: (This note is used to comply with regulatory documentation for home oxygen)  Patient Saturations on Room Air at Rest = 93%  Patient Saturations on Room Air while Ambulating = 85%  Patient Saturations on 2 Liters of oxygen while Ambulating = 88%  Please briefly explain why patient needs home oxygen:  to maintain SpO2     Recommendations for follow up therapy are one component of a multi-disciplinary discharge planning process, led by the attending physician.  Recommendations may be updated based on patient status, additional functional criteria and insurance authorization.  Follow Up Recommendations No PT follow up      Assistance Recommended at Discharge None  Patient can return home with the following       Equipment Recommendations None recommended by PT  Recommendations for Other Services       Functional Status Assessment Patient has not had a recent decline in their functional status     Precautions / Restrictions Precautions Precautions: Fall Restrictions Weight Bearing Restrictions: No      Mobility  Bed Mobility Overal bed mobility: Independent                  Transfers Overall  transfer level: Independent                      Ambulation/Gait Ambulation/Gait assistance: Modified independent (Device/Increase time) Gait Distance (Feet): 220 Feet Assistive device: None Gait Pattern/deviations: Step-through pattern, Decreased stride length Gait velocity: decr     General Gait Details: min increased time, but WFL gait parameters. DOE 2/4 with SPO2 min on RA 85%, PT placed pt back on 2LO2.  Stairs            Wheelchair Mobility    Modified Rankin (Stroke Patients Only)       Balance Overall balance assessment: Modified Independent                                           Pertinent Vitals/Pain Pain Assessment Pain Assessment: Faces Faces Pain Scale: Hurts a little bit Pain Location: back Pain Descriptors / Indicators: Sore Pain Intervention(s): Premedicated before session    Home Living Family/patient expects to be discharged to:: Private residence Living Arrangements: Alone Available Help at Discharge: Family;Available PRN/intermittently Type of Home: Apartment Home Access: Stairs to enter   Entrance Stairs-Number of Steps: 1   Home Layout: One level Home Equipment: None      Prior Function Prior Level of Function : Independent/Modified Independent;Needs assist               ADLs Comments: some assist for cleaning from niece, pt able  to cook for herself     Hand Dominance   Dominant Hand: Right    Extremity/Trunk Assessment   Upper Extremity Assessment Upper Extremity Assessment: Defer to OT evaluation    Lower Extremity Assessment Lower Extremity Assessment: Overall WFL for tasks assessed    Cervical / Trunk Assessment Cervical / Trunk Assessment: Normal  Communication   Communication: No difficulties  Cognition Arousal/Alertness: Awake/alert Behavior During Therapy: WFL for tasks assessed/performed Overall Cognitive Status: Within Functional Limits for tasks assessed                                           General Comments      Exercises Other Exercises Other Exercises: reviewed pursed lip breathing technique, pt with good demonstration   Assessment/Plan    PT Assessment Patient does not need any further PT services  PT Problem List         PT Treatment Interventions      PT Goals (Current goals can be found in the Care Plan section)  Acute Rehab PT Goals Patient Stated Goal: home PT Goal Formulation: With patient Time For Goal Achievement: 04/24/22 Potential to Achieve Goals: Good    Frequency       Co-evaluation               AM-PAC PT "6 Clicks" Mobility  Outcome Measure Help needed turning from your back to your side while in a flat bed without using bedrails?: None Help needed moving from lying on your back to sitting on the side of a flat bed without using bedrails?: None Help needed moving to and from a bed to a chair (including a wheelchair)?: None Help needed standing up from a chair using your arms (e.g., wheelchair or bedside chair)?: None Help needed to walk in hospital room?: None Help needed climbing 3-5 steps with a railing? : None 6 Click Score: 24    End of Session Equipment Utilized During Treatment: Oxygen Activity Tolerance: Patient tolerated treatment well Patient left: in bed;with call bell/phone within reach;with family/visitor present Nurse Communication: Mobility status PT Visit Diagnosis: Other abnormalities of gait and mobility (R26.89)    Time: 5681-2751 PT Time Calculation (min) (ACUTE ONLY): 20 min   Charges:   PT Evaluation $PT Eval Low Complexity: 1 Low        Stacie Glaze, PT DPT Acute Rehabilitation Services Pager [phone]  Office [phone]'   Lisabeth Mian E Stroup 04/24/2022, 4:49 PM

## 2022-04-24 NOTE — ED Notes (Signed)
RT note: Pt. seen, resting comfortably and assessed, remains on 3 lpm n/c, good aeration with few b/l late/end loose wheezes, states, "breathing better than I was", given 1/2 cup of water w/straw, RN covering made aware, RT to monitor.

## 2022-04-24 NOTE — ED Provider Notes (Signed)
[facility] EMERGENCY DEPT Provider Note   CSN: [id number] Arrival date & time: 04/23/22  1647     History  Chief Complaint  Patient presents with   Shortness of Breath    Virginia Watson is a 76 y.o. female.  The history is provided by the patient.  Patient history hypertension, possible COPD presents with cough and shortness of breath.  She reports cough and short of breath over the past 2 days.  No chest pain.  No fevers or vomiting.  She reports chronic dyspnea on exertion.  Patient reports she is a current smoker. She has used albuterol at home without relief. No chest pain or back pain. No lower extremity edema    Past Medical History:  Diagnosis Date   Allergy    Bone spur 04/2021   spine   COPD (chronic obstructive pulmonary disease) (HCC)    per pt told it was seen on an xray from an ER visit, not being treated   Heart murmur    Hypertension    Sciatica 04/2021   Scoliosis 04/2021    Home Medications Prior to Admission medications   Medication Sig Start Date End Date Taking? Authorizing Provider  albuterol (VENTOLIN HFA) 108 (90 Base) MCG/ACT inhaler Inhale 2 puffs into the lungs every 6 (six) hours as needed for wheezing or shortness of breath. 05/22/20   Spero Geralds, MD  amLODipine (NORVASC) 2.5 MG tablet Take 2.5 mg by mouth daily. 01/09/22   [provider]  cholecalciferol (VITAMIN D3) 25 MCG (1000 UNIT) tablet Take 1,000 Units by mouth daily.    [provider]  dicyclomine (BENTYL) 20 MG tablet Take 1 tablet (20 mg total) by mouth every 6 (six) hours as needed for spasms. 04/08/22   Levin Erp, PA  docusate sodium (COLACE) 50 MG capsule Take 50 mg by mouth 2 (two) times daily as needed for mild constipation.    [provider]  HYDROcodone-acetaminophen (NORCO/VICODIN) 5-325 MG tablet Take 1 tablet by mouth every 6 (six) hours as needed. Patient not taking: Reported on 04/08/2022 12/04/21    [provider]  Iron-Vitamins (GERITOL COMPLETE PO) Take 1 tablet by mouth daily.    [provider]  megestrol (MEGACE) 40 MG/ML suspension Take 10 mLs (400 mg total) by mouth daily. 03/29/22   Ailene Ards, NP  metoprolol tartrate (LOPRESSOR) 25 MG tablet TAKE 1 TABLET(25 MG) BY MOUTH TWICE DAILY 03/06/20   Billie Ruddy, MD  pantoprazole (PROTONIX) 40 MG tablet Take 1 tablet (40 mg total) by mouth daily. 02/09/20   Thornton Park, MD      Allergies    Patient has no known allergies.    Review of Systems   Review of Systems  Constitutional:  Negative for fever.  Respiratory:  Positive for cough.   Cardiovascular:  Negative for chest pain.    Physical Exam Updated Vital Signs BP (!) 154/99   Pulse 78   Temp 98.2 F (36.8 C) (Oral)   Resp 16   Ht 1.651 m ('5\' 5"'$ )   Wt 72.1 kg   SpO2 99%   BMI 26.45 kg/m  Physical Exam CONSTITUTIONAL: Well developed/well nourished HEAD: Normocephalic/atraumatic EYES: EOMI/PERRL ENMT: Mucous membranes moist, uvula midline, no erythema or exudate, no stridor NECK: supple no meningeal signs SPINE/BACK:entire spine nontender CV: S1/S2 noted, no murmurs/rubs/gallops noted LUNGS: Wheezing noted in upper lung fields, no distress ABDOMEN: soft, nontender, no rebound or guarding, bowel sounds noted throughout abdomen GU:no cva  tenderness NEURO: Pt is awake/alert/appropriate, moves all extremitiesx4.  No facial droop.   EXTREMITIES: pulses normal/equal, full ROM, no lower extremity edema SKIN: warm, color normal PSYCH: no abnormalities of mood noted, alert and oriented to situation  ED Results / Procedures / Treatments   Labs (all labs ordered are listed, but only abnormal results are displayed) Labs Reviewed  BASIC METABOLIC PANEL - Abnormal; Notable for the following components:      Result Value   Creatinine, Ser 1.24 (*)    Calcium 10.9 (*)    GFR, Estimated 46 (*)    All other components within normal limits   RESP PANEL BY RT-PCR (RSV, FLU A&B, COVID)  RVPGX2  CBC  BRAIN NATRIURETIC PEPTIDE  HIV ANTIBODY (ROUTINE TESTING W REFLEX)  TROPONIN I (HIGH SENSITIVITY)    EKG EKG Interpretation  Date/Time:  Tuesday April 23 2022 17:01:59 EST Ventricular Rate:  91 PR Interval:  152 QRS Duration: 64 QT Interval:  344 QTC Calculation: 423 R Axis:   82 Text Interpretation: Normal sinus rhythm Normal ECG No significant change since last tracing Confirmed by Ripley Fraise [phone]) on 04/23/2022 11:59:10 PM  Radiology DG Chest 2 View  Result Date: 04/23/2022 CLINICAL DATA:  Shortness of breath and cough for 2 days EXAM: CHEST - 2 VIEW COMPARISON:  01/10/2022 FINDINGS: Cardiac shadow is within normal limits. Aortic calcifications are noted. Lungs are clear bilaterally. No bony abnormality is seen. IMPRESSION: No active cardiopulmonary disease. Electronically Signed   By: Inez Catalina M.D.   On: 04/23/2022 17:34    Procedures .Critical Care  Performed by: Ripley Fraise, MD Authorized by: Ripley Fraise, MD   Critical care provider statement:    Critical care time (minutes):  60   Critical care start time:  04/24/2022 1:45 AM   Critical care end time:  04/24/2022 2:45 AM   Critical care time was exclusive of:  Separately billable procedures and treating other patients   Critical care was necessary to treat or prevent imminent or life-threatening deterioration of the following conditions:  Respiratory failure   Critical care was time spent personally by me on the following activities:  Examination of patient, development of treatment plan with patient or surrogate, ordering and review of laboratory studies, ordering and review of radiographic studies, pulse oximetry, re-evaluation of patient's condition and ordering and performing treatments and interventions   I assumed direction of critical care for this patient from another provider in my specialty: no     Care discussed with: admitting  provider       Medications Ordered in ED Medications  cefTRIAXone (ROCEPHIN) 1 g in sodium chloride 0.9 % 100 mL IVPB (has no administration in time range)  predniSONE (DELTASONE) tablet 40 mg (has no administration in time range)  mometasone-formoterol (DULERA) 200-5 MCG/ACT inhaler 2 puff (has no administration in time range)  albuterol (PROVENTIL) (2.5 MG/3ML) 0.083% nebulizer solution 2.5 mg (has no administration in time range)  umeclidinium bromide (INCRUSE ELLIPTA) 62.5 MCG/ACT 1 puff (has no administration in time range)  ipratropium-albuterol (DUONEB) 0.5-2.5 (3) MG/3ML nebulizer solution 3 mL (3 mLs Nebulization Given 04/24/22 0029)  predniSONE (DELTASONE) tablet 60 mg (60 mg Oral Given 04/24/22 0022)  albuterol (PROVENTIL) (2.5 MG/3ML) 0.083% nebulizer solution 10 mg (10 mg Nebulization Given 04/24/22 0157)    ED Course/ Medical Decision Making/ A&P Clinical Course as of 04/24/22 0501  Tue Apr 23, 2022  2358 Creatinine(!): 1.24 Mild renal insufficiency [DW]  Wed Apr 24, 2022  0209 Patient  with continued wheezing after ambulation, will give continuous treatment [DW]  [phone] Patient given multiple treatments with still wheezing.  Still requiring oxygen.  She will need to be admitted [DW]  0500 Discussed with Dr. Alcario Drought for admission.  Patient be admitted for acute hypoxic respiratory failure with history of COPD.  While rest, patient is protecting her airway and resting comfortably.  With any movement she becomes tachypneic and hypoxic. [DW]    Clinical Course User Index [DW] Ripley Fraise, MD                           Medical Decision Making Amount and/or Complexity of Data Reviewed Labs: ordered. Decision-making details documented in ED Course. Radiology: ordered.  Risk Prescription drug management. Decision regarding hospitalization.   This patient presents to the ED for concern of shortness of breath, this involves an extensive number of treatment options, and  is a complaint that carries with it a high risk of complications and morbidity.  The differential diagnosis includes but is not limited to Acute coronary syndrome, pneumonia, acute pulmonary edema, pneumothorax, acute anemia, pulmonary embolism    Comorbidities that complicate the patient evaluation: Patient's presentation is complicated by their history of COPD and hypertension  Social Determinants of Health: Patient's  ongoing tobacco use   increases the complexity of managing their presentation  Additional history obtained: Records reviewed  outpatient records  Lab Tests: I Ordered, and personally interpreted labs.  The pertinent results include: Renal insufficiency  Imaging Studies ordered: I ordered imaging studies including X-ray chest   I independently visualized and interpreted imaging which showed no acute findings I agree with the radiologist interpretation  Cardiac Monitoring: The patient was maintained on a cardiac monitor.  I personally viewed and interpreted the cardiac monitor which showed an underlying rhythm of:  sinus rhythm  Medicines ordered and prescription drug management: I ordered medication including nebulized treatments for wheezing Reevaluation of the patient after these medicines showed that the patient    stayed the same  Critical Interventions:   nebulizer therapies  Consultations Obtained: I requested consultation with the admitting physician Dr. Alcario Drought , and discussed  findings as well as pertinent plan - they recommend: Admit  Reevaluation: After the interventions noted above, I reevaluated the patient and found that they have :stayed the same  Complexity of problems addressed: Patient's presentation is most consistent with  acute presentation with potential threat to life or bodily function  Disposition: After consideration of the diagnostic results and the patient's response to treatment,  I feel that the patent would benefit from  admission   .           Final Clinical Impression(s) / ED Diagnoses Final diagnoses:  Acute respiratory failure with hypoxia [facility])    Rx / DC Orders ED Discharge Orders     None         Ripley Fraise, MD 04/24/22 0502

## 2022-04-24 NOTE — ED Notes (Signed)
Per EDP, ambulating patient without Room Air is not needed.

## 2022-04-24 NOTE — ED Notes (Signed)
Rick @ [facility] will send transport asap/short on trucks too. PT is going to [id number])

## 2022-04-24 NOTE — H&P (Signed)
[facility] History and Physical  Virginia Watson EGB:[id number] DOB: 02/26/1946 DOA: 04/23/2022 PCP: Ailene Ards, NP  Admitted from: home Chief Complaint: dyspnea  History of Present Illness: Virginia Watson is a 76 y.o. female with PMH significant for COPD, smokes 1 pack/day, HTN 12/26, patient presented to ED at [facility] with complaint shortness of breath, productive cough worsening for 2 days.  No fever, chest pain.  No history of pulm embolism. In the ED, patient was afebrile, heart rate close to 100, blood pressure over 140 mostly, initially on room air, later required 2 L oxygen by nasal cannula Labs with unremarkable CBC.  BMP with creatinine elevated 1.24, troponin normal Respiratory virus panel negative for flu, RSV Chest x-ray did not show any acute cardiopulmonary disease. Patient was started on IV Rocephin, steroids Hospitalist service was consulted for admission and management. She waited for about 24 hours in the ED before getting an inpatient bed here at [facility] for transfer. At the time of my evaluation, patient was sitting up at the edge of the bed. On 2 L oxygen medical cannula.  Sister at bedside.  Review of Systems:  All systems were reviewed and were negative unless otherwise mentioned in the HPI   Past medical history: Past Medical History:  Diagnosis Date   Allergy    Bone spur 04/2021   spine   COPD (chronic obstructive pulmonary disease) ([place])    per pt told it was seen on an xray from an ER visit, not being treated   Heart murmur    Hypertension    Sciatica 04/2021   Scoliosis 04/2021    Past surgical history: Past Surgical History:  Procedure Laterality Date   UPPER GASTROINTESTINAL ENDOSCOPY      Social History:  reports that she has been smoking cigarettes. She has a 12.50 pack-year smoking history. She has never used smokeless tobacco. She reports current alcohol use. She reports that she does not use drugs.  Allergies:   No Known Allergies Patient has no known allergies.   Family history:  Family History  Problem Relation Age of Onset   Alcohol abuse Mother    Asthma Mother    Hyperlipidemia Mother    Alcohol abuse Father    Heart attack Father    Depression Sister    Hypertension Sister    Colon polyps Sister    Drug abuse Brother    Alcohol abuse Brother    Colon cancer Brother    Esophageal cancer Neg Hx    Stomach cancer Neg Hx    Rectal cancer Neg Hx    Breast cancer Neg Hx      Home Meds: Prior to Admission medications   Medication Sig Start Date End Date Taking? Authorizing Provider  albuterol (VENTOLIN HFA) 108 (90 Base) MCG/ACT inhaler Inhale 2 puffs into the lungs every 6 (six) hours as needed for wheezing or shortness of breath. 05/22/20   Spero Geralds, MD  amLODipine (NORVASC) 2.5 MG tablet Take 2.5 mg by mouth daily. 01/09/22   [provider]  cholecalciferol (VITAMIN D3) 25 MCG (1000 UNIT) tablet Take 1,000 Units by mouth daily.    [provider]  dicyclomine (BENTYL) 20 MG tablet Take 1 tablet (20 mg total) by mouth every 6 (six) hours as needed for spasms. 04/08/22   Levin Erp, PA  docusate sodium (COLACE) 50 MG capsule Take 50 mg by mouth 2 (two) times daily as needed for mild constipation.    [provider]  Iron-Vitamins (GERITOL COMPLETE PO) Take 1 tablet by mouth daily.    [provider]  megestrol (MEGACE) 40 MG/ML suspension Take 10 mLs (400 mg total) by mouth daily. 03/29/22   Ailene Ards, NP  metoprolol tartrate (LOPRESSOR) 25 MG tablet TAKE 1 TABLET(25 MG) BY MOUTH TWICE DAILY 03/06/20   Billie Ruddy, MD  pantoprazole (PROTONIX) 40 MG tablet Take 1 tablet (40 mg total) by mouth daily. 02/09/20   Thornton Park, MD    Physical Exam: Vitals:   04/24/22 1200 04/24/22 1230 04/24/22 1300 04/24/22 1400  BP: (!) 146/87 (!) 142/86 (!) 144/94 (!) 159/87  Pulse: 82 81 78 82  Resp: '20 15 13   '$ Temp:   98.2 F  (36.8 C)   TempSrc:   Oral   SpO2: 100% 97% 96%   Weight:      Height:       Wt Readings from Last 3 Encounters:  04/23/22 72.1 kg  04/08/22 72.1 kg  03/29/22 71.7 kg   Body mass index is 26.45 kg/m.  General exam: Pleasant, elderly African-American female.  Not in distress Skin: No rashes, lesions or ulcers. HEENT: Atraumatic, normocephalic, no obvious bleeding Lungs: Mild scattered wheezing bilaterally CVS: Regular rate and rhythm, no murmur GI/Abd soft, nontender, nondistended, bowel sound present CNS: Alert, awake, oriented x 3 Psychiatry: Mood appropriate Extremities: No pedal edema, no calf tenderness     Consult Orders  (From admission, onward)           Start     Ordered   04/24/22 0444  PT eval and treat  Routine       Question:  Reason for PT?  Answer:  COPD   04/24/22 0444   04/24/22 0444  OT eval and treat  Routine       Question:  Reason for OT?  Answer:  COPD   04/24/22 0444   04/24/22 0444  Consult to respiratory care treatment  Once       Provider:  (Not yet assigned)  Question:  Reason for Consult?  Answer:  Educate patient regarding use of nebulizers and MDI   04/24/22 0444   04/24/22 0444  Consult to Registered Dietitian  Once       Provider:  (Not yet assigned)  Question:  Reason for consult?  Answer:  Assessment of nutrition requirements/status   04/24/22 0444            Labs on Admission:   CBC: Recent Labs  Lab 04/23/22 1715  WBC 5.0  HGB 12.4  HCT 36.0  MCV 89.3  PLT 867    Basic Metabolic Panel: Recent Labs  Lab 04/23/22 2300  NA 140  K 4.0  CL 107  CO2 22  GLUCOSE 87  BUN 17  CREATININE 1.24*  CALCIUM 10.9*    Liver Function Tests: No results for input(s): "AST", "ALT", "ALKPHOS", "BILITOT", "PROT", "ALBUMIN" in the last 168 hours. No results for input(s): "LIPASE", "AMYLASE" in the last 168 hours. No results for input(s): "AMMONIA" in the last 168 hours.  Cardiac Enzymes: No results for input(s):  "CKTOTAL", "CKMB", "CKMBINDEX", "TROPONINI" in the last 168 hours.  BNP (last 3 results) Recent Labs    04/23/22 1715  BNP 13.4    ProBNP (last 3 results) No results for input(s): "PROBNP" in the last 8760 hours.  CBG: No results for input(s): "GLUCAP" in the last 168 hours.  Lipase  No results found for: "LIPASE"   Urinalysis  Component Value Date/Time   COLORURINE YELLOW 03/20/2020 1640   APPEARANCEUR CLEAR 03/20/2020 1640   LABSPEC 1.012 03/20/2020 1640   PHURINE 6.0 03/20/2020 1640   GLUCOSEU NEGATIVE 03/20/2020 1640   HGBUR NEGATIVE 03/20/2020 1640   BILIRUBINUR NEGATIVE 03/20/2020 1640   KETONESUR NEGATIVE 03/20/2020 1640   PROTEINUR NEGATIVE 03/20/2020 1640   NITRITE NEGATIVE 03/20/2020 1640   LEUKOCYTESUR SMALL (A) 03/20/2020 1640     Drugs of Abuse  No results found for: "LABOPIA", "COCAINSCRNUR", "LABBENZ", "AMPHETMU", "THCU", "LABBARB"    Radiological Exams on Admission: DG Chest 2 View  Result Date: 04/23/2022 CLINICAL DATA:  Shortness of breath and cough for 2 days EXAM: CHEST - 2 VIEW COMPARISON:  01/10/2022 FINDINGS: Cardiac shadow is within normal limits. Aortic calcifications are noted. Lungs are clear bilaterally. No bony abnormality is seen. IMPRESSION: No active cardiopulmonary disease. Electronically Signed   By: Inez Catalina M.D.   On: 04/23/2022 17:34     ------------------------------------------------------------------------------------------------------ Assessment/Plan: Principal Problem:   COPD exacerbation ([place])  Acute exacerbation of COPD Chronic daily smoker Presented with shortness of breath, cough for 2 days in the setting of 1 pack/day smoking for several years Chest x-ray without any acute infiltrates WBC count normal.  No fever.  Obtain procalcitonin level She was started on IV Rocephin in the ED which I will continue for now.  If procalcitonin level is negative, I will downgrade to oral doxycycline for anti-inflammatory  purpose. Continue prednisone 40 mg daily Start DuoNeb Patient reports she does not use any inhalers or nebulizers at home. Recent Labs  Lab 04/23/22 1715  WBC 5.0   Hypertension Continue amlodipine 2.5 mg daily  CKD stage IIIb Creatinine seems to be at baseline. Recent Labs    01/02/22 1554 03/01/22 1353 04/23/22 2300  BUN '19 16 17  '$ CREATININE 1.34* 1.27* 1.24*   Lung nodules Screening CT scan of lung from September 2023 showed two small left lung nodules up to 3.3 mm. No suspicious lung nodules identified.     Mobility: Encourage ambulation  Goals of care   Code Status: Full Code   Diet: Diet Order             Diet regular Room service appropriate? Yes; Fluid consistency: Thin  Diet effective now                   Nutritional status:  Body mass index is 26.45 kg/m.       DVT prophylaxis:  enoxaparin (LOVENOX) injection 40 mg Start: 04/24/22 2200   Antimicrobials: IV Rocephin Fluid: None Consultants: None Family Communication: Sister at bedside Dispo: The patient is from: Home              Anticipated d/c is to: Home in 1 to 2 days  ------------------------------------------------------------------------------------- Severity of Illness: The appropriate patient status for this patient is OBSERVATION. Observation status is judged to be reasonable and necessary in order to provide the required intensity of service to ensure the patient's safety. The patient's presenting symptoms, physical exam findings, and initial radiographic and laboratory data in the context of their medical condition is felt to place them at decreased risk for further clinical deterioration. Furthermore, it is anticipated that the patient will be medically stable for discharge from the hospital within 2 midnights of admission.   Signed, Terrilee Croak, MD [facility] 04/24/2022

## 2022-04-24 NOTE — ED Notes (Signed)
Assumed care 0700. Patient asleep at this time in NAD. Resting comfortably. Call bell within reach.

## 2022-04-24 NOTE — ED Notes (Signed)
Patient removed from oxygen at this time. RN aware.

## 2022-04-24 NOTE — ED Notes (Signed)
Ambulatory to bathroom in NAD

## 2022-04-25 DIAGNOSIS — J441 Chronic obstructive pulmonary disease with (acute) exacerbation: Secondary | ICD-10-CM | POA: Diagnosis not present

## 2022-04-25 LAB — CBC
HCT: 31.8 % — ABNORMAL LOW (ref 36.0–46.0)
Hemoglobin: 10.9 g/dL — ABNORMAL LOW (ref 12.0–15.0)
MCH: 30.4 pg (ref 26.0–34.0)
MCHC: 34.3 g/dL (ref 30.0–36.0)
MCV: 88.8 fL (ref 80.0–100.0)
Platelets: 169 10*3/uL (ref 150–400)
RBC: 3.58 MIL/uL — ABNORMAL LOW (ref 3.87–5.11)
RDW: 13.2 % (ref 11.5–15.5)
WBC: 6.8 10*3/uL (ref 4.0–10.5)
nRBC: 0 % (ref 0.0–0.2)

## 2022-04-25 LAB — BASIC METABOLIC PANEL
Anion gap: 7 (ref 5–15)
BUN: 19 mg/dL (ref 8–23)
CO2: 23 mmol/L (ref 22–32)
Calcium: 10.2 mg/dL (ref 8.9–10.3)
Chloride: 108 mmol/L (ref 98–111)
Creatinine, Ser: 1.45 mg/dL — ABNORMAL HIGH (ref 0.44–1.00)
GFR, Estimated: 38 mL/min — ABNORMAL LOW (ref >60)
Glucose, Bld: 125 mg/dL — ABNORMAL HIGH (ref 70–99)
Potassium: 4 mmol/L (ref 3.5–5.1)
Sodium: 138 mmol/L (ref 135–145)

## 2022-04-25 LAB — PROCALCITONIN: Procalcitonin: <0.1 ng/mL

## 2022-04-25 MED ORDER — IPRATROPIUM-ALBUTEROL 0.5-2.5 (3) MG/3ML IN SOLN: 3.0000 mL | Freq: Four times a day (QID) | RESPIRATORY_TRACT | 0 refills | Status: DC | PRN

## 2022-04-25 MED ORDER — MOMETASONE FURO-FORMOTEROL FUM 200-5 MCG/ACT IN AERO: 2.0000 | INHALATION_SPRAY | Freq: Two times a day (BID) | RESPIRATORY_TRACT | 0 refills | Status: DC

## 2022-04-25 MED ORDER — DOXYCYCLINE HYCLATE 100 MG PO TABS
100.0000 mg | ORAL_TABLET | Freq: Two times a day (BID) | ORAL | Status: DC
  Administered 2022-04-25: 100 mg via ORAL
  Filled 2022-04-25: qty 1

## 2022-04-25 MED ORDER — GUAIFENESIN-DM 100-10 MG/5ML PO SYRP: 5.0000 mL | ORAL_SOLUTION | ORAL | 0 refills | Status: DC | PRN

## 2022-04-25 MED ORDER — PREDNISONE 10 MG PO TABS: ORAL_TABLET | ORAL | 0 refills | Status: DC

## 2022-04-25 MED ORDER — ALBUTEROL SULFATE HFA 108 (90 BASE) MCG/ACT IN AERS: 2.0000 | INHALATION_SPRAY | Freq: Four times a day (QID) | RESPIRATORY_TRACT | 0 refills | Status: DC | PRN

## 2022-04-25 MED ORDER — DOXYCYCLINE HYCLATE 100 MG PO TABS: 100.0000 mg | ORAL_TABLET | Freq: Two times a day (BID) | ORAL | 0 refills | Status: AC

## 2022-04-25 NOTE — Progress Notes (Signed)
Reviewed and agree with management plans. ? ?Enya Bureau L. Brigitta Pricer, MD, MPH  ?

## 2022-04-25 NOTE — TOC Initial Note (Signed)
Transition of Care [facility]) - Initial/Assessment Note    Patient Details  Name: Virginia Watson MRN: [id number] Date of Birth: [birth year]  Transition of Care [facility]) CM/SW Contact:    Curlene Labrum, RN Phone Number: 04/25/2022, 11:41 AM  Clinical Narrative:                 CM met with the patient at the bedside to discuss transitions of care need for return to home today.  The patient lives alone with assistance from her sister - present at the bedside.  The patient was given Medicare choice regarding DME company and the patient did not have a preference.  I called [employer] and home oxygen was ordered - plan to deliver to the bedside in the next 1 hour.  The patient's sister plans to provide transportation to the home via car.  Smoking cessation will be included in the discharge instructions along with oxygen safety.  Expected Discharge Plan: Home/Self Care Barriers to Discharge: No Barriers Identified   Patient Goals and CMS Choice Patient states their goals for this hospitalization and ongoing recovery are:: to get better and go home CMS Medicare.gov Compare Post Acute Care list provided to:: Patient Choice offered to / list presented to : Patient      Expected Discharge Plan and Services   Discharge Planning Services: CM Consult Post Acute Care Choice: Durable Medical Equipment Living arrangements for the past 2 months: Apartment Expected Discharge Date: 04/25/22               DME Arranged: Oxygen DME Agency: [employer] Date DME Agency Contacted: 04/25/22 Time DME Agency Contacted: [phone] Representative spoke with at DME Agency: Brenton Grills, [place] with [employer]            Prior Living Arrangements/Services Living arrangements for the past 2 months: [place] with:: Self Patient language and need for interpreter reviewed:: Yes Do you feel safe going back to the place where you live?: Yes        Care giver support system in place?:  Yes (comment)   Criminal Activity/Legal Involvement Pertinent to Current Situation/Hospitalization: No - Comment as needed  Activities of Daily Living Home Assistive Devices/Equipment: None ADL Screening (condition at time of admission) Patient's cognitive ability adequate to safely complete daily activities?: Yes Is the patient deaf or have difficulty hearing?: No Does the patient have difficulty seeing, even when wearing glasses/contacts?: No Does the patient have difficulty concentrating, remembering, or making decisions?: No Patient able to express need for assistance with ADLs?: Yes Does the patient have difficulty dressing or bathing?: No Independently performs ADLs?: Yes (appropriate for developmental age) Does the patient have difficulty walking or climbing stairs?: No Weakness of Legs: Both Weakness of Arms/Hands: Both  Permission Sought/Granted Permission sought to share information with : Case Manager Permission granted to share information with : Yes, Verbal Permission Granted     Permission granted to share info w AGENCY: [facility] for home oxygen  Permission granted to share info w Relationship: sister     Emotional Assessment Appearance:: Appears stated age Attitude/Demeanor/Rapport: Gracious Affect (typically observed): Accepting Orientation: : Oriented to Self, Oriented to Place, Oriented to  Time, Oriented to Situation Alcohol / Substance Use: Tobacco Use (Plans on quitting) Psych Involvement: No (comment)  Admission diagnosis:  COPD exacerbation (HCC) [J44.1] Acute respiratory failure with hypoxia ([place]) [J96.01] Patient Active Problem List   Diagnosis Date Noted   COPD exacerbation ([place]) 04/24/2022   Osteopenia  03/29/2022   COPD (chronic obstructive pulmonary disease) ([place]) 03/29/2022   Chronic bilateral low back pain with bilateral sciatica 03/01/2022   Hypercalcemia 03/01/2022   Stage 3b chronic kidney disease ([place]) 03/01/2022   Need for vaccination  03/01/2022   Unintentional weight loss 03/01/2022   Constipation 03/01/2022   Lumbar spine pain 09/07/2021   COPD with acute exacerbation ([place]) 03/21/2020   Respiratory failure with hypoxia ([place]) 56/97/9480   Helicobacter pylori infection 03/20/2020   Cigarette nicotine dependence without complication 16/55/3748   Seasonal allergies 10/23/2017   Hypertension 10/13/2017   Hypertensive urgency 10/13/2017   Epistaxis 10/13/2017   PCP:  Ailene Ards, NP Pharmacy:   [facility] [place], [place] [address] [place] [phone] [place] [place] [address] Phone: [phone] Fax: [phone]  [place], [place] [address] [place] [address] Phone: [phone] Fax: [phone]     Social Determinants of Health (SDOH) Social History: [place]: Food Insecurity Present (04/24/2022)  Housing: Low Risk  (04/24/2022)  Transportation Needs: No Transportation Needs (04/24/2022)  Utilities: Not At Risk (04/24/2022)  Alcohol Screen: Low Risk  (03/28/2022)  Depression (PHQ2-9): Low Risk  (03/28/2022)  Recent Concern: Depression (PHQ2-9) - High Risk (03/01/2022)  Financial Resource Strain: Low Risk  (03/28/2022)  Physical Activity: Inactive (03/28/2022)  Social Connections: Socially Isolated (03/28/2022)  Stress: No Stress Concern Present (03/28/2022)  Tobacco Use: High Risk (04/23/2022)   SDOH Interventions:     Readmission Risk Interventions     No data to display

## 2022-04-25 NOTE — Progress Notes (Signed)
O2 walking test with patient, patient o2 SPO2 on room air while walking went to 86%, 2l O2 applied and SPO2 was 90% while walking

## 2022-04-25 NOTE — Evaluation (Signed)
Occupational Therapy Evaluation Patient Details Name: Virginia Watson MRN: [id number] DOB: [birth year] Today's Date: 04/25/2022   History of Present Illness 76 yo female [facility] [place] on 12/26 with productive cough, ShOB. Workup for acute hypoxic respiratory failure with history of COPD. PMH includes COPD, HTN, heart murmur, sciatica, scoliosis.   Clinical Impression   Pt reports independence at baseline with ADLs and functional mobility, lives alone but has family available for PRN assist. Pt currently needing up to supervision level for ADLs, ind with bed mobility and simulated toilet/tub transfers. SpO2 above 90% on 2L O2 throughout session. Educated pt on energy conservation strategies for home, pt verbalized understanding. Pt presenting with impairments listed below, will follow acutely. Anticipate no OT follow up needs at d/c.     Recommendations for follow up therapy are one component of a multi-disciplinary discharge planning process, led by the attending physician.  Recommendations may be updated based on patient status, additional functional criteria and insurance authorization.   Follow Up Recommendations  No OT follow up     Assistance Recommended at Discharge Set up Supervision/Assistance  Patient can return home with the following A little help with bathing/dressing/bathroom    Functional Status Assessment  Patient has had a recent decline in their functional status and demonstrates the ability to make significant improvements in function in a reasonable and predictable amount of time.  Equipment Recommendations  None recommended by OT (pt has all needed DME)    Recommendations for Other Services PT consult     Precautions / Restrictions Precautions Precautions: Fall Restrictions Weight Bearing Restrictions: No      Mobility Bed Mobility Overal bed mobility: Independent                  Transfers Overall transfer level: Independent                         Balance Overall balance assessment: Needs assistance Sitting-balance support: Feet supported Sitting balance-Leahy Scale: Good Sitting balance - Comments: can reach outside BOS without LOB   Standing balance support: During functional activity Standing balance-Leahy Scale: Good Standing balance comment: can step over simulated tub without LOB                           ADL either performed or assessed with clinical judgement   ADL   Eating/Feeding: Independent   Grooming: Independent   Upper Body Bathing: Supervision/ safety   Lower Body Bathing: Supervison/ safety   Upper Body Dressing : Supervision/safety   Lower Body Dressing: Supervision/safety   Toilet Transfer: Independent   Toileting- Clothing Manipulation and Hygiene: Supervision/safety       Functional mobility during ADLs: Supervision/safety       Vision   Vision Assessment?: No apparent visual deficits     Perception Perception Perception Tested?: No   Praxis Praxis Praxis tested?: Not tested    Pertinent Vitals/Pain Pain Assessment Pain Assessment: Faces Pain Score: 4  Faces Pain Scale: Hurts little more Pain Location: back Pain Descriptors / Indicators: Discomfort Pain Intervention(s): Limited activity within patient's tolerance, Monitored during session     Hand Dominance Right   Extremity/Trunk Assessment Upper Extremity Assessment Upper Extremity Assessment: Overall WFL for tasks assessed   Lower Extremity Assessment Lower Extremity Assessment: Defer to PT evaluation   Cervical / Trunk Assessment Cervical / Trunk Assessment: Normal   Communication Communication Communication: No difficulties   Cognition Arousal/Alertness:  Awake/alert Behavior During Therapy: WFL for tasks assessed/performed Overall Cognitive Status: Within Functional Limits for tasks assessed                                       General Comments  VSS on 2L  O2    Exercises     Shoulder Instructions      Home Living Family/patient expects to be discharged to:: Private residence Living Arrangements: Alone Available Help at Discharge: Family;Available PRN/intermittently Type of Home: Apartment Home Access: Stairs to enter Entrance Stairs-Number of Steps: 1   Home Layout: One level     Bathroom Shower/Tub: Teacher, early years/pre: Standard     Home Equipment: Shower seat   Additional Comments: does not wear O2 at home      Prior Functioning/Environment Prior Level of Function : Independent/Modified Independent;Needs assist;Driving               ADLs Comments: some assist for cleaning from niece, pt able to cook for herself        OT Problem List: Decreased strength;Decreased range of motion;Decreased activity tolerance;Cardiopulmonary status limiting activity      OT Treatment/Interventions: Self-care/ADL training;Therapeutic exercise;Energy conservation;DME and/or AE instruction;Therapeutic activities;Balance training;Patient/family education    OT Goals(Current goals can be found in the care plan section) Acute Rehab OT Goals Patient Stated Goal: none stated\ OT Goal Formulation: With patient Time For Goal Achievement: 05/09/22 Potential to Achieve Goals: Good ADL Goals Pt Will Perform Upper Body Dressing: Independently;sitting Pt Will Perform Lower Body Dressing: Independently;sitting/lateral leans;sit to/from stand Pt Will Transfer to Toilet: Independently;ambulating;regular height toilet Pt Will Perform Tub/Shower Transfer: Tub transfer;Shower transfer;Independently;shower seat Additional ADL Goal #1: pt will verbalize 3 energy conservation strategies in prep for ADLs  OT Frequency: Min 2X/week    Co-evaluation              AM-PAC OT "6 Clicks" Daily Activity     Outcome Measure Help from another person eating meals?: None Help from another person taking care of personal grooming?:  None Help from another person toileting, which includes using toliet, bedpan, or urinal?: None Help from another person bathing (including washing, rinsing, drying)?: A Little Help from another person to put on and taking off regular upper body clothing?: None Help from another person to put on and taking off regular lower body clothing?: A Little 6 Click Score: 22   End of Session Nurse Communication: Mobility status  Activity Tolerance: Patient tolerated treatment well Patient left: in bed;with call bell/phone within reach;with bed alarm set  OT Visit Diagnosis: Unsteadiness on feet (R26.81);Other abnormalities of gait and mobility (R26.89);Muscle weakness (generalized) (M62.81)                Time: 1610-9604 OT Time Calculation (min): 15 min Charges:  OT General Charges $OT Visit: 1 Visit OT Evaluation $OT Eval Low Complexity: 1 Low  Renaye Rakers, OTD, OTR/L SecureChat Preferred Acute Rehab [phone]   Renaye Rakers [place] 04/25/2022, 9:57 AM

## 2022-04-25 NOTE — Progress Notes (Cosign Needed)
    Durable Medical Equipment  (From admission, onward)           Start     Ordered   04/25/22 0720  For home use only DME oxygen  Once       Question Answer Comment  Length of Need Lifetime   Mode or (Route) Nasal cannula   Liters per Minute 2   Frequency Continuous (stationary and portable oxygen unit needed)   Oxygen conserving device Yes   Oxygen delivery system Gas      04/25/22 0719   04/25/22 0000  For home use only DME Nebulizer machine       Question Answer Comment  Patient needs a nebulizer to treat with the following condition COPD with chronic bronchitis   Length of Need Lifetime      04/25/22 1058

## 2022-04-25 NOTE — Discharge Summary (Signed)
Physician Discharge Summary  [name] EGB:[id number] DOB: 1945-07-15 DOA: 04/23/2022  PCP: Ailene Ards, NP  Admit date: 04/23/2022 Discharge date: 04/25/2022  Admitted From: Home Discharge disposition: Home  Recommendations at discharge:  Stop smoking Doxycycline for 5 days, tapering course of prednisone, bronchodilators, Mucinex  Brief narrative: Virginia Watson is a 76 y.o. female with PMH significant for COPD, smokes 1 pack/day, HTN 12/26, patient presented to ED at [facility] with complaint shortness of breath, productive cough worsening for 2 days.  No fever, chest pain.  No history of pulm embolism. In the ED, patient was afebrile, heart rate close to 100, blood pressure over 140 mostly, initially on room air, later required 2 L oxygen by nasal cannula Labs with unremarkable CBC.  BMP with creatinine elevated 1.24, troponin normal Respiratory virus panel negative for flu, RSV Chest x-ray did not show any acute cardiopulmonary disease. Patient was started on IV Rocephin, steroids Hospitalist service was consulted for admission and management. She waited for about 24 hours in the ED before getting an inpatient bed here at [facility] for transfer. At the time of my evaluation, patient was sitting up at the edge of the bed. On 2 L oxygen medical cannula.  Sister at bedside.  Subjective: Patient was seen and examined this morning.  Pleasant elderly African-American female.  Lying in bed.  Not in distress.  Feels better and ready to go home.  Was able to walk on the hallway yesterday, required supplemental oxygen.  Hospital course: Acute exacerbation of COPD Chronic daily smoker Presented with shortness of breath, cough for 2 days in the setting of 1 pack/day smoking for several years Chest x-ray without any acute infiltrates WBC count normal.  No fever.  Procalcitonin level not elevated. She was started on IV Rocephin in the ED.  I switched her to 5-day course of  oral doxycycline for anti-inflammatory purpose. Currently on prednisone 40 mg daily.  I will continue it to taper off in next few days Continue Dulera, DuoNeb and rescue albuterol inhaler Recent Labs  Lab 04/23/22 1715 04/25/22 0129  WBC 5.0 6.8  PROCALCITON  --  <0.10   Hypertension Continue metoprolol 25 mg twice daily and amlodipine 2.5 mg daily  CKD stage IIIb Creatinine seems to be at baseline. Recent Labs    01/02/22 1554 03/01/22 1353 04/23/22 2300 04/25/22 0129  BUN '19 16 17 19  '$ CREATININE 1.34* 1.27* 1.24* 1.45*   Lung nodules Screening CT scan of lung from September 2023 showed two small left lung nodules up to 3.3 mm. No suspicious lung nodules identified.   Wounds:  -    Discharge Exam:   Vitals:   04/24/22 2200 04/25/22 0451 04/25/22 0741 04/25/22 0841  BP:  125/85  125/88  Pulse:  76 77 (!) 101  Resp:  15 18   Temp:  98 F (36.7 C)  98.2 F (36.8 C)  TempSrc:    Oral  SpO2: 96% 95% 98% 94%  Weight:      Height:        Body mass index is 26.45 kg/m.   General exam: Pleasant, elderly African-American female.  Not in distress Skin: No rashes, lesions or ulcers. HEENT: Atraumatic, normocephalic, no obvious bleeding Lungs: Clear to auscultation bilaterally CVS: Regular rate and rhythm, no murmur GI/Abd soft, nontender, nondistended, bowel sound present CNS: Alert, awake, oriented x 3 Psychiatry: Mood appropriate Extremities: No pedal edema, no calf tenderness  Follow ups:    Follow-up Information  Ailene Ards, NP Follow up.   Specialty: Nurse Practitioner Contact information: [place] [address] [phone]                 Discharge Instructions:   Discharge Instructions     Call MD for:  difficulty breathing, headache or visual disturbances   Complete by: As directed    Call MD for:  extreme fatigue   Complete by: As directed    Call MD for:  hives   Complete by: As directed    Call MD for:   persistant dizziness or light-headedness   Complete by: As directed    Call MD for:  persistant nausea and vomiting   Complete by: As directed    Call MD for:  severe uncontrolled pain   Complete by: As directed    Call MD for:  temperature >100.4   Complete by: As directed    Diet general   Complete by: As directed    Discharge instructions   Complete by: As directed    Recommendations at discharge:   Stop smoking  Doxycycline for 5 days, tapering course of prednisone, bronchodilators, Mucinex  General discharge instructions: Follow with Primary MD Ailene Ards, NP in 7 days  Please request your PCP  to go over your hospital tests, procedures, radiology results at the follow up. Please get your medicines reviewed and adjusted.  Your PCP may decide to repeat certain labs or tests as needed. Do not drive, operate heavy machinery, perform activities at heights, swimming or participation in water activities or provide baby sitting services if your were admitted for syncope or siezures until you have seen by Primary MD or a Neurologist and advised to do so again. [place] Controlled Substance Reporting System database was reviewed. Do not drive, operate heavy machinery, perform activities at heights, swim, participate in water activities or provide baby-sitting services while on medications for pain, sleep and mood until your outpatient physician has reevaluated you and advised to do so again.  You are strongly recommended to comply with the dose, frequency and duration of prescribed medications. Activity: As tolerated with Full fall precautions use walker/cane & assistance as needed Avoid using any recreational substances like cigarette, tobacco, alcohol, or non-prescribed drug. If you experience worsening of your admission symptoms, develop shortness of breath, life threatening emergency, suicidal or homicidal thoughts you must seek medical attention immediately by calling 911 or  calling your MD immediately  if symptoms less severe. You must read complete instructions/literature along with all the possible adverse reactions/side effects for all the medicines you take and that have been prescribed to you. Take any new medicine only after you have completely understood and accepted all the possible adverse reactions/side effects.  Wear Seat belts while driving. You were cared for by a hospitalist during your hospital stay. If you have any questions about your discharge medications or the care you received while you were in the hospital after you are discharged, you can call the unit and ask to speak with the hospitalist or the covering physician. Once you are discharged, your primary care physician will handle any further medical issues. Please note that NO REFILLS for any discharge medications will be authorized once you are discharged, as it is imperative that you return to your primary care physician (or establish a relationship with a primary care physician if you do not have one).   For home use only DME Nebulizer machine   Complete by:  As directed    Patient needs a nebulizer to treat with the following condition: COPD with chronic bronchitis   Length of Need: Lifetime   Increase activity slowly   Complete by: As directed        Discharge Medications:   Allergies as of 04/25/2022   No Known Allergies      Medication List     TAKE these medications    albuterol 108 (90 Base) MCG/ACT inhaler Commonly known as: VENTOLIN HFA Inhale 2 puffs into the lungs every 6 (six) hours as needed for wheezing or shortness of breath.   amLODipine 2.5 MG tablet Commonly known as: NORVASC Take 2.5 mg by mouth daily.   cholecalciferol 25 MCG (1000 UNIT) tablet Commonly known as: VITAMIN D3 Take 1,000 Units by mouth daily.   dicyclomine 20 MG tablet Commonly known as: BENTYL Take 1 tablet (20 mg total) by mouth every 6 (six) hours as needed for spasms.   docusate  sodium 50 MG capsule Commonly known as: COLACE Take 50 mg by mouth 2 (two) times daily as needed for mild constipation.   doxycycline 100 MG tablet Commonly known as: VIBRA-TABS Take 1 tablet (100 mg total) by mouth every 12 (twelve) hours for 5 days.   GERITOL COMPLETE PO Take 1 tablet by mouth daily.   guaiFENesin-dextromethorphan 100-10 MG/5ML syrup Commonly known as: ROBITUSSIN DM Take 5 mLs by mouth every 4 (four) hours as needed for cough.   ipratropium-albuterol 0.5-2.5 (3) MG/3ML Soln Commonly known as: DUONEB Take 3 mLs by nebulization every 6 (six) hours as needed.   megestrol 40 MG/ML suspension Commonly known as: MEGACE Take 10 mLs (400 mg total) by mouth daily.   metoprolol tartrate 25 MG tablet Commonly known as: LOPRESSOR TAKE 1 TABLET(25 MG) BY MOUTH TWICE DAILY   mometasone-formoterol 200-5 MCG/ACT Aero Commonly known as: DULERA Inhale 2 puffs into the lungs 2 (two) times daily.   pantoprazole 40 MG tablet Commonly known as: PROTONIX Take 1 tablet (40 mg total) by mouth daily.   predniSONE 10 MG tablet Commonly known as: DELTASONE Take 4 tablets daily X 2 days, then, Take 3 tablets daily X 2 days, then, Take 2 tablets daily X 2 days, then, Take 1 tablets daily X 1 day.               Durable Medical Equipment  (From admission, onward)           Start     Ordered   04/25/22 0720  For home use only DME oxygen  Once       Question Answer Comment  Length of Need Lifetime   Mode or (Route) Nasal cannula   Liters per Minute 2   Frequency Continuous (stationary and portable oxygen unit needed)   Oxygen conserving device Yes   Oxygen delivery system Gas      04/25/22 0719   04/25/22 0000  For home use only DME Nebulizer machine       Question Answer Comment  Patient needs a nebulizer to treat with the following condition COPD with chronic bronchitis   Length of Need Lifetime      04/25/22 1058             The results of  significant diagnostics from this hospitalization (including imaging, microbiology, ancillary and laboratory) are listed below for reference.    Procedures and Diagnostic Studies:   DG Chest 2 View  Result Date: 04/23/2022 CLINICAL DATA:  Shortness of breath and  cough for 2 days EXAM: CHEST - 2 VIEW COMPARISON:  01/10/2022 FINDINGS: Cardiac shadow is within normal limits. Aortic calcifications are noted. Lungs are clear bilaterally. No bony abnormality is seen. IMPRESSION: No active cardiopulmonary disease. Electronically Signed   By: Inez Catalina M.D.   On: 04/23/2022 17:34     Labs:   Basic Metabolic Panel: Recent Labs  Lab 04/23/22 2300 04/25/22 0129  NA 140 138  K 4.0 4.0  CL 107 108  CO2 22 23  GLUCOSE 87 125*  BUN 17 19  CREATININE 1.24* 1.45*  CALCIUM 10.9* 10.2   GFR Estimated Creatinine Clearance: 33.9 mL/min (A) (by C-G formula based on SCr of 1.45 mg/dL (H)). Liver Function Tests: No results for input(s): "AST", "ALT", "ALKPHOS", "BILITOT", "PROT", "ALBUMIN" in the last 168 hours. No results for input(s): "LIPASE", "AMYLASE" in the last 168 hours. No results for input(s): "AMMONIA" in the last 168 hours. Coagulation profile No results for input(s): "INR", "PROTIME" in the last 168 hours.  CBC: Recent Labs  Lab 04/23/22 1715 04/25/22 0129  WBC 5.0 6.8  HGB 12.4 10.9*  HCT 36.0 31.8*  MCV 89.3 88.8  PLT 179 169   Cardiac Enzymes: No results for input(s): "CKTOTAL", "CKMB", "CKMBINDEX", "TROPONINI" in the last 168 hours. BNP: Invalid input(s): "POCBNP" CBG: No results for input(s): "GLUCAP" in the last 168 hours. D-Dimer No results for input(s): "DDIMER" in the last 72 hours. Hgb A1c No results for input(s): "HGBA1C" in the last 72 hours. Lipid Profile No results for input(s): "CHOL", "HDL", "LDLCALC", "TRIG", "CHOLHDL", "LDLDIRECT" in the last 72 hours. Thyroid function studies No results for input(s): "TSH", "T4TOTAL", "T3FREE", "THYROIDAB" in the  last 72 hours.  Invalid input(s): "FREET3" Anemia work up No results for input(s): "VITAMINB12", "FOLATE", "FERRITIN", "TIBC", "IRON", "RETICCTPCT" in the last 72 hours. Microbiology Recent Results (from the past 240 hour(s))  Resp panel by RT-PCR (RSV, Flu A&B, Covid) Anterior Nasal Swab     Status: None   Collection Time: 04/23/22  5:15 PM   Specimen: Anterior Nasal Swab  Result Value Ref Range Status   SARS Coronavirus 2 by RT PCR NEGATIVE NEGATIVE Final    Comment: (NOTE) SARS-CoV-2 target nucleic acids are NOT DETECTED.  The SARS-CoV-2 RNA is generally detectable in upper respiratory specimens during the acute phase of infection. The lowest concentration of SARS-CoV-2 viral copies this assay can detect is 138 copies/mL. A negative result does not preclude SARS-Cov-2 infection and should not be used as the sole basis for treatment or other patient management decisions. A negative result may occur with  improper specimen collection/handling, submission of specimen other than nasopharyngeal swab, presence of viral mutation(s) within the areas targeted by this assay, and inadequate number of viral copies(<138 copies/mL). A negative result must be combined with clinical observations, patient history, and epidemiological information. The expected result is Negative.  Fact Sheet for Patients:  EntrepreneurPulse.com.au  Fact Sheet for Healthcare Providers:  IncredibleEmployment.be  This test is no t yet approved or cleared by the [place] FDA and  has been authorized for detection and/or diagnosis of SARS-CoV-2 by FDA under an Emergency Use Authorization (EUA). This EUA will remain  in effect (meaning this test can be used) for the duration of the COVID-19 declaration under Section 564(b)(1) of the Act, 21 U.S.C.section 360bbb-3(b)(1), unless the authorization is terminated  or revoked sooner.       Influenza A by PCR NEGATIVE NEGATIVE  Final   Influenza B by PCR NEGATIVE NEGATIVE  Final    Comment: (NOTE) The Xpert Xpress SARS-CoV-2/FLU/RSV plus assay is intended as an aid in the diagnosis of influenza from Nasopharyngeal swab specimens and should not be used as a sole basis for treatment. Nasal washings and aspirates are unacceptable for Xpert Xpress SARS-CoV-2/FLU/RSV testing.  Fact Sheet for Patients: EntrepreneurPulse.com.au  Fact Sheet for Healthcare Providers: IncredibleEmployment.be  This test is not yet approved or cleared by the [place] FDA and has been authorized for detection and/or diagnosis of SARS-CoV-2 by FDA under an Emergency Use Authorization (EUA). This EUA will remain in effect (meaning this test can be used) for the duration of the COVID-19 declaration under Section 564(b)(1) of the Act, 21 U.S.C. section 360bbb-3(b)(1), unless the authorization is terminated or revoked.     Resp Syncytial Virus by PCR NEGATIVE NEGATIVE Final    Comment: (NOTE) Fact Sheet for Patients: EntrepreneurPulse.com.au  Fact Sheet for Healthcare Providers: IncredibleEmployment.be  This test is not yet approved or cleared by the [place] FDA and has been authorized for detection and/or diagnosis of SARS-CoV-2 by FDA under an Emergency Use Authorization (EUA). This EUA will remain in effect (meaning this test can be used) for the duration of the COVID-19 declaration under Section 564(b)(1) of the Act, 21 U.S.C. section 360bbb-3(b)(1), unless the authorization is terminated or revoked.  Performed at [facility], [address], [place], [place] [address]     Time coordinating discharge: 35 minutes  Signed: Marlowe Aschoff Enrrique Mierzwa  [facility] 04/25/2022, 10:58 AM

## 2022-04-25 NOTE — Progress Notes (Signed)
Discharge instructions given, patient and visitor verbalized an understanding of all dc instructions.  Patient left unit via wheelchair on O2 in no distress

## 2022-04-26 ENCOUNTER — Telehealth: Payer: Self-pay | Admitting: Nurse Practitioner

## 2022-04-26 ENCOUNTER — Telehealth: Payer: Self-pay

## 2022-04-26 NOTE — Telephone Encounter (Signed)
Transition Care Management Follow-up Telephone Call Date of discharge and from where: [facility] 04/25/2022 How have you been since you were released from the hospital? Better still has cough Any questions or concerns? No  Items Reviewed: Did the pt receive and understand the discharge instructions provided? Yes  Medications obtained and verified? Yes  Other? No  Any new allergies since your discharge? No  Dietary orders reviewed? Yes Do you have support at home? Yes   Home Care and Equipment/Supplies: Were home health services ordered? no If so, what is the name of the agency? N/a  Has the agency set up a time to come to the patient's home? not applicable Were any new equipment or medical supplies ordered?  Yes: nebulizer What is the name of the medical supply agency? unknown Were you able to get the supplies/equipment? no Do you have any questions related to the use of the equipment or supplies? No  Functional Questionnaire: (I = Independent and D = Dependent) ADLs: I  Bathing/Dressing- I  Meal Prep- I  Eating- I  Maintaining continence- I  Transferring/Ambulation- I  Managing Meds- I  Follow up appointments reviewed:  PCP Hospital f/u appt confirmed? Yes  Scheduled to see Dr Pearline Cables on 05/03/2022 @ 8:40. [facility] f/u appt confirmed? No  Are transportation arrangements needed? No  If their condition worsens, is the pt aware to call PCP or go to the Emergency Dept.? Yes Was the patient provided with contact information for the PCP's office or ED? Yes Was to pt encouraged to call back with questions or concerns? Yes Juanda Crumble, LPN [place] Direct Dial [phone]

## 2022-04-26 NOTE — Telephone Encounter (Signed)
Transition Care Management Unsuccessful Follow-up Telephone Call  Date of discharge and from where:  [facility] 04/25/2022  Attempts:  1st Attempt  Reason for unsuccessful TCM follow-up call:  Left voice message Mansour Balboa, LPN [facility] Nurse Health Advisor Direct Dial [phone]     

## 2022-04-26 NOTE — Telephone Encounter (Signed)
Patient received her oxygen yesterday but did not receive her nebulizer - an order will have to be placed for this.  Please send order to [facility] - Please fax to [phone]

## 2022-04-29 DIAGNOSIS — R011 Cardiac murmur, unspecified: Secondary | ICD-10-CM

## 2022-04-29 HISTORY — DX: Cardiac murmur, unspecified: R01.1

## 2022-05-02 ENCOUNTER — Other Ambulatory Visit: Payer: Self-pay | Admitting: Nurse Practitioner

## 2022-05-02 DIAGNOSIS — J449 Chronic obstructive pulmonary disease, unspecified: Secondary | ICD-10-CM

## 2022-05-03 ENCOUNTER — Ambulatory Visit (INDEPENDENT_AMBULATORY_CARE_PROVIDER_SITE_OTHER): Payer: Medicare Other | Admitting: Nurse Practitioner

## 2022-05-03 ENCOUNTER — Telehealth: Payer: Self-pay | Admitting: Nurse Practitioner

## 2022-05-03 VITALS — BP 100/78 | HR 87 | Temp 97.6°F | Wt 157.0 lb

## 2022-05-03 DIAGNOSIS — R079 Chest pain, unspecified: Secondary | ICD-10-CM | POA: Diagnosis not present

## 2022-05-03 DIAGNOSIS — J449 Chronic obstructive pulmonary disease, unspecified: Secondary | ICD-10-CM

## 2022-05-03 DIAGNOSIS — R072 Precordial pain: Secondary | ICD-10-CM | POA: Insufficient documentation

## 2022-05-03 LAB — TROPONIN I (HIGH SENSITIVITY): High Sens Troponin I: 5 ng/L (ref 2–17)

## 2022-05-03 MED ORDER — TRELEGY ELLIPTA 100-62.5-25 MCG/ACT IN AEPB: 1.0000 | INHALATION_SPRAY | Freq: Every day | RESPIRATORY_TRACT | 6 refills | Status: DC

## 2022-05-03 NOTE — Telephone Encounter (Signed)
Paper order faxed, respond oK

## 2022-05-03 NOTE — Progress Notes (Signed)
Established Patient Office Visit  Subjective   Patient ID: Virginia Watson, female    DOB: January 02, 1946  Age: 77 y.o. MRN: [id number]  Chief Complaint  Patient presents with   COPD   Patient hospitalized for COPD exacerbation 12/26 - 12/28.  She is here today for follow-up regarding this.  Today she arrives with her portable oxygen tank which is newly prescribed to her.  She is on 2 L nasal cannula continuously.  On day of admission she presented with complaints of shortness of breath and productive cough x 2 days.  She did have hypoxia while in the emergency department and was ultimately admitted for COPD exacerbation.  Testing was negative for flu RSV, and chest x-ray did not show any acute cardiopulmonary disease.  She was treated with IV antibiotics and steroids.  She was discharged on 5-day course of doxycycline, prednisone taper, and told to continue her maintenance Dulera inhaler and rescue albuterol inhaler.  She was also prescribed DuoNeb for rescue as needed with a nebulizer machine.  Unfortunately for some reason nebulizer machine prescription was either not received by home health agency or was not excepted by them.  Patient reports she still has not been able to get her nebulizer machine.  A prescription written by myself for nebulizer was faxed to the home health agency this morning.  Today, she reports that she is feeling a bit better and less short of breath.  She does still feel fatigued.  She reports this was her only exacerbation over the last calendar year.  She does not follow with pulmonology regularly, per chart review I do see that she saw Dr. Shearon Stalls in January 2022, was recommended to follow-up in 3 months after that visit.  This follow-up never occurred.  Patient does not recall this visit.  She is up-to-date with pneumonia and flu vaccines, has had COVID vaccinations and would be a good candidate for RSV vaccination.  She reports that she has taken a few breaks at home  from her continuous oxygen due to discomfort with dry mouth, she does have an at home pulse oximeter and monitors her oxygen saturations.  Reports that she is compliant with using oxygen overnight.  She does not mention this on her own but during review of systems she does admit to having experienced an episode of epigastric chest pain last evening.  She reports having taken aspirin which relieved the pain and the pain has not returned.  She does have history of gastritis and I believe was treated for H. pylori in the past.  Does report that this is not her first episode of chest pain, but she has not ever had evaluation by cardiology for chest pain. Per chart review no history of coronary artery disease or personal history of MI.  Patient is a smoker.  She is not on statin therapy and last LDL level was 113 however this was collected back in 2020.    Review of Systems  Respiratory:  Positive for shortness of breath (better than when she was hospitalized).   Cardiovascular:  Positive for chest pain.      Objective:     BP 100/78   Pulse 87   Temp 97.6 F (36.4 C) (Temporal)   Wt 157 lb (71.2 kg)   SpO2 97% Comment: oxygen  BMI 26.13 kg/m    Physical Exam Vitals reviewed.  Constitutional:      General: She is not in acute distress.    Appearance: Normal appearance.  HENT:     Head: Normocephalic and atraumatic.  Neck:     Vascular: No carotid bruit.  Cardiovascular:     Rate and Rhythm: Normal rate and regular rhythm.     Pulses: Normal pulses.     Heart sounds: Normal heart sounds.  Pulmonary:     Effort: Pulmonary effort is normal.     Breath sounds: Normal breath sounds.     Comments: On portable oxygen tank 2LPM Skin:    General: Skin is warm and dry.  Neurological:     General: No focal deficit present.     Mental Status: She is alert and oriented to person, place, and time.  Psychiatric:        Mood and Affect: Mood normal.        Behavior: Behavior normal.         Judgment: Judgment normal.      No results found for any visits on 05/03/22.    The ASCVD Risk score (Arnett DK, et al., 2019) failed to calculate for the following reasons:   Cannot find a previous HDL lab   Cannot find a previous total cholesterol lab    Assessment & Plan:   Problem List Items Addressed This Visit       Respiratory   COPD (chronic obstructive pulmonary disease) ([place]) - Primary    Chronic, will discontinue Dulera and initiate her on Trelegy 1 puff into the lungs daily.  Patient will continue albuterol inhaler as needed, DuoNeb nebulizer as needed as well.  Will refer patient back to Dr. Shearon Stalls to establish care to assist with managing her COPD.  Patient will continue on continuous oxygen at 2 L/min.  She was told that if she needs to take a break during the day to monitor her oxygen saturations and if it were to fall below 90% to put oxygen back on.  She reports understanding.  Patient encouraged to consider RSV vaccination when she goes to her pharmacy later today.  Patient reports understanding.      Relevant Medications   Fluticasone-Umeclidin-Vilant (TRELEGY ELLIPTA) 100-62.5-25 MCG/ACT AEPB   Other Relevant Orders   Ambulatory referral to Pulmonology     Other   Chest pain    Acute, resolved today but did occur last night.  She has multiple risk factors for coronary artery disease.  EKG completed today which showed normal sinus rhythm no ST segment abnormalities noted.  Will get stat troponin, if elevated patient will be called and told to proceed to the emergency department.  Otherwise patient will be referred to cardiology on outpatient basis for evaluation.      Relevant Orders   EKG 12-Lead   Troponin I (High Sensitivity)   EKG 12-Lead   Ambulatory referral to Cardiology    Return for Follow-up as scheduled or sooner as needed.Marland Kitchen    Ailene Ards, NP

## 2022-05-03 NOTE — Assessment & Plan Note (Signed)
Acute, resolved today but did occur last night.  She has multiple risk factors for coronary artery disease.  EKG completed today which showed normal sinus rhythm no ST segment abnormalities noted.  Will get stat troponin, if elevated patient will be called and told to proceed to the emergency department.  Otherwise patient will be referred to cardiology on outpatient basis for evaluation.

## 2022-05-03 NOTE — Telephone Encounter (Signed)
Virginia Watson with [facility] calls today in regards to recent order for a nebulizer. Virginia Watson states that she received the order today but, for insurance reasons, they are requiring the office visit notes stating that a nebulizer is needed. These notes can be faxed out at provided number.  FAX: [phone]

## 2022-05-03 NOTE — Patient Instructions (Addendum)
RSV vaccine - ask your pharmacist about this  Virginia Watson - Glass blower/designer at [facility] [phone]

## 2022-05-03 NOTE — Assessment & Plan Note (Signed)
Chronic, will discontinue Dulera and initiate her on Trelegy 1 puff into the lungs daily.  Patient will continue albuterol inhaler as needed, DuoNeb nebulizer as needed as well.  Will refer patient back to Dr. Shearon Stalls to establish care to assist with managing her COPD.  Patient will continue on continuous oxygen at 2 L/min.  She was told that if she needs to take a break during the day to monitor her oxygen saturations and if it were to fall below 90% to put oxygen back on.  She reports understanding.  Patient encouraged to consider RSV vaccination when she goes to her pharmacy later today.  Patient reports understanding.

## 2022-05-03 NOTE — Telephone Encounter (Signed)
Office note printed and faxed

## 2022-05-07 ENCOUNTER — Telehealth: Payer: Self-pay | Admitting: Internal Medicine

## 2022-05-07 NOTE — Telephone Encounter (Signed)
Received referral from Jeralyn Ruths (PCP). She would like pt to have a follow-up for emphysema. LMTCB to schedule to see App

## 2022-05-07 NOTE — Telephone Encounter (Signed)
Pt has called for a status update. I let her know about the pw being sent to [facility] and let her know that we have sent the orders again on the 4th.

## 2022-05-09 ENCOUNTER — Encounter: Payer: Medicare Other | Admitting: Physical Medicine & Rehabilitation

## 2022-05-17 ENCOUNTER — Ambulatory Visit: Payer: Medicare Other | Admitting: Physical Medicine & Rehabilitation

## 2022-05-21 ENCOUNTER — Ambulatory Visit: Payer: 59 | Attending: Cardiology | Admitting: Cardiology

## 2022-05-21 ENCOUNTER — Encounter: Payer: Self-pay | Admitting: Cardiology

## 2022-05-21 VITALS — BP 122/80 | HR 79 | Ht 66.5 in | Wt 161.0 lb

## 2022-05-21 DIAGNOSIS — N1832 Chronic kidney disease, stage 3b: Secondary | ICD-10-CM

## 2022-05-21 DIAGNOSIS — R011 Cardiac murmur, unspecified: Secondary | ICD-10-CM

## 2022-05-21 DIAGNOSIS — F17210 Nicotine dependence, cigarettes, uncomplicated: Secondary | ICD-10-CM

## 2022-05-21 DIAGNOSIS — R072 Precordial pain: Secondary | ICD-10-CM

## 2022-05-21 DIAGNOSIS — I10 Essential (primary) hypertension: Secondary | ICD-10-CM

## 2022-05-21 NOTE — Patient Instructions (Addendum)
Medication Instructions:   No changes *If you need a refill on your cardiac medications before your next appointment, please call your pharmacy*   Lab Work: Lipid CMP hgbA1c If you have labs (blood work) drawn today and your tests are completely normal, you will receive your results only by: [place] (if you have MyChart) OR A paper copy in the mail If you have any lab test that is abnormal or we need to change your treatment, we will call you to review the results.   Testing/Procedures: Will be schedule  at [facility] has requested that you have an echocardiogram. Echocardiography is a painless test that uses sound waves to create images of your heart. It provides your doctor with information about the size and shape of your heart and how well your heart's chambers and valves are working. This procedure takes approximately one hour. There are no restrictions for this procedure. Please do NOT wear cologne, perfume, aftershave, or lotions (deodorant is allowed). Please arrive 15 minutes prior to your appointment time.    Follow-Up: At [facility], you and your health needs are our priority.  As part of our continuing mission to provide you with exceptional heart care, we have created designated Provider Care Teams.  These Care Teams include your primary Cardiologist (physician) and Advanced Practice Providers (APPs -  Physician Assistants and Nurse Practitioners) who all work together to provide you with the care you need, when you need it.     Your next appointment:   4 month(s)  The format for your next appointment:   In Person  Provider:   Glenetta Hew, MD    Other Instructions

## 2022-05-21 NOTE — Progress Notes (Addendum)
Primary Care Provider: Ailene Ards, NP [place] Cardiologist: Glenetta Hew, MD Electrophysiologist: None  Clinic Note: No chief complaint on file.  ===================================  ASSESSMENT/PLAN   Problem List Items Addressed This Visit       Cardiology Problems   Essential hypertension (Chronic)    Blood pressure is well-controlled on current dose of beta-blocker and amlodipine.  Both are incidentally appropriate medications for possible angina.        Other   Systolic ejection murmur    Systolic Ejection Murmur Per patient report, has been present for some time but never evaluated. Suspicious for aortic stenosis based on murmur characteristics  - Echocardiogram  Murmur sounds consistent with at least aortic sclerosis but need to exclude aortic stenosis.  Will check 2D echo to evaluate aortic valve but also for cardiac function with diagnosis of epigastric/precordial pain.      Relevant Orders   ECHOCARDIOGRAM COMPLETE   Stage 3b chronic kidney disease ([place])   Relevant Orders   Hemoglobin A1c (Completed)   Precordial pain - Primary    Pain more consistent with Epigastric Pain Suspect gastric in origin given characteristics and relief with passing gas. Non-exertional. Troponin negative. Known history of gastritis and Schatzi ring of the distal esophagus. Does have cardiac risk factors though. Patient with history of hypertension well-controlled on very low dose amlodipine. Not on statin. Last LDL 113, but that was in 2020. Patient is a smoker.  - Lipid profile, A1c, chemistry for cardiac risk stratification - Echocardiogram as below - Do not see an indication for ischemic evaluation at this time if echo is normal - Smoking cessation. Down to 3-4 cigarettes/day--plans to pursue cessation with the assistance of her pulmonologist   Pain is in the lower end of the precordium but still somewhat concerning based on her sister's history of heart  disease. Not overly convinced that this is truly cardiac related pain.  Will check a 2D echo because the murmur and evaluate cardiac risk factors with A1c and lipid panel.  If there is any wall motion normalities or other abnormalities on the echocardiogram that would suggest the possibility of ischemic CAD, we can consider Coronary CTA for baseline assessment and precordial pain evaluation.  For now we will hold off. Continue beta-blocker and amlodipine. Smoking cessation counseling      Relevant Orders   Lipid panel (Completed)   Comprehensive metabolic panel (Completed)   Hemoglobin A1c (Completed)   ECHOCARDIOGRAM COMPLETE   Cigarette nicotine dependence without complication    Spoke to patient about smoking cessation counseling. Smoking cessation instruction/counseling given:  counseled patient on the dangers of tobacco use, advised patient to stop smoking, and reviewed strategies to maximize success       Relevant Orders   Lipid panel (Completed)   Comprehensive metabolic panel (Completed)   Hemoglobin A1c (Completed)    ===================================  HPI:    Virginia Watson is a 77 y.o. female with a PMH significant for COPD (long-term smoker, 1 PPD), and HTN who presents today for chest pain referred by PCP Pearline Cables, Rande Brunt, NP) .  Recent Hospitalizations:  12/26-28/2023 admitted via [facility] ER with complaints of dyspnea productive cough for 2 days.  No fever.  Afebrile, was tachycardic but normotensive.  After starting on room air was placed on 2 L oxygen. => Treated empirically with Rocephin and steroids.  She actually arrived at [facility] a full 24 hours after ER presentation.  At that time she was  feeling better was able to walk in the hallway without dyspnea but was still requiring supplemental oxygen.  White count was normal.  Prolactin level is normal.  She was switched to a 5-day course of oral doxycycline with prednisone taper.  Was  continued on 25 mg Lopressor twice daily and amlodipine 2.5 mg daily.  No complaints of chest pain.  She was seen by Jeralyn Ruths, NP => she was feeling better.  Less dyspneic but still somewhat fatigued.  Had not been seen by pulmonary medicine yet.  Recommendation was for her to be seen in Follow-up in March 2023, but this did not happen.  Following her discharge she had only missed being on oxygen for a little while.  On review of symptoms, was noted to have epigastric pain the previous evening.  This was relieved with aspirin.  Epigastric pain was then considered to be chest pain and referred to cardiology..  Reviewed  CV studies:    The following studies were reviewed today: (if available, images/films reviewed: From Epic Chart or Care Everywhere) None  Interval History:   Virginia Watson presents here today at the request of her PCP for "chest pain". Had three episodes of episodes of epigastric pain at rest relieved with Aspirin. Non-exertional, not relieved by rest. Worse when lying down. Is relieved when she passes gas. No pain episodes since 05/02/22. She now thinks this was due to gas. PCP checked troponin which was normal.  Does have SOB but this is thought to be 2/2 COPD.  Newly on 2L baseline O2 for COPD. No known cardiac disease. Sister had heart disease.   CV Review of Symptoms (Summary):: negative  In discussion, she really points to the truly epigastric area-below the costal chondral border as a site of her discomfort.  She never pointed up into her chest.  These episodes did not occur with exertion.  They were not made worse with exertion.  They are worse with lying down and relieved with flatulence.  She has not had any further symptoms since early January.  She has baseline dyspnea with COPD but has no PND or apnea.  No rapid irregular heartbeats palpitations.  No syncope or near syncope.  No TIA or amaurosis fugax.  No claudication. She is not all that active.  REVIEWED  OF SYSTEMS   Review of Systems  Constitutional:  Positive for malaise/fatigue. Negative for weight loss.  HENT:  Negative for congestion.   Respiratory:  Positive for cough and shortness of breath. Negative for sputum production.   Gastrointestinal:  Negative for blood in stool, constipation and melena.       Epigastric discomfort no longer present.  But she still has intermittent flatulence and belching  Genitourinary:  Negative for dysuria, frequency and hematuria.  Musculoskeletal:  Positive for joint pain (Mild aches and pains).  Neurological:  Negative for dizziness and focal weakness.  Psychiatric/Behavioral:  Negative for memory loss. The patient is not nervous/anxious.     I have reviewed and (if needed) personally updated the patient's problem list, medications, allergies, past medical and surgical history, social and family history.   PAST MEDICAL HISTORY   Past Medical History:  Diagnosis Date   Allergy    Bone spur 04/2021   spine   COPD (chronic obstructive pulmonary disease) ([place])    per pt told it was seen on an xray from an ER visit, not being treated   Heart murmur    Hypertension    Sciatica 04/2021  Scoliosis 04/2021    PAST SURGICAL HISTORY   Past Surgical History:  Procedure Laterality Date   UPPER GASTROINTESTINAL ENDOSCOPY      Immunization History  Administered Date(s) Administered   Fluad Quad(high Dose 65+) 03/21/2020, 03/01/2022   Influenza Split 03/01/2015   PFIZER(Purple Top)SARS-COV-2 Vaccination 07/04/2019, 08/11/2019   PNEUMOCOCCAL CONJUGATE-20 03/01/2022   Pneumococcal Polysaccharide-23 10/14/2017    MEDICATIONS/ALLERGIES   Current Meds  Medication Sig   albuterol (VENTOLIN HFA) 108 (90 Base) MCG/ACT inhaler Inhale 2 puffs into the lungs every 6 (six) hours as needed for wheezing or shortness of breath.   amLODipine (NORVASC) 2.5 MG tablet Take 2.5 mg by mouth daily.   cholecalciferol (VITAMIN D3) 25 MCG (1000 UNIT) tablet Take  1,000 Units by mouth daily.   dicyclomine (BENTYL) 20 MG tablet Take 1 tablet (20 mg total) by mouth every 6 (six) hours as needed for spasms.   docusate sodium (COLACE) 50 MG capsule Take 50 mg by mouth 2 (two) times daily as needed for mild constipation.   Fluticasone-Umeclidin-Vilant (TRELEGY ELLIPTA) 100-62.5-25 MCG/ACT AEPB Inhale 1 puff into the lungs daily.   guaiFENesin-dextromethorphan (ROBITUSSIN DM) 100-10 MG/5ML syrup Take 5 mLs by mouth every 4 (four) hours as needed for cough.   ipratropium-albuterol (DUONEB) 0.5-2.5 (3) MG/3ML SOLN Take 3 mLs by nebulization every 6 (six) hours as needed.   Iron-Vitamins (GERITOL COMPLETE PO) Take 1 tablet by mouth daily.   megestrol (MEGACE) 40 MG/ML suspension Take 10 mLs (400 mg total) by mouth daily.   metoprolol tartrate (LOPRESSOR) 25 MG tablet TAKE 1 TABLET(25 MG) BY MOUTH TWICE DAILY    No Known Allergies  SOCIAL HISTORY/FAMILY HISTORY   Reviewed in Epic:  Pertinent findings:  Social History   Tobacco Use   Smoking status: Every Day    Packs/day: 0.25    Years: 50.00    Total pack years: 12.50    Types: Cigarettes   Smokeless tobacco: Never   Tobacco comments:    Currently smoking 5 cigarettes a day    1-800-QUIT-NOW (1-703-146-1071).    http://bell-fernandez.com/                1-800-QUIT-NOW 215-637-8326).    http://bell-fernandez.com/      Vaping Use   Vaping Use: Never used  Substance Use Topics   Alcohol use: Yes    Comment: on holidays   Drug use: No   Social History   Social History Narrative   Not on file    OBJCTIVE -PE, EKG, labs   Wt Readings from Last 3 Encounters:  05/24/22 159 lb (72.1 kg)  05/21/22 161 lb (73 kg)  05/03/22 157 lb (71.2 kg)    Physical Exam: BP 122/80   Pulse 79   Ht 5' 6.5" (1.689 m)   Wt 161 lb (73 kg)   SpO2 97%   BMI 25.60 kg/m  Physical Exam Vitals reviewed.  Constitutional:      General: She is not in acute distress. HENT:     Mouth/Throat:      Mouth: Mucous membranes are moist.  Cardiovascular:     Rate and Rhythm: Normal rate and regular rhythm.     Comments: III/VI crescendo-decrescendo systolic ejection murmur. Does not radiate to the neck.  Pulmonary:     Comments: Effort normal on 2L O2; poor air movement but without wheezes, rales, or rhonchi Musculoskeletal:     Cervical back: Neck supple.     Right lower leg: No edema.     Left  lower leg: No edema.  Lymphadenopathy:     Cervical: No cervical adenopathy.  Skin:    General: Skin is warm and dry.     Capillary Refill: Capillary refill takes less than 2 seconds.  Neurological:     Mental Status: She is alert.    On exam, she appears older than stated age.  Somewhat frail.  She does have a relatively prominent systolic ejection murmur that I reviewed with the resident physician.  Appropriately documented.  No rales or rhonchi.  Abdomen does have some tenderness to palpation in the left upper quadrant and epigastric area.  Somewhat protuberant but not necessarily distended.  Not overly tympanitic  Recent Labs:    Lab Results  Component Value Date   CHOL 159 05/21/2022   HDL 36 (L) 05/21/2022   LDLCALC 103 (H) 05/21/2022   TRIG 108 05/21/2022   CHOLHDL 4.4 05/21/2022   Lab Results  Component Value Date   CREATININE 1.24 (H) 05/21/2022   BUN 17 05/21/2022   NA 141 05/21/2022   K 4.9 05/21/2022   CL 105 05/21/2022   CO2 21 05/21/2022      Latest Ref Rng & Units 04/25/2022    1:29 AM 04/23/2022    5:15 PM 03/01/2022    1:53 PM  CBC  WBC 4.0 - 10.5 K/uL 6.8  5.0  6.4   Hemoglobin 12.0 - 15.0 g/dL 10.9  12.4  12.5   Hematocrit 36.0 - 46.0 % 31.8  36.0  37.8   Platelets 150 - 400 K/uL 169  179  186.0     Lab Results  Component Value Date   HGBA1C 6.0 (H) 05/21/2022   Lab Results  Component Value Date   TSH 1.28 12/10/2019    ================================================== Total of 23 minutes with the patient spent in direct patient consultation -  Resident & Attending.  Additional time spent with chart review  / charting (studies, outside notes, etc): 9 mm Resident & 8 min Attending Total Time: 40  min  Current medicines are reviewed at length with the patient today.  (+/- concerns) n/a  Notice: This dictation was prepared with Dragon dictation along with smart phrase technology. Any transcriptional errors that result from this process are unintentional and may not be corrected upon review.  Studies Ordered:   Orders Placed This Encounter  Procedures   Lipid panel   Comprehensive metabolic panel   Hemoglobin A1c   ECHOCARDIOGRAM COMPLETE   No orders of the defined types were placed in this encounter.   Patient Instructions / Medication Changes & Studies & Tests Ordered   Patient Instructions  Medication Instructions:   No changes *If you need a refill on your cardiac medications before your next appointment, please call your pharmacy*   Lab Work: Lipid CMP hgbA1c If you have labs (blood work) drawn today and your tests are completely normal, you will receive your results only by: [place] (if you have MyChart) OR A paper copy in the mail If you have any lab test that is abnormal or we need to change your treatment, we will call you to review the results.   Testing/Procedures: Will be schedule  at [facility] has requested that you have an echocardiogram. Echocardiography is a painless test that uses sound waves to create images of your heart. It provides your doctor with information about the size and shape of your heart and how well your heart's chambers and valves are working.  This procedure takes approximately one hour. There are no restrictions for this procedure. Please do NOT wear cologne, perfume, aftershave, or lotions (deodorant is allowed). Please arrive 15 minutes prior to your appointment time.    Follow-Up: At [facility], you and your health needs are  our priority.  As part of our continuing mission to provide you with exceptional heart care, we have created designated Provider Care Teams.  These Care Teams include your primary Cardiologist (physician) and Advanced Practice Providers (APPs -  Physician Assistants and Nurse Practitioners) who all work together to provide you with the care you need, when you need it.     Your next appointment:   4 month(s)  The format for your next appointment:   In Person  Provider:   Glenetta Hew, MD    Other Instructions     Signed,  Dorene Grebe. Joelyn Oms, [place]  I have seen, examined and evaluated the patient along with the Resident Physician in clinic today.  I personally performed my own interview & exanimation.  After reviewing all the available data and chart, we discussed the patients laboratory, study & physical findings as well as symptoms in detail. I agree with his findings, examination as well as impression recommendations as per our discussion.    Attending adjustments int the full clinic noted annotated in [place].     Leonie Man, MD, MS Glenetta Hew, M.D., M.S. Interventional Cardiologist  [place]  Pager # [phone] Phone # [phone] [address] [place], [place] [address]   Thank you for choosing [facility] [place]!!

## 2022-05-22 LAB — COMPREHENSIVE METABOLIC PANEL
ALT: 18 IU/L (ref 0–32)
AST: 19 IU/L (ref 0–40)
Albumin/Globulin Ratio: 1.7 (ref 1.2–2.2)
Albumin: 4.4 g/dL (ref 3.8–4.8)
Alkaline Phosphatase: 78 IU/L (ref 44–121)
BUN/Creatinine Ratio: 14 (ref 12–28)
BUN: 17 mg/dL (ref 8–27)
Bilirubin Total: 0.4 mg/dL (ref 0.0–1.2)
CO2: 21 mmol/L (ref 20–29)
Calcium: 10.9 mg/dL — ABNORMAL HIGH (ref 8.7–10.3)
Chloride: 105 mmol/L (ref 96–106)
Creatinine, Ser: 1.24 mg/dL — ABNORMAL HIGH (ref 0.57–1.00)
Globulin, Total: 2.6 g/dL (ref 1.5–4.5)
Glucose: 78 mg/dL (ref 70–99)
Potassium: 4.9 mmol/L (ref 3.5–5.2)
Sodium: 141 mmol/L (ref 134–144)
Total Protein: 7 g/dL (ref 6.0–8.5)
eGFR: 46 mL/min/{1.73_m2} — ABNORMAL LOW (ref >59)

## 2022-05-22 LAB — HEMOGLOBIN A1C
Est. average glucose Bld gHb Est-mCnc: 126 mg/dL
Hgb A1c MFr Bld: 6 % — ABNORMAL HIGH (ref 4.8–5.6)

## 2022-05-22 LAB — LIPID PANEL
Chol/HDL Ratio: 4.4 ratio (ref 0.0–4.4)
Cholesterol, Total: 159 mg/dL (ref 100–199)
HDL: 36 mg/dL — ABNORMAL LOW (ref >39)
LDL Chol Calc (NIH): 103 mg/dL — ABNORMAL HIGH (ref 0–99)
Triglycerides: 108 mg/dL (ref 0–149)
VLDL Cholesterol Cal: 20 mg/dL (ref 5–40)

## 2022-05-24 ENCOUNTER — Ambulatory Visit (INDEPENDENT_AMBULATORY_CARE_PROVIDER_SITE_OTHER): Payer: 59 | Admitting: Primary Care

## 2022-05-24 ENCOUNTER — Encounter: Payer: Self-pay | Admitting: Primary Care

## 2022-05-24 ENCOUNTER — Institutional Professional Consult (permissible substitution): Payer: Medicare Other | Admitting: Pulmonary Disease

## 2022-05-24 VITALS — BP 118/72 | HR 79 | Ht 66.5 in | Wt 159.0 lb

## 2022-05-24 DIAGNOSIS — J9611 Chronic respiratory failure with hypoxia: Secondary | ICD-10-CM | POA: Diagnosis not present

## 2022-05-24 DIAGNOSIS — J449 Chronic obstructive pulmonary disease, unspecified: Secondary | ICD-10-CM | POA: Diagnosis not present

## 2022-05-24 DIAGNOSIS — J441 Chronic obstructive pulmonary disease with (acute) exacerbation: Secondary | ICD-10-CM

## 2022-05-24 NOTE — Patient Instructions (Addendum)
Recommendations: - Continue to use Trelegy Ellipta 100 mcg-take 1 puff daily in the morning - Use albuterol 2 puffs every 6 hours as needed for breakthrough shortness of breath or wheezing - Take Robitussin 5 mL every 4-6 hours as needed for cough/chest congestion - Use flutter valve 2-3 times a day to loosen chest congestion/cough  - You will be due for repeat CAT scan for lung cancer screening in September 2024 - Encourage you continue to taper amount you are smoking and pick quit date -can use nicotine gum or mint  Orders:  - Please give patient flutter valve  - Ambulatory walk on room air, titrate O2 if needed - Does not require supplemental oxygen with exertion we will check overnight oximetry test on room air to see if we can discontinue  Follow-up: - 6 months with Dr. Macarthur Critchley can receive free nicotine replacement therapy ( patches, gum or mints) by calling 1-800-QUIT NOW. Please call so we can get you on the path to becoming  a non-smoker. I know it is hard, but you can do this!  Hypnosis for smoking cessation  [employer]. [phone]  Acupuncture for smoking cessation  [facility] [phone]

## 2022-05-24 NOTE — Progress Notes (Signed)
$'@Patient'u$  ID: Virginia Watson, female    DOB: May 05, 1945, 77 y.o.   MRN: [id number]  Chief Complaint  Patient presents with   Follow-up    COPD  Breathing is good  O2 2l     Referring provider: Ailene Ards, NP  HPI: 77 year old female, current everyday smoker.  Past medical history significant for hypertension, COPD, chronic respiratory failure with hypoxia, stage IIIb chronic kidney disease, H. pylori infection, intentional weight loss.  05/24/2022- interim hx  Patient presents today for an overdue follow-up.  She was last seen 2 years ago by Dr. Shearon Stalls for consult.  She was hospitalized in December 2023 for acute exacerbation of COPD.  Chest x-ray without infiltrates.  WBC normal.  Prolactin level not elevated.  She was treated with IV Rocephin and 5-day course of doxycycline.  She had a CT of her lungs in September 2023 that showed 2 small left lung nodules measuring up to 3.3 mm.  No suspicious lung nodules were identified.    She is feeling better since being discharge. She completed doxycycline and prednisone taper. She has an occasional productive cough with clear sputum. She feels her breathing is pretty good. She is compliant with Trelegy. Uses albuterol inhaler typically once daily at bedtime. Wearing 2 L of oxygen 24/7. This is new since December. She will at times take off 02 when walking to the bathroom and will forget to put it back on. States that her levels will be in high 90s. She is a current smoker, recently cut down to 4 cigarettes a day.  She is interested in quitting.   Last pulmonary function testing was done in January 2022 which showed mild obstructive lung disease/FEV1 1.42 (75% predicted).   No Known Allergies  Immunization History  Administered Date(s) Administered   Fluad Quad(high Dose 65+) 03/21/2020, 03/01/2022   Influenza Split 03/01/2015   PFIZER(Purple Top)SARS-COV-2 Vaccination 07/04/2019, 08/11/2019   PNEUMOCOCCAL CONJUGATE-20 03/01/2022    Pneumococcal Polysaccharide-23 10/14/2017    Past Medical History:  Diagnosis Date   Allergy    Bone spur 04/2021   spine   COPD (chronic obstructive pulmonary disease) ([place])    per pt told it was seen on an xray from an ER visit, not being treated   Heart murmur    Hypertension    Sciatica 04/2021   Scoliosis 04/2021    Tobacco History: Social History   Tobacco Use  Smoking Status Every Day   Packs/day: 0.25   Years: 50.00   Total pack years: 12.50   Types: Cigarettes  Smokeless Tobacco Never  Tobacco Comments   Currently smoking 5 cigarettes a day   1-800-QUIT-NOW (1-6076549621).   http://bell-fernandez.com/            1-800-QUIT-NOW 843-407-8524).   http://bell-fernandez.com/      Ready to quit: Not Answered Counseling given: Not Answered Tobacco comments: Currently smoking 5 cigarettes a day 1-800-QUIT-NOW (445)306-1870). http://bell-fernandez.com/    1-800-QUIT-NOW 951-335-9293). http://bell-fernandez.com/    Outpatient Medications Prior to Visit  Medication Sig Dispense Refill   albuterol (VENTOLIN HFA) 108 (90 Base) MCG/ACT inhaler Inhale 2 puffs into the lungs every 6 (six) hours as needed for wheezing or shortness of breath. 18 g 0   amLODipine (NORVASC) 2.5 MG tablet Take 2.5 mg by mouth daily.     cholecalciferol (VITAMIN D3) 25 MCG (1000 UNIT) tablet Take 1,000 Units by mouth daily.     docusate sodium (COLACE) 50 MG capsule Take 50 mg by mouth 2 (two) times  daily as needed for mild constipation.     Fluticasone-Umeclidin-Vilant (TRELEGY ELLIPTA) 100-62.5-25 MCG/ACT AEPB Inhale 1 puff into the lungs daily. 1 each 6   guaiFENesin-dextromethorphan (ROBITUSSIN DM) 100-10 MG/5ML syrup Take 5 mLs by mouth every 4 (four) hours as needed for cough. 118 mL 0   ipratropium-albuterol (DUONEB) 0.5-2.5 (3) MG/3ML SOLN Take 3 mLs by nebulization every 6 (six) hours as needed. 360 mL 0   Iron-Vitamins (GERITOL COMPLETE PO) Take 1 tablet by mouth  daily.     metoprolol tartrate (LOPRESSOR) 25 MG tablet TAKE 1 TABLET(25 MG) BY MOUTH TWICE DAILY 180 tablet 1   pantoprazole (PROTONIX) 40 MG tablet Take 1 tablet (40 mg total) by mouth daily. 90 tablet 3   dicyclomine (BENTYL) 20 MG tablet Take 1 tablet (20 mg total) by mouth every 6 (six) hours as needed for spasms. 15 tablet 3   megestrol (MEGACE) 40 MG/ML suspension Take 10 mLs (400 mg total) by mouth daily. 240 mL 2   No facility-administered medications prior to visit.    Review of Systems  Review of Systems  Constitutional: Negative.   HENT: Negative.    Respiratory:  Positive for cough. Negative for chest tightness, shortness of breath and wheezing.   Cardiovascular: Negative.      Physical Exam  BP 118/72 (BP Location: Right Arm, Patient Position: Sitting, Cuff Size: Normal)   Pulse 79   Ht 5' 6.5" (1.689 m)   Wt 159 lb (72.1 kg)   SpO2 96%   BMI 25.28 kg/m  Physical Exam Constitutional:      Appearance: Normal appearance.  HENT:     Head: Normocephalic and atraumatic.  Cardiovascular:     Rate and Rhythm: Normal rate and regular rhythm.  Pulmonary:     Effort: Pulmonary effort is normal.     Breath sounds: Normal breath sounds.     Comments: + cough Neurological:     General: No focal deficit present.     Mental Status: She is alert and oriented to person, place, and time. Mental status is at baseline.  Psychiatric:        Mood and Affect: Mood normal.      Lab Results:  CBC    Component Value Date/Time   WBC 6.8 04/25/2022 0129   RBC 3.58 (L) 04/25/2022 0129   HGB 10.9 (L) 04/25/2022 0129   HCT 31.8 (L) 04/25/2022 0129   PLT 169 04/25/2022 0129   MCV 88.8 04/25/2022 0129   MCH 30.4 04/25/2022 0129   MCHC 34.3 04/25/2022 0129   RDW 13.2 04/25/2022 0129   LYMPHSABS 1,931 12/10/2019 1450   EOSABS 143 12/10/2019 1450   BASOSABS 52 12/10/2019 1450    BMET    Component Value Date/Time   NA 141 05/21/2022 1459   K 4.9 05/21/2022 1459   CL  105 05/21/2022 1459   CO2 21 05/21/2022 1459   GLUCOSE 78 05/21/2022 1459   GLUCOSE 125 (H) 04/25/2022 0129   BUN 17 05/21/2022 1459   CREATININE 1.24 (H) 05/21/2022 1459   CREATININE 1.45 (H) 12/10/2019 1450   CALCIUM 10.9 (H) 05/21/2022 1459   GFRNONAA 38 (L) 04/25/2022 0129   GFRNONAA 36 (L) 12/10/2019 1450   GFRAA 42 (L) 12/10/2019 1450    BNP    Component Value Date/Time   BNP 13.4 04/23/2022 1715    ProBNP No results found for: "PROBNP"  Imaging: No results found.   Assessment & Plan:   COPD (chronic obstructive pulmonary disease) ([place])  Hospitalized for COPD exacerbation in December 2023. CXR without infiltrates. Completed course of Doxycycline and prednisone. Clinically doing much better. Continues to have a productive cough with clear sputum. New oxygen requirements. PFTs in January 2022 showed mild obstructive lung disease/FEV1 1.42% (75% predicted).   Recommendations: - Continue to use Trelegy Ellipta 100 mcg-take 1 puff daily in the morning - Use albuterol 2 puffs every 6 hours as needed for breakthrough shortness of breath or wheezing - Take Robitussin 5 mL every 4-6 hours as needed for cough/chest congestion - Use flutter valve 2-3 times a day to loosen chest congestion/cough  - Due for repeat CAT scan for lung cancer screening in September 2024 - Encourage smoking cessation   Orders:  - Please give patient flutter valve  - Ambulatory walk on room air, titrate O2 if needed - Does not require supplemental oxygen with exertion we will check overnight oximetry test on room air to see if we can discontinue  Follow-up: - 6 months with Dr. Shearon Stalls   Chronic respiratory failure with hypoxia [facility]) - New requirements since hospitalized for COPD exacerbation in December. Checking Ambulatory walk on room air, titrate O2 if needed. If patient does not require supplemental oxygen with exertion we will check overnight oximetry test on room air to see if we can  discontinue      Martyn Ehrich, NP 06/03/2022

## 2022-05-25 DIAGNOSIS — R011 Cardiac murmur, unspecified: Secondary | ICD-10-CM | POA: Insufficient documentation

## 2022-05-25 NOTE — Assessment & Plan Note (Addendum)
Blood pressure is well-controlled on current dose of beta-blocker and amlodipine.  Both are incidentally appropriate medications for possible angina.

## 2022-05-25 NOTE — Assessment & Plan Note (Signed)
Pain more consistent with Epigastric Pain Suspect gastric in origin given characteristics and relief with passing gas. Non-exertional. Troponin negative. Known history of gastritis and Schatzi ring of the distal esophagus. Does have cardiac risk factors though. Patient with history of hypertension well-controlled on very low dose amlodipine. Not on statin. Last LDL 113, but that was in 2020. Patient is a smoker.  - Lipid profile, A1c, chemistry for cardiac risk stratification - Echocardiogram as below - Do not see an indication for ischemic evaluation at this time if echo is normal - Smoking cessation. Down to 3-4 cigarettes/day--plans to pursue cessation with the assistance of her pulmonologist   Pain is in the lower end of the precordium but still somewhat concerning based on her sister's history of heart disease. Not overly convinced that this is truly cardiac related pain.  Will check a 2D echo because the murmur and evaluate cardiac risk factors with A1c and lipid panel.  If there is any wall motion normalities or other abnormalities on the echocardiogram that would suggest the possibility of ischemic CAD, we can consider Coronary CTA for baseline assessment and precordial pain evaluation.  For now we will hold off. Continue beta-blocker and amlodipine. Smoking cessation counseling

## 2022-05-25 NOTE — Progress Notes (Signed)
ATTENDING ATTESTATION  I have seen, examined and evaluated the patient along with the Resident Physician in clinic today.  I personally performed my own interview & exanimation.  After reviewing all the available data and chart, we discussed the patients laboratory, study & physical findings as well as symptoms in detail. I agree with his findings, examination as well as impression recommendations as per our discussion.    Attending adjustments int the full clinic noted annotated in [place].     Glenetta Hew, MD

## 2022-05-25 NOTE — Assessment & Plan Note (Signed)
Spoke to patient about smoking cessation counseling. Smoking cessation instruction/counseling given:  counseled patient on the dangers of tobacco use, advised patient to stop smoking, and reviewed strategies to maximize success

## 2022-05-25 NOTE — Assessment & Plan Note (Signed)
Systolic Ejection Murmur Per patient report, has been present for some time but never evaluated. Suspicious for aortic stenosis based on murmur characteristics  - Echocardiogram  Murmur sounds consistent with at least aortic sclerosis but need to exclude aortic stenosis.  Will check 2D echo to evaluate aortic valve but also for cardiac function with diagnosis of epigastric/precordial pain.

## 2022-05-29 ENCOUNTER — Other Ambulatory Visit: Payer: Self-pay | Admitting: Internal Medicine

## 2022-05-29 ENCOUNTER — Other Ambulatory Visit: Payer: Self-pay | Admitting: Physician Assistant

## 2022-05-29 DIAGNOSIS — R634 Abnormal weight loss: Secondary | ICD-10-CM

## 2022-06-03 ENCOUNTER — Telehealth: Payer: Self-pay | Admitting: Primary Care

## 2022-06-03 NOTE — Assessment & Plan Note (Signed)
-   New requirements since hospitalized for COPD exacerbation in December. Checking Ambulatory walk on room air, titrate O2 if needed. If patient does not require supplemental oxygen with exertion we will check overnight oximetry test on room air to see if we can discontinue

## 2022-06-03 NOTE — Assessment & Plan Note (Addendum)
Hospitalized for COPD exacerbation in December 2023. CXR without infiltrates. Completed course of Doxycycline and prednisone. Clinically doing much better. Continues to have a productive cough with clear sputum. New oxygen requirements. PFTs in January 2022 showed mild obstructive lung disease/FEV1 1.42% (75% predicted).   Recommendations: - Continue to use Trelegy Ellipta 100 mcg-take 1 puff daily in the morning - Use albuterol 2 puffs every 6 hours as needed for breakthrough shortness of breath or wheezing - Take Robitussin 5 mL every 4-6 hours as needed for cough/chest congestion - Use flutter valve 2-3 times a day to loosen chest congestion/cough  - Due for repeat CAT scan for lung cancer screening in September 2024 - Encourage smoking cessation   Orders:  - Please give patient flutter valve  - Ambulatory walk on room air, titrate O2 if needed - Does not require supplemental oxygen with exertion we will check overnight oximetry test on room air to see if we can discontinue  Follow-up: - 6 months with Dr. Shearon Stalls

## 2022-06-05 NOTE — Telephone Encounter (Signed)
Spoke with patient. She states she spoke with a nurse yesterday and doesn't no longer need anything. Advised patient to call us back with any questions or concerns. Nothing further needed.

## 2022-06-11 ENCOUNTER — Ambulatory Visit (HOSPITAL_COMMUNITY): Payer: 59 | Attending: Cardiology

## 2022-06-12 ENCOUNTER — Encounter (HOSPITAL_COMMUNITY): Payer: Self-pay | Admitting: Cardiology

## 2022-06-28 ENCOUNTER — Ambulatory Visit (INDEPENDENT_AMBULATORY_CARE_PROVIDER_SITE_OTHER): Payer: 59 | Admitting: Nurse Practitioner

## 2022-06-28 VITALS — BP 128/86 | HR 89 | Temp 97.7°F | Ht 66.5 in | Wt 157.2 lb

## 2022-06-28 DIAGNOSIS — J449 Chronic obstructive pulmonary disease, unspecified: Secondary | ICD-10-CM

## 2022-06-28 DIAGNOSIS — I10 Essential (primary) hypertension: Secondary | ICD-10-CM | POA: Diagnosis not present

## 2022-06-28 MED ORDER — AMLODIPINE BESYLATE 2.5 MG PO TABS: 2.5000 mg | ORAL_TABLET | Freq: Every day | ORAL | 3 refills | Status: DC

## 2022-06-28 MED ORDER — METOPROLOL TARTRATE 25 MG PO TABS: 25.0000 mg | ORAL_TABLET | Freq: Two times a day (BID) | ORAL | 3 refills | Status: DC

## 2022-06-28 NOTE — Assessment & Plan Note (Addendum)
Chronic, symptoms much improved compared to last time I saw her.  Patient encouraged to follow-up with pulmonology as scheduled.  Continue albuterol as needed, Trelegy daily, and DuoNeb as needed.

## 2022-06-28 NOTE — Assessment & Plan Note (Signed)
Chronic, well-controlled on current regimen.  Refill of amlodipine 2.5 mg daily and metoprolol 25 mg twice a day sent to patient's pharmacy.

## 2022-06-28 NOTE — Progress Notes (Signed)
Established Patient Office Visit  Subjective   Patient ID: Virginia Watson, female    DOB: 1946/01/06  Age: 77 y.o. MRN: [id number]  Chief Complaint  Patient presents with   COPD    Patient here for follow-up regarding COPD and chest pain.  Is now established with pulmonologist, no longer requiring chronic oxygen.  Is awaiting to undergo overnight oximetry testing.  Also reports chest pain has resolved.  Has seen cardiology awaiting echocardiogram which is scheduled within the next couple of weeks.  Was prescribed pantoprazole but patient reports she has no heartburn symptoms and underwent testing with GI was told she does not have evidence of reflux.  Would like to discontinue this medication.  Requesting refill on amlodipine which she takes for hypertension in addition to her metoprolol    Review of Systems  Respiratory:  Negative for shortness of breath.   Cardiovascular:  Negative for chest pain.  Gastrointestinal:  Negative for heartburn.      Objective:     BP 128/86   Pulse 89   Temp 97.7 F (36.5 C) (Temporal)   Ht 5' 6.5" (1.689 m)   Wt 157 lb 4 oz (71.3 kg)   BMI 25.00 kg/m  BP Readings from Last 3 Encounters:  06/28/22 128/86  05/24/22 118/72  05/21/22 122/80   Wt Readings from Last 3 Encounters:  06/28/22 157 lb 4 oz (71.3 kg)  05/24/22 159 lb (72.1 kg)  05/21/22 161 lb (73 kg)      Physical Exam Vitals reviewed.  Constitutional:      General: She is not in acute distress.    Appearance: Normal appearance.  HENT:     Head: Normocephalic and atraumatic.  Neck:     Vascular: No carotid bruit.  Cardiovascular:     Rate and Rhythm: Normal rate and regular rhythm.     Pulses: Normal pulses.     Heart sounds: Normal heart sounds.  Pulmonary:     Effort: Pulmonary effort is normal.     Breath sounds: Normal breath sounds.  Skin:    General: Skin is warm and dry.  Neurological:     General: No focal deficit present.     Mental Status: She  is alert and oriented to person, place, and time.  Psychiatric:        Mood and Affect: Mood normal.        Behavior: Behavior normal.        Judgment: Judgment normal.      No results found for any visits on 06/28/22.    The 10-year ASCVD risk score (Arnett DK, et al., 2019) is: 19.7%    Assessment & Plan:  Will discontinue pantoprazole per patient preference. Problem List Items Addressed This Visit       Cardiovascular and Mediastinum   Essential hypertension (Chronic)    Chronic, well-controlled on current regimen.  Refill of amlodipine 2.5 mg daily and metoprolol 25 mg twice a day sent to patient's pharmacy.      Relevant Medications   amLODipine (NORVASC) 2.5 MG tablet   metoprolol tartrate (LOPRESSOR) 25 MG tablet     Respiratory   COPD (chronic obstructive pulmonary disease) ([facility]) - Primary    Chronic, symptoms much improved compared to last time I saw her.  Patient encouraged to follow-up with pulmonology as scheduled.  Continue albuterol as needed, Trelegy daily, and DuoNeb as needed.       Return in about 6 months (around 12/29/2022) for F/U with  Madison Direnzo.    Ailene Ards, NP

## 2022-07-01 ENCOUNTER — Telehealth: Payer: Self-pay

## 2022-07-01 DIAGNOSIS — K5710 Diverticulosis of small intestine without perforation or abscess without bleeding: Secondary | ICD-10-CM

## 2022-07-01 NOTE — Telephone Encounter (Signed)
CT order in epic. Secure staff message sent to radiology scheduling to contact patient to set up appt.   Called and spoke with patient to remind her that she is due for repeat CT scan at this time. Pt knows to expect a call from radiology scheduling to set up her appointment. Pt verbalized understanding and had no concerns at the end of the call.

## 2022-07-01 NOTE — Telephone Encounter (Signed)
-----   Message from Yevette Edwards, RN sent at 03/20/2022  1:21 PM EST ----- Regarding: CT due CT scan of the abdomen and pelvis - duodenal diverticulum abnormality - need to enter order

## 2022-07-09 ENCOUNTER — Ambulatory Visit (HOSPITAL_COMMUNITY): Payer: 59 | Attending: Cardiovascular Disease

## 2022-07-09 DIAGNOSIS — R011 Cardiac murmur, unspecified: Secondary | ICD-10-CM

## 2022-07-09 DIAGNOSIS — R072 Precordial pain: Secondary | ICD-10-CM

## 2022-07-09 HISTORY — PX: TRANSTHORACIC ECHOCARDIOGRAM: SHX275

## 2022-07-10 LAB — ECHOCARDIOGRAM COMPLETE
Area-P 1/2: 3.21 cm2
S' Lateral: 2.2 cm

## 2022-07-10 NOTE — Telephone Encounter (Signed)
CT scan is scheduled for Tuesday, 07/23/22 at 2:30 pm

## 2022-07-23 ENCOUNTER — Ambulatory Visit (HOSPITAL_COMMUNITY)
Admission: RE | Admit: 2022-07-23 | Discharge: 2022-07-23 | Disposition: A | Discharge status: Home / Self Care | Payer: 59 | Source: Ambulatory Visit | Attending: Physician Assistant | Admitting: Physician Assistant

## 2022-07-23 DIAGNOSIS — K5710 Diverticulosis of small intestine without perforation or abscess without bleeding: Secondary | ICD-10-CM | POA: Diagnosis not present

## 2022-07-23 MED ORDER — SODIUM CHLORIDE (PF) 0.9 % IJ SOLN
INTRAMUSCULAR | Status: AC
  Filled 2022-07-23: qty 50

## 2022-07-23 MED ORDER — IOHEXOL 300 MG/ML  SOLN
75.0000 mL | Freq: Once | INTRAMUSCULAR | Status: AC | PRN
  Administered 2022-07-23: 75 mL via INTRAVENOUS

## 2022-07-25 ENCOUNTER — Other Ambulatory Visit: Payer: Self-pay | Admitting: Nurse Practitioner

## 2022-07-25 DIAGNOSIS — R634 Abnormal weight loss: Secondary | ICD-10-CM

## 2022-07-30 ENCOUNTER — Other Ambulatory Visit: Payer: Self-pay

## 2022-07-30 DIAGNOSIS — R1084 Generalized abdominal pain: Secondary | ICD-10-CM

## 2022-07-30 DIAGNOSIS — R630 Anorexia: Secondary | ICD-10-CM

## 2022-07-30 DIAGNOSIS — K5710 Diverticulosis of small intestine without perforation or abscess without bleeding: Secondary | ICD-10-CM

## 2022-08-06 ENCOUNTER — Telehealth: Payer: Self-pay

## 2022-08-06 NOTE — Telephone Encounter (Signed)
Patient scheduled CT appt for 08/26/22 at 2 pm. No further information needed from patient.

## 2022-08-06 NOTE — Telephone Encounter (Signed)
CT enterography ordered on 4/2 - see lab result note  Radiology scheduling has contacted patient twice to schedule. Lm on vm for patient to return call to discuss.

## 2022-08-06 NOTE — Telephone Encounter (Signed)
Patient called returning your phone call.  °

## 2022-08-07 ENCOUNTER — Other Ambulatory Visit: Payer: Self-pay | Admitting: Physician Assistant

## 2022-08-26 ENCOUNTER — Encounter (HOSPITAL_COMMUNITY): Payer: Self-pay

## 2022-08-26 ENCOUNTER — Ambulatory Visit (HOSPITAL_COMMUNITY)
Admission: RE | Admit: 2022-08-26 | Discharge: 2022-08-26 | Disposition: A | Discharge status: Home / Self Care | Payer: 59 | Source: Ambulatory Visit | Attending: Physician Assistant | Admitting: Physician Assistant

## 2022-08-26 DIAGNOSIS — K5730 Diverticulosis of large intestine without perforation or abscess without bleeding: Secondary | ICD-10-CM | POA: Diagnosis not present

## 2022-08-26 DIAGNOSIS — Z23 Encounter for immunization: Secondary | ICD-10-CM | POA: Insufficient documentation

## 2022-08-26 DIAGNOSIS — K5711 Diverticulosis of small intestine without perforation or abscess with bleeding: Secondary | ICD-10-CM | POA: Diagnosis not present

## 2022-08-26 DIAGNOSIS — R1084 Generalized abdominal pain: Secondary | ICD-10-CM | POA: Insufficient documentation

## 2022-08-26 DIAGNOSIS — R630 Anorexia: Secondary | ICD-10-CM | POA: Insufficient documentation

## 2022-08-26 DIAGNOSIS — K5710 Diverticulosis of small intestine without perforation or abscess without bleeding: Secondary | ICD-10-CM | POA: Diagnosis not present

## 2022-08-26 LAB — POCT I-STAT CREATININE: Creatinine, Ser: 1.5 mg/dL — ABNORMAL HIGH (ref 0.44–1.00)

## 2022-08-26 MED ORDER — IOHEXOL 300 MG/ML  SOLN
100.0000 mL | Freq: Once | INTRAMUSCULAR | Status: AC | PRN
  Administered 2022-08-26: 80 mL via INTRAVENOUS

## 2022-08-26 MED ORDER — BARIUM SULFATE 0.1 % PO SUSP
ORAL | Status: AC
  Filled 2022-08-26: qty 3

## 2022-08-26 MED ORDER — SODIUM CHLORIDE (PF) 0.9 % IJ SOLN
INTRAMUSCULAR | Status: AC
  Filled 2022-08-26: qty 50

## 2022-08-27 DIAGNOSIS — N189 Chronic kidney disease, unspecified: Secondary | ICD-10-CM | POA: Diagnosis not present

## 2022-08-27 DIAGNOSIS — J449 Chronic obstructive pulmonary disease, unspecified: Secondary | ICD-10-CM | POA: Diagnosis not present

## 2022-08-27 DIAGNOSIS — N1832 Chronic kidney disease, stage 3b: Secondary | ICD-10-CM | POA: Diagnosis not present

## 2022-08-27 DIAGNOSIS — N1831 Chronic kidney disease, stage 3a: Secondary | ICD-10-CM | POA: Diagnosis not present

## 2022-08-27 DIAGNOSIS — D631 Anemia in chronic kidney disease: Secondary | ICD-10-CM | POA: Diagnosis not present

## 2022-08-27 DIAGNOSIS — I129 Hypertensive chronic kidney disease with stage 1 through stage 4 chronic kidney disease, or unspecified chronic kidney disease: Secondary | ICD-10-CM | POA: Diagnosis not present

## 2022-08-30 LAB — LAB REPORT - SCANNED
Creatinine, POC: 16104 mg/dL
EGFR: 43

## 2022-09-04 ENCOUNTER — Other Ambulatory Visit: Payer: Self-pay | Admitting: Nurse Practitioner

## 2022-09-04 DIAGNOSIS — J432 Centrilobular emphysema: Secondary | ICD-10-CM

## 2022-09-10 ENCOUNTER — Other Ambulatory Visit: Payer: Self-pay | Admitting: Nurse Practitioner

## 2022-09-10 DIAGNOSIS — J432 Centrilobular emphysema: Secondary | ICD-10-CM

## 2022-09-17 ENCOUNTER — Encounter: Payer: Self-pay | Admitting: Cardiology

## 2022-09-17 ENCOUNTER — Ambulatory Visit: Payer: 59 | Attending: Cardiology | Admitting: Cardiology

## 2022-09-17 VITALS — BP 108/80 | HR 81 | Ht 65.5 in | Wt 154.0 lb

## 2022-09-17 DIAGNOSIS — N1832 Chronic kidney disease, stage 3b: Secondary | ICD-10-CM | POA: Diagnosis not present

## 2022-09-17 DIAGNOSIS — I10 Essential (primary) hypertension: Secondary | ICD-10-CM | POA: Diagnosis not present

## 2022-09-17 DIAGNOSIS — R072 Precordial pain: Secondary | ICD-10-CM | POA: Diagnosis not present

## 2022-09-17 DIAGNOSIS — F17210 Nicotine dependence, cigarettes, uncomplicated: Secondary | ICD-10-CM

## 2022-09-17 DIAGNOSIS — R011 Cardiac murmur, unspecified: Secondary | ICD-10-CM

## 2022-09-17 DIAGNOSIS — J9611 Chronic respiratory failure with hypoxia: Secondary | ICD-10-CM

## 2022-09-17 NOTE — Progress Notes (Signed)
Primary Care Provider: Elenore Paddy, NP [facility] Cardiologist: Bryan Lemma, MD Electrophysiologist: None  Clinic Note: Chief Complaint  Patient presents with   Follow-up    4 months.   Shortness of Breath    A little better.   ===================================  ASSESSMENT/PLAN   Problem List Items Addressed This Visit       Cardiology Problems   Essential hypertension (Chronic)    Well-controlled blood pressure on current dose of amlodipine 2.5 mg and Lopressor 25 mg twice daily.  Seems to tolerate beta-blocker.        Other   Systolic ejection murmur - Primary (Chronic)    Echocardiogram was relatively nonrevealing.  No gross abnormalities on either aortic or mitral valve.  I suspect that the murmur is probably related to aortic sclerosis or potentially pulmonic valve sclerosis.      Stage 3b chronic kidney disease (HCC) (Chronic)    Was scheduled to follow-up with [facility].  Will defer further management of blood pressure PCP and nephrology.      Precordial pain    She really had epigastric pain and no longer having it.  No further cardiac evaluation required.      Cigarette nicotine dependence without complication (Chronic)    Counseling provided      Chronic respiratory failure with hypoxia (HCC) (Chronic)    Recent COPD exacerbation requiring hospitalization.  Not currently on oxygen.       ===================================  HPI:    Virginia Watson is a 77 y.o. female smoker with a PMH notable for COPD (with chronic hypoxic respiratory failure, on home O2), HTN & CKD 3B who presents today for 75-month follow-up evaluation of chest pain.  Originally seen at the request of Elenore Paddy, NP.  Virginia Watson was last seen on 05/18/2022 for evaluation of precordial pain.  She had had 3 episodes of what sounded like epigastric pain that occurred at rest and relieved with aspirin.  There was exertional.  They are  worse when lying down.  Relieved with passing gas.  Troponin normal.  Chronic dyspnea related to COPD.  She was on oxygen at the time.  She never indicated having any discomfort in her actual thoracic region mostly pointing to the epigastric region.  She denies any PND orthopnea.  No edema. 2D echo ordered for hypertension and aortic murmur.  Recent Hospitalizations: n/a  Reviewed  CV studies:    The following studies were reviewed today: (if available, images/films reviewed: From Epic Chart or Care Everywhere) Echo 07/09/2022: Normal LV Fxn - LVEF 65-70%.  No RWMA. Mild Concentric LVH. Gr 1 DD - normal for age.  Normal Strain. Normal RV & RVP/RAP. Normal MV & AoV (~aortic sclerosis)  Interval History:   Virginia Watson presents here today to discuss results of her echocardiogram.  She herself is doing better.  She is exercising better less dyspneic.  Not currently using oxygen.  She sometimes uses it at night or if she is going to be exerting herself.   No recurrent chest pain or pressure with rest or exertion.  No PND or orthopnea. No edema.  CV Review of Symptoms (Summary): positive for - dyspnea on exertion, shortness of breath, and pretty much stable negative for - chest pain, edema, irregular heartbeat, orthopnea, palpitations, paroxysmal nocturnal dyspnea, rapid heart rate, or lightheadedness dizziness or wooziness, syncope/near syncope or TIA/amaurosis fugax, claudication  REVIEWED OF SYSTEMS   Review of Systems  Constitutional:  Negative for  malaise/fatigue (Does not have a lot of energy, but is getting better.) and weight loss.  Respiratory:  Positive for cough, shortness of breath and wheezing. Negative for sputum production.   Gastrointestinal:  Negative for abdominal pain, blood in stool, heartburn, melena and nausea.  Genitourinary:  Negative for hematuria.  Musculoskeletal:  Positive for joint pain (Mild aches and pains).  Neurological:  Negative for dizziness.     I have reviewed and (if needed) personally updated the patient's problem list, medications, allergies, past medical and surgical history, social and family history.   PAST MEDICAL HISTORY   Past Medical History:  Diagnosis Date   Allergy    Bone spur 04/2021   spine   COPD (chronic obstructive pulmonary disease) (HCC)    per pt told it was seen on an xray from an ER visit, not being treated   Heart murmur 2024   Normal Valves on Echo --likely related to aortic sclerosis.   Hypertension    Sciatica 04/2021   Scoliosis 04/2021    PAST SURGICAL HISTORY   Past Surgical History:  Procedure Laterality Date   TRANSTHORACIC ECHOCARDIOGRAM  07/09/2022   Normal LV Fxn - LVEF 65-70%.  No RWMA. Mild Concentric LVH. Gr 1 DD - normal for age.  Normal Strain. Normal RV & RVP/RAP. Normal MV & AoV (~aortic sclerosis)   UPPER GASTROINTESTINAL ENDOSCOPY      Immunization History  Administered Date(s) Administered   Fluad Quad(high Dose 65+) 03/21/2020, 03/01/2022   Influenza Split 03/01/2015   PFIZER(Purple Top)SARS-COV-2 Vaccination 07/04/2019, 08/11/2019   PNEUMOCOCCAL CONJUGATE-20 03/01/2022   Pneumococcal Polysaccharide-23 10/14/2017    MEDICATIONS/ALLERGIES   Current Meds  Medication Sig   albuterol (VENTOLIN HFA) 108 (90 Base) MCG/ACT inhaler INHALE TWO puffs by MOUTH into THE lungs EVERY SIX HOURS AS NEEDED   amLODipine (NORVASC) 2.5 MG tablet Take 1 tablet (2.5 mg total) by mouth daily.   cholecalciferol (VITAMIN D3) 25 MCG (1000 UNIT) tablet Take 1,000 Units by mouth daily.   dicyclomine (BENTYL) 20 MG tablet Take 1 tablet (20 mg total) by mouth every 6 (six) hours as needed for spasms.   docusate sodium (COLACE) 50 MG capsule Take 50 mg by mouth 2 (two) times daily as needed for mild constipation.   Fluticasone-Umeclidin-Vilant (TRELEGY ELLIPTA) 100-62.5-25 MCG/ACT AEPB Inhale 1 puff into the lungs daily.   guaiFENesin-dextromethorphan (ROBITUSSIN DM) 100-10 MG/5ML syrup  Take 5 mLs by mouth every 4 (four) hours as needed for cough.   Iron-Vitamins (GERITOL COMPLETE PO) Take 1 tablet by mouth daily.   metoprolol tartrate (LOPRESSOR) 25 MG tablet Take 1 tablet (25 mg total) by mouth 2 (two) times daily.   [DISCONTINUED] megestrol (MEGACE) 40 MG/ML suspension Take 10 mLs (400 mg total) by mouth daily.    No Known Allergies  SOCIAL HISTORY/FAMILY HISTORY   Reviewed in Epic:  Pertinent findings: Still smoking 1/4 pack a day; Not ready to quit   OBJCTIVE -PE, EKG, labs   Wt Readings from Last 3 Encounters:  09/25/22 155 lb (70.3 kg)  09/17/22 154 lb (69.9 kg)  06/28/22 157 lb 4 oz (71.3 kg)    Physical Exam: BP 108/80 (BP Location: Left Arm, Patient Position: Sitting, Cuff Size: Normal)   Pulse 81   Ht 5' 5.5" (1.664 m)   Wt 154 lb (69.9 kg)   SpO2 95%   BMI 25.24 kg/m  Physical Exam Vitals reviewed.  Constitutional:      General: She is not in acute distress.  Appearance: She is normal weight. She is ill-appearing (Somewhat frail, ill-appearing). She is not toxic-appearing.  HENT:     Head: Normocephalic and atraumatic.  Neck:     Vascular: No carotid bruit.  Cardiovascular:     Rate and Rhythm: Normal rate and regular rhythm.     Pulses: Normal pulses.     Heart sounds: Murmur (2-3/6 SEM at RUSB) heard.     No friction rub. No gallop.  Pulmonary:     Effort: Accessory muscle usage present. No respiratory distress.     Breath sounds: Normal breath sounds. No wheezing. Decreased breath sounds: Diminished breath sounds throughout.. Chest:     Chest wall: No tenderness.  Musculoskeletal:        General: No swelling. Normal range of motion.     Cervical back: Normal range of motion and neck supple.  Skin:    General: Skin is warm and dry.  Neurological:     General: No focal deficit present.     Mental Status: She is alert and oriented to person, place, and time.  Psychiatric:        Mood and Affect: Mood normal.        Behavior:  Behavior normal.        Thought Content: Thought content normal.        Judgment: Judgment normal.     Adult ECG Report Not checked   Recent Labs: Reviewed Lab Results  Component Value Date   CHOL 159 05/21/2022   HDL 36 (L) 05/21/2022   LDLCALC 103 (H) 05/21/2022   TRIG 108 05/21/2022   CHOLHDL 4.4 05/21/2022   Lab Results  Component Value Date   CREATININE 1.21 (H) 09/25/2022   BUN 15 09/25/2022   NA 139 09/25/2022   K 3.9 09/25/2022   CL 106 09/25/2022   CO2 26 09/25/2022      Latest Ref Rng & Units 04/25/2022    1:29 AM 04/23/2022    5:15 PM 03/01/2022    1:53 PM  CBC  WBC 4.0 - 10.5 K/uL 6.8  5.0  6.4   Hemoglobin 12.0 - 15.0 g/dL 16.1  09.6  04.5   Hematocrit 36.0 - 46.0 % 31.8  36.0  37.8   Platelets 150 - 400 K/uL 169  179  186.0     Lab Results  Component Value Date   HGBA1C 6.0 (H) 05/21/2022   Lab Results  Component Value Date   TSH 1.28 12/10/2019    ================================================== I spent a total of 24 minutes with the patient spent in direct patient consultation.  Additional time spent with chart review  / charting (studies, outside notes, etc): 13 min Total Time: 37 min  Current medicines are reviewed at length with the patient today.  (+/- concerns) none  Notice: This dictation was prepared with Dragon dictation along with smart phrase technology. Any transcriptional errors that result from this process are unintentional and may not be corrected upon review.  Studies Ordered:   No orders of the defined types were placed in this encounter.  No orders of the defined types were placed in this encounter.   Patient Instructions / Medication Changes & Studies & Tests Ordered   Patient Instructions  Medication Instructions:   No changes    Lab Work: Not needed    Testing/Procedures:   Not needed  Follow-Up: At [facility], you and your health needs are our priority.  As part of our continuing mission to  provide you with  exceptional heart care, we have created designated Provider Care Teams.  These Care Teams include your primary Cardiologist (physician) and Advanced Practice Providers (APPs -  Physician Assistants and Nurse Practitioners) who all work together to provide you with the care you need, when you need it.     Your next appointment:    As needed  The format for your next appointment:   In Person  Provider:   Bryan Lemma, MD    Other Instructions      Marykay Lex, MD, MS Bryan Lemma, M.D., M.S. Interventional Cardiologist  [facility]  Pager # [phone] Phone # [phone] [address] [place], [place] [address]   Thank you for choosing [facility] [place]!!

## 2022-09-17 NOTE — Patient Instructions (Signed)
Medication Instructions:    No changes    Lab Work: Not needed    Testing/Procedures: Not needed   Follow-Up: At [facility], you and your health needs are our priority.  As part of our continuing mission to provide you with exceptional heart care, we have created designated Provider Care Teams.  These Care Teams include your primary Cardiologist (physician) and Advanced Practice Providers (APPs -  Physician Assistants and Nurse Practitioners) who all work together to provide you with the care you need, when you need it.     Your next appointment:   As needed   The format for your next appointment:   In Person  Provider:   David Harding, MD   Other Instructions  

## 2022-09-25 ENCOUNTER — Encounter: Payer: Self-pay | Admitting: Internal Medicine

## 2022-09-25 ENCOUNTER — Ambulatory Visit (INDEPENDENT_AMBULATORY_CARE_PROVIDER_SITE_OTHER): Payer: 59 | Admitting: Internal Medicine

## 2022-09-25 DIAGNOSIS — M8580 Other specified disorders of bone density and structure, unspecified site: Secondary | ICD-10-CM

## 2022-09-25 LAB — BASIC METABOLIC PANEL
BUN: 15 mg/dL (ref 6–23)
CO2: 26 mEq/L (ref 19–32)
Calcium: 10.7 mg/dL — ABNORMAL HIGH (ref 8.4–10.5)
Chloride: 106 mEq/L (ref 96–112)
Creatinine, Ser: 1.21 mg/dL — ABNORMAL HIGH (ref 0.40–1.20)
GFR: 44.15 mL/min — ABNORMAL LOW (ref >60.00)
Glucose, Bld: 88 mg/dL (ref 70–99)
Potassium: 3.9 mEq/L (ref 3.5–5.1)
Sodium: 139 mEq/L (ref 135–145)

## 2022-09-25 LAB — ALBUMIN: Albumin: 4.3 g/dL (ref 3.5–5.2)

## 2022-09-25 LAB — VITAMIN D 25 HYDROXY (VIT D DEFICIENCY, FRACTURES): VITD: 57.83 ng/mL (ref 30.00–100.00)

## 2022-09-25 NOTE — Progress Notes (Unsigned)
Name: Virginia Watson  MRN/ DOB: [id number], 03-27-1946    Age/ Sex: 77 y.o., female    PCP: Elenore Paddy, NP   Reason for Endocrinology Evaluation: Hypercalcemia     Date of Initial Endocrinology Evaluation: 09/25/2022     HPI: Ms. Virginia Watson is a 77 y.o. female with a past medical history of emphysema, HTN. The patient presented for initial endocrinology clinic visit on 09/25/2022 for consultative assistance with her Hypercalcemia.    Ms. Virginia Watson indicates that she was first diagnosed with hypercalcemia in 12/2021 with a max level of 11.3 mg/dL (uncorrected) on 10/01/7844 , with inappropriate normal PTH 34 pg/mL in  02/2022  Hypercalcemia was noted after starting vitamin D 1000 iu daily , prior to that the patient has been noted with hypocalcemia  She follows with nephrology for CKD III  Since that time, she does have constipation, denies polydipsia  and has polyuria that she attributes to drinking plenty of water  She denies  use of over the counter calcium (including supplements, Tums, Rolaids, or other calcium containing antacids),or  lithium,   She was on HCTZ, which was discontinued 02/2022  She has CKD III but no history of kidney stones, liver disease, granulomatous disease. She denies  osteoporosis or prior fractures. Daily dietary calcium intake: 1 servings . She denies  family history of osteoporosis, parathyroid disease, niece  with goiter   Of note, the patient has been evaluated by GI for anorexia and weight loss, EGD revealed H. pylori gastritis and duodenal ulcers , weight has stabilized on Megace.  She was treated for H. Pylori    HISTORY:  Past Medical History:  Past Medical History:  Diagnosis Date   Allergy    Bone spur 04/2021   spine   COPD (chronic obstructive pulmonary disease) (HCC)    per pt told it was seen on an xray from an ER visit, not being treated   Heart murmur    Hypertension    Sciatica 04/2021   Scoliosis 04/2021    Past Surgical History:  Past Surgical History:  Procedure Laterality Date   UPPER GASTROINTESTINAL ENDOSCOPY      Social History:  reports that she has been smoking cigarettes. She has a 12.50 pack-year smoking history. She has never used smokeless tobacco. She reports current alcohol use. She reports that she does not use drugs. Family History: family history includes Alcohol abuse in her brother, father, and mother; Asthma in her mother; Colon cancer in her brother; Colon polyps in her sister; Depression in her sister; Drug abuse in her brother; Heart attack in her father; Hyperlipidemia in her mother; Hypertension in her sister.   HOME MEDICATIONS: Allergies as of 09/25/2022   No Known Allergies      Medication List        Accurate as of Sep 25, 2022  1:22 PM. If you have any questions, ask your nurse or doctor.          STOP taking these medications    ipratropium-albuterol 0.5-2.5 (3) MG/3ML Soln Commonly known as: DUONEB Stopped by: Virginia Shorts, MD       TAKE these medications    albuterol 108 (90 Base) MCG/ACT inhaler Commonly known as: VENTOLIN HFA INHALE TWO puffs by MOUTH into THE lungs EVERY SIX HOURS AS NEEDED   amLODipine 2.5 MG tablet Commonly known as: NORVASC Take 1 tablet (2.5 mg total) by mouth daily.   cholecalciferol 25 MCG (1000 UNIT) tablet Commonly  known as: VITAMIN D3 Take 1,000 Units by mouth daily.   dicyclomine 20 MG tablet Commonly known as: BENTYL Take 1 tablet (20 mg total) by mouth every 6 (six) hours as needed for spasms.   docusate sodium 50 MG capsule Commonly known as: COLACE Take 50 mg by mouth 2 (two) times daily as needed for mild constipation.   GERITOL COMPLETE PO Take 1 tablet by mouth daily.   guaiFENesin-dextromethorphan 100-10 MG/5ML syrup Commonly known as: ROBITUSSIN DM Take 5 mLs by mouth every 4 (four) hours as needed for cough.   megestrol 40 MG/ML suspension Commonly known as: MEGACE Take 10  mLs (400 mg total) by mouth daily.   metoprolol tartrate 25 MG tablet Commonly known as: LOPRESSOR Take 1 tablet (25 mg total) by mouth 2 (two) times daily.   Trelegy Ellipta 100-62.5-25 MCG/ACT Aepb Generic drug: Fluticasone-Umeclidin-Vilant Inhale 1 puff into the lungs daily.          REVIEW OF SYSTEMS: A comprehensive ROS was conducted with the patient and is negative except as per HPI    OBJECTIVE:  VS: BP 120/80 (BP Location: Left Arm, Patient Position: Sitting, Cuff Size: Large)   Pulse 85   Ht 5' 5.5" (1.664 m)   Wt 155 lb (70.3 kg)   SpO2 91%   BMI 25.40 kg/m    Wt Readings from Last 3 Encounters:  09/25/22 155 lb (70.3 kg)  09/17/22 154 lb (69.9 kg)  06/28/22 157 lb 4 oz (71.3 kg)     EXAM: General: Pt appears well and is in NAD  Neck: General: Supple without adenopathy. Thyroid: Thyroid size normal.  No goiter or nodules appreciated.  Lungs: Clear with good BS bilat   Heart: Auscultation: RRR.  Extremities:  BL LE: No pretibial edema   Mental Status: Judgment, insight: Intact Orientation: Oriented to time, place, and person Mood and affect: No depression, anxiety, or agitation     DATA REVIEWED: ***   DXA 12/20/2021  TECHNIQUE: An axial (e.g., hips, spine) and/or appendicular (e.g., radius) exam was performed, as appropriate, using Haematologist at [facility]. Images are obtained for bone mineral density measurement and are not obtained for diagnostic purposes. [id number]   Exclusions: Lumbar spine due to degenerative disc disease.   COMPARISON:  None.   FINDINGS: Scan quality: Good.   LEFT FEMORAL NECK:   BMD (in g/cm2): 0.785   T-score: -1.2   Z-score: 0.4   LEFT TOTAL HIP:   BMD (in g/cm2): 0.944   T-score: -0.6   Z-score: 0.7   LEFT FOREARM (RADIUS 33%):   BMD (in g/cm2): 0.593   T-score: -1.7   Z-score: 0.7   FRAX 10-YEAR PROBABILITY OF FRACTURE:   10-year fracture risk is performed  using the [facility] FRAX calculator based on patient-reported risk factors.   Major osteoporotic fracture: 3.8 %   Hip fracture: 0.4 %   IMPRESSION: Osteopenia based on BMD.  ASSESSMENT/PLAN/RECOMMENDATIONS:   Hypercalcemia :  -Discussed pathophysiology of primary hyperparathyroidism -Is unclear if this is purely PTH mediated, we will also order PTH-RP -We also discussed risk of hypercalcemia on every organ damage -She was on HCTZ in 2023 but this has been discontinued -We will proceed with 24-hour urinary excretion of calcium - DXA low bone density 11/2021   Recommendations Stay hydrated Avoid over-the-counter calcium tablets Consume 2-3 servings of calcium daily  2.  Low bone density:   -We discussed the risk of progression to osteoporosis -She has no  history of fractures -Emphasized importance of optimizing vitamin D intake as well as optimizing dietary calcium intake    Follow-up in 5 months  Signed electronically by: Lyndle Herrlich, MD  [facility] Endocrinology  [facility] [address] [place] [place], [place] [address] Phone: [phone] FAX: [phone]   CC: Elenore Paddy, NP [address] [place] [address] Phone: [phone] Fax: [phone]   Return to Endocrinology clinic as below: No future appointments.

## 2022-09-25 NOTE — Patient Instructions (Addendum)
Stay Hydrated  Avoid over the counter calcium tablet  Consume 2-3 servings of dietary calcium through  food such as low fat dairy or green leafy vegetables     24-Hour Urine Collection  You will be collecting your urine for a 24-hour period of time. Your timer starts with your first urine of the morning (For example - If you first pee at 9AM, your timer will start at 9AM) Throw away your first urine of the morning Collect your urine every time you pee for the next 24 hours STOP your urine collection 24 hours after you started the collection (For example - You would stop at 9AM the day after you started)

## 2022-09-26 ENCOUNTER — Other Ambulatory Visit: Payer: Self-pay | Admitting: Nurse Practitioner

## 2022-09-26 DIAGNOSIS — R634 Abnormal weight loss: Secondary | ICD-10-CM

## 2022-09-26 LAB — EXTRA SPECIMEN

## 2022-09-27 ENCOUNTER — Ambulatory Visit: Payer: 59

## 2022-09-27 ENCOUNTER — Other Ambulatory Visit: Payer: 59

## 2022-09-27 LAB — PARATHYROID HORMONE, INTACT (NO CA): PTH: 62 pg/mL (ref 16–77)

## 2022-09-28 LAB — EXTRA SPECIMEN

## 2022-09-28 LAB — CALCIUM, URINE, 24 HOUR: Calcium, 24H Urine: 42 mg/24 h

## 2022-09-28 LAB — CREATININE, URINE, 24 HOUR: Creatinine, 24H Ur: 0.88 g/(24.h) (ref 0.50–2.15)

## 2022-09-29 ENCOUNTER — Encounter: Payer: Self-pay | Admitting: Cardiology

## 2022-09-29 NOTE — Assessment & Plan Note (Signed)
Was scheduled to follow-up with [facility].  Will defer further management of blood pressure PCP and nephrology.

## 2022-09-29 NOTE — Assessment & Plan Note (Signed)
Counseling provided

## 2022-09-29 NOTE — Assessment & Plan Note (Signed)
Echocardiogram was relatively nonrevealing.  No gross abnormalities on either aortic or mitral valve.  I suspect that the murmur is probably related to aortic sclerosis or potentially pulmonic valve sclerosis.

## 2022-09-29 NOTE — Assessment & Plan Note (Signed)
Well-controlled blood pressure on current dose of amlodipine 2.5 mg and Lopressor 25 mg twice daily.  Seems to tolerate beta-blocker.

## 2022-09-29 NOTE — Assessment & Plan Note (Signed)
Recent COPD exacerbation requiring hospitalization.  Not currently on oxygen.

## 2022-09-29 NOTE — Assessment & Plan Note (Signed)
She really had epigastric pain and no longer having it.  No further cardiac evaluation required.

## 2022-09-30 LAB — VITAMIN D 1,25 DIHYDROXY
Vitamin D 1, 25 (OH)2 Total: 51 pg/mL (ref 18–72)
Vitamin D2 1, 25 (OH)2: <8 pg/mL
Vitamin D3 1, 25 (OH)2: 51 pg/mL

## 2022-09-30 LAB — PTH-RELATED PEPTIDE

## 2022-09-30 LAB — CALCIUM, IONIZED: Calcium, Ion: 5.6 mg/dL — ABNORMAL HIGH (ref 4.7–5.5)

## 2022-10-30 ENCOUNTER — Other Ambulatory Visit: Payer: Self-pay | Admitting: Nurse Practitioner

## 2022-10-30 DIAGNOSIS — J449 Chronic obstructive pulmonary disease, unspecified: Secondary | ICD-10-CM

## 2022-11-22 ENCOUNTER — Other Ambulatory Visit: Payer: Self-pay | Admitting: Nurse Practitioner

## 2022-11-22 ENCOUNTER — Other Ambulatory Visit: Payer: Self-pay | Admitting: Physician Assistant

## 2022-11-22 DIAGNOSIS — R634 Abnormal weight loss: Secondary | ICD-10-CM

## 2022-11-27 ENCOUNTER — Other Ambulatory Visit: Payer: Self-pay | Admitting: Nurse Practitioner

## 2022-11-27 DIAGNOSIS — J432 Centrilobular emphysema: Secondary | ICD-10-CM

## 2022-12-02 ENCOUNTER — Telehealth: Payer: Self-pay | Admitting: Primary Care

## 2022-12-02 NOTE — Telephone Encounter (Signed)
ONO in March 2024 on room air showed patient still needs to continue to use nocturnal oxygen. SpO2 low 80%; basal oxygen leve 89%.. Patient spent 2 hours 28 mins with O2 <88%. Call patient and left detailed message.

## 2023-01-10 ENCOUNTER — Other Ambulatory Visit: Payer: Self-pay | Admitting: Nurse Practitioner

## 2023-01-10 DIAGNOSIS — R634 Abnormal weight loss: Secondary | ICD-10-CM

## 2023-01-16 ENCOUNTER — Other Ambulatory Visit: Payer: Self-pay | Admitting: Nurse Practitioner

## 2023-01-16 DIAGNOSIS — F17210 Nicotine dependence, cigarettes, uncomplicated: Secondary | ICD-10-CM

## 2023-01-24 ENCOUNTER — Ambulatory Visit: Payer: 59 | Admitting: Family Medicine

## 2023-02-05 ENCOUNTER — Other Ambulatory Visit: Payer: Self-pay | Admitting: Nurse Practitioner

## 2023-02-05 DIAGNOSIS — R634 Abnormal weight loss: Secondary | ICD-10-CM

## 2023-02-07 DIAGNOSIS — I129 Hypertensive chronic kidney disease with stage 1 through stage 4 chronic kidney disease, or unspecified chronic kidney disease: Secondary | ICD-10-CM | POA: Diagnosis not present

## 2023-02-07 DIAGNOSIS — N189 Chronic kidney disease, unspecified: Secondary | ICD-10-CM | POA: Diagnosis not present

## 2023-02-07 DIAGNOSIS — N1832 Chronic kidney disease, stage 3b: Secondary | ICD-10-CM | POA: Diagnosis not present

## 2023-02-07 DIAGNOSIS — D631 Anemia in chronic kidney disease: Secondary | ICD-10-CM | POA: Diagnosis not present

## 2023-02-10 DIAGNOSIS — N1831 Chronic kidney disease, stage 3a: Secondary | ICD-10-CM | POA: Diagnosis not present

## 2023-02-11 ENCOUNTER — Other Ambulatory Visit: Payer: Self-pay | Admitting: Nurse Practitioner

## 2023-02-11 DIAGNOSIS — J432 Centrilobular emphysema: Secondary | ICD-10-CM

## 2023-02-25 ENCOUNTER — Other Ambulatory Visit (HOSPITAL_COMMUNITY): Payer: Self-pay | Admitting: Nephrology

## 2023-02-28 ENCOUNTER — Encounter: Payer: Self-pay | Admitting: Internal Medicine

## 2023-02-28 ENCOUNTER — Ambulatory Visit (INDEPENDENT_AMBULATORY_CARE_PROVIDER_SITE_OTHER): Payer: 59 | Admitting: Internal Medicine

## 2023-02-28 DIAGNOSIS — M8580 Other specified disorders of bone density and structure, unspecified site: Secondary | ICD-10-CM | POA: Diagnosis not present

## 2023-02-28 LAB — ALBUMIN: Albumin: 4 g/dL (ref 3.5–5.2)

## 2023-02-28 LAB — BASIC METABOLIC PANEL
BUN: 15 mg/dL (ref 6–23)
CO2: 24 meq/L (ref 19–32)
Calcium: 10.4 mg/dL (ref 8.4–10.5)
Chloride: 107 meq/L (ref 96–112)
Creatinine, Ser: 1.44 mg/dL — ABNORMAL HIGH (ref 0.40–1.20)
GFR: 35.72 mL/min — ABNORMAL LOW (ref >60.00)
Glucose, Bld: 79 mg/dL (ref 70–99)
Potassium: 4.1 meq/L (ref 3.5–5.1)
Sodium: 139 meq/L (ref 135–145)

## 2023-02-28 LAB — VITAMIN D 25 HYDROXY (VIT D DEFICIENCY, FRACTURES): VITD: 45.47 ng/mL (ref 30.00–100.00)

## 2023-02-28 NOTE — Patient Instructions (Signed)
Stay Hydrated  Avoid over the counter calcium tablet  Consume 2-3 servings of dietary calcium through  food such as low fat dairy or green leafy vegetables

## 2023-02-28 NOTE — Progress Notes (Unsigned)
Name: Virginia Watson  MRN/ DOB: [id number], 01-17-1946    Age/ Sex: 77 y.o., female    PCP: Elenore Paddy, NP   Reason for Endocrinology Evaluation: Hypercalcemia     Date of Initial Endocrinology Evaluation: 09/25/2022     HPI: Ms. Virginia Watson is a 77 y.o. female with a past medical history of emphysema, HTN. The patient presented for initial endocrinology clinic visit on 09/25/2022 for consultative assistance with her Hypercalcemia.    Ms. Conaty indicates that she was first diagnosed with hypercalcemia in 12/2021 with a max level of 11.3 mg/dL (uncorrected) on 09/29/9526 , with inappropriate normal PTH 34 pg/mL in  02/2022  Hypercalcemia was noted after starting vitamin D 1000 iu daily , prior to that the patient has been noted with hypocalcemia  She follows with nephrology for CKD III     She was on HCTZ, which was discontinued 02/2022  She has CKD III but no history of kidney stones, liver disease, granulomatous disease.  She denies  osteoporosis or prior fractures.   She denies  family history of osteoporosis, parathyroid disease, niece  with goiter   Of note, the patient has been evaluated by GI for anorexia and weight loss, EGD revealed H. pylori gastritis and duodenal ulcers , weight has stabilized on Megace.  She was treated for H. Pylori  On her initial visit to our clinic she had a normal vitamin D, 1, 25 dihydroxy vitamin D, normal PTH is 62 PG/mL.  Slight elevation in ionized calcium 5.6 Mg/DL (4.1-3.2), serum calcium 10.7 mg/dL (corrected 44.01) (reference 8.4-10.5)  24-hour urine calcium was low at 42 Mg (08/2022)  DXA scan 11/2021-osteopenia  SUBJECTIVE:    Today (02/28/23):  Virginia Watson is here for follow-up on hypercalcemia.   Constipation has been improving She is on MVI  Denies polydipsia , has nocturia  Denies renal stones  Denies recent falls  Has insominia    HISTORY:  Past Medical History:  Past Medical  History:  Diagnosis Date   Allergy    Bone spur 04/2021   spine   COPD (chronic obstructive pulmonary disease) (HCC)    per pt told it was seen on an xray from an ER visit, not being treated   Heart murmur 2024   Normal Valves on Echo --likely related to aortic sclerosis.   Hypertension    Sciatica 04/2021   Scoliosis 04/2021   Past Surgical History:  Past Surgical History:  Procedure Laterality Date   TRANSTHORACIC ECHOCARDIOGRAM  07/09/2022   Normal LV Fxn - LVEF 65-70%.  No RWMA. Mild Concentric LVH. Gr 1 DD - normal for age.  Normal Strain. Normal RV & RVP/RAP. Normal MV & AoV (~aortic sclerosis)   UPPER GASTROINTESTINAL ENDOSCOPY      Social History:  reports that she has been smoking cigarettes. She has a 12.5 pack-year smoking history. She has never used smokeless tobacco. She reports current alcohol use. She reports that she does not use drugs. Family History: family history includes Alcohol abuse in her brother, father, and mother; Asthma in her mother; Colon cancer in her brother; Colon polyps in her sister; Depression in her sister; Drug abuse in her brother; Heart attack in her father; Hyperlipidemia in her mother; Hypertension in her sister.   HOME MEDICATIONS: Allergies as of 02/28/2023   No Known Allergies      Medication List        Accurate as of February 28, 2023  8:00 AM.  If you have any questions, ask your nurse or doctor.          albuterol 108 (90 Base) MCG/ACT inhaler Commonly known as: VENTOLIN HFA INHALE TWO puffs into THE lungs EVERY SIX HOURS AS NEEDED   amLODipine 2.5 MG tablet Commonly known as: NORVASC Take 1 tablet (2.5 mg total) by mouth daily.   amLODipine 5 MG tablet Commonly known as: NORVASC Take 5 mg by mouth daily.   cholecalciferol 25 MCG (1000 UNIT) tablet Commonly known as: VITAMIN D3 Take 1,000 Units by mouth daily.   dicyclomine 20 MG tablet Commonly known as: BENTYL TAKE ONE TABLET BY MOUTH EVERY SIX HOURS AS NEEDED  SPASMS   docusate sodium 50 MG capsule Commonly known as: COLACE Take 50 mg by mouth 2 (two) times daily as needed for mild constipation.   GERITOL COMPLETE PO Take 1 tablet by mouth daily.   guaiFENesin-dextromethorphan 100-10 MG/5ML syrup Commonly known as: ROBITUSSIN DM Take 5 mLs by mouth every 4 (four) hours as needed for cough.   megestrol 40 MG/ML suspension Commonly known as: MEGACE take 10ml BY MOUTH EVERY DAY   metoprolol tartrate 25 MG tablet Commonly known as: LOPRESSOR Take 1 tablet (25 mg total) by mouth 2 (two) times daily.   Trelegy Ellipta 100-62.5-25 MCG/ACT Aepb Generic drug: Fluticasone-Umeclidin-Vilant Inhale 1 puff into the lungs daily. Return in about 6 months (around 12/29/2022) for F/U with Sarah.          REVIEW OF SYSTEMS: A comprehensive ROS was conducted with the patient and is negative except as per HPI    OBJECTIVE:  VS: BP 120/80 (BP Location: Left Arm, Patient Position: Sitting, Cuff Size: Small)   Pulse 71   Ht 5' 5.5" (1.664 m)   Wt 156 lb (70.8 kg)   SpO2 96%   BMI 25.56 kg/m    Wt Readings from Last 3 Encounters:  02/28/23 156 lb (70.8 kg)  09/25/22 155 lb (70.3 kg)  09/17/22 154 lb (69.9 kg)     EXAM: General: Pt appears well and is in NAD  Neck: General: Supple without adenopathy. Thyroid: Thyroid size normal.  No goiter or nodules appreciated.  Lungs: Clear with good BS bilat   Heart: Auscultation: RRR.  Extremities:  BL LE: No pretibial edema   Mental Status: Judgment, insight: Intact Orientation: Oriented to time, place, and person Mood and affect: No depression, anxiety, or agitation     DATA REVIEWED:   Latest Reference Range & Units 02/28/23 08:17  Sodium 135 - 145 mEq/L 139  Potassium 3.5 - 5.1 mEq/L 4.1  Chloride 96 - 112 mEq/L 107  CO2 19 - 32 mEq/L 24  Glucose 70 - 99 mg/dL 79  BUN 6 - 23 mg/dL 15  Creatinine 1.61 - 0.96 mg/dL 0.45 (H)  Calcium 8.4 - 10.5 mg/dL 40.9  Calcium Ionized 4.7 - 5.5  mg/dL 5.8 (H)  Albumin 3.5 - 5.2 g/dL 4.0  GFR >81.19 mL/min 35.72 (L)    Latest Reference Range & Units 02/28/23 08:17  VITD 30.00 - 100.00 ng/mL 45.47     Latest Reference Range & Units 02/28/23 08:17  PTH, Intact 16 - 77 pg/mL 82 (H)          DXA 12/20/2021  TECHNIQUE: An axial (e.g., hips, spine) and/or appendicular (e.g., radius) exam was performed, as appropriate, using Haematologist at [facility]. Images are obtained for bone mineral density measurement and are not obtained for diagnostic purposes. [id number]   Exclusions:  Lumbar spine due to degenerative disc disease.   COMPARISON:  None.   FINDINGS: Scan quality: Good.   LEFT FEMORAL NECK:   BMD (in g/cm2): 0.785   T-score: -1.2   Z-score: 0.4   LEFT TOTAL HIP:   BMD (in g/cm2): 0.944   T-score: -0.6   Z-score: 0.7   LEFT FOREARM (RADIUS 33%):   BMD (in g/cm2): 0.593   T-score: -1.7   Z-score: 0.7   FRAX 10-YEAR PROBABILITY OF FRACTURE:   10-year fracture risk is performed using the [facility] FRAX calculator based on patient-reported risk factors.   Major osteoporotic fracture: 3.8 %   Hip fracture: 0.4 %   IMPRESSION: Osteopenia based on BMD.  ASSESSMENT/PLAN/RECOMMENDATIONS:   Hypercalcemia :  -High suspicion for FHH due to low 24-hour urinary calcium at 42 Mg 08/2022 -She was on HCTZ in 2023 but this has been discontinued - DXA low bone density 11/2021 -Serum calcium is normal with slight elevation in ionized calcium  , GFR is low, PTH elevated  with normal vitamin D - her nephrologist has ordered a parathyroid nuclear medicine scan  Recommendations Stay hydrated Avoid over-the-counter calcium tablets Consume 2-3 servings of calcium daily  2.  Low bone density:   -We discussed the risk of progression to osteoporosis -She has no history of fractures -Emphasized importance of optimizing vitamin D intake as well as optimizing  dietary calcium intake    Follow-up in 5 months  Signed electronically by: Lyndle Herrlich, MD  [facility] Endocrinology  [facility] [address] [place], [place] [address] Phone: [phone] FAX: [phone]   CC: Elenore Paddy, NP [address] [place] [address] Phone: [phone] Fax: [phone]   Return to Endocrinology clinic as below: Future Appointments  Date Time Provider Department Center  02/28/2023  8:10 AM Takeisha Cianci, Konrad Dolores, MD [id number] None

## 2023-03-02 LAB — CALCIUM, IONIZED: Calcium, Ion: 5.8 mg/dL — ABNORMAL HIGH (ref 4.7–5.5)

## 2023-03-02 LAB — PARATHYROID HORMONE, INTACT (NO CA): PTH: 82 pg/mL — ABNORMAL HIGH (ref 16–77)

## 2023-03-04 ENCOUNTER — Other Ambulatory Visit: Payer: Self-pay | Admitting: Nurse Practitioner

## 2023-03-04 DIAGNOSIS — J449 Chronic obstructive pulmonary disease, unspecified: Secondary | ICD-10-CM

## 2023-03-05 ENCOUNTER — Encounter (HOSPITAL_COMMUNITY): Payer: Self-pay | Admitting: *Deleted

## 2023-03-05 ENCOUNTER — Other Ambulatory Visit: Payer: Self-pay

## 2023-03-05 ENCOUNTER — Ambulatory Visit (HOSPITAL_COMMUNITY)
Admission: EM | Admit: 2023-03-05 | Discharge: 2023-03-05 | Disposition: A | Discharge status: Home / Self Care | Payer: 59 | Attending: Family Medicine | Admitting: Family Medicine

## 2023-03-05 DIAGNOSIS — R350 Frequency of micturition: Secondary | ICD-10-CM | POA: Diagnosis not present

## 2023-03-05 LAB — POCT URINALYSIS DIP (MANUAL ENTRY)
Bilirubin, UA: NEGATIVE
Glucose, UA: NEGATIVE mg/dL
Ketones, POC UA: NEGATIVE mg/dL
Nitrite, UA: NEGATIVE
Protein Ur, POC: NEGATIVE mg/dL
Spec Grav, UA: 1.015 (ref 1.010–1.025)
Urobilinogen, UA: 0.2 U/dL
pH, UA: 6 (ref 5.0–8.0)

## 2023-03-05 NOTE — ED Triage Notes (Signed)
Pt thinks she has a UTI and left a urine sample with her DR. Pt had a call from DR office does not remember name . Pt was told she had a UTI.

## 2023-03-06 NOTE — ED Provider Notes (Signed)
[facility]    ASSESSMENT & PLAN:  1. Urinary frequency    Denies CBG here.  Results for orders placed or performed during the hospital encounter of 03/05/23  POC urinalysis dipstick  Result Value Ref Range   Color, UA yellow yellow   Clarity, UA clear clear   Glucose, UA negative negative mg/dL   Bilirubin, UA negative negative   Ketones, POC UA negative negative mg/dL   Spec Grav, UA 4.098 1.191 - 1.025   Blood, UA moderate (A) negative   pH, UA 6.0 5.0 - 8.0   Protein Ur, POC negative negative mg/dL   Urobilinogen, UA 0.2 0.2 or 1.0 E.U./dL   Nitrite, UA Negative Negative   Leukocytes, UA Moderate (2+) (A) Negative   Without specific UTI symptoms. No signs of pyelonephritis. Urine culture sent. Will notify patient of any significant results. Ensure proper hydration. Will follow up with her PCP or here if not showing improvement over the next 48 hours, sooner if needed.  Outlined signs and symptoms indicating need for more acute intervention. Patient verbalized understanding. After Visit Summary given.  SUBJECTIVE:  Virginia Watson is a 77 y.o. female who reports that her doctor called her and thinks she may have UTI based on U/A done at their office. She reports no symptoms. Denies fever/chills/dysuria. Does complain of nocturia but reports this has been for years.  LMP: No LMP recorded. Patient is postmenopausal.  OBJECTIVE:  Vitals:   03/05/23 1351  BP: (!) 151/93  Pulse: 63  Resp: 20  Temp: 98.3 F (36.8 C)  SpO2: 91%   General appearance: alert; no distress HENT: oropharynx: moist Lungs: unlabored respirations Abdomen: soft, non-tender; bowel sounds normal; no masses or organomegaly; no guarding or rebound tenderness Back: no CVA tenderness Extremities: no edema; symmetrical with no gross deformities Skin: warm and dry Neurologic: normal gait Psychological: alert and cooperative; normal mood and affect  Labs Reviewed  POCT  URINALYSIS DIP (MANUAL ENTRY) - Abnormal; Notable for the following components:      Result Value   Blood, UA moderate (*)    Leukocytes, UA Moderate (2+) (*)    All other components within normal limits    No Known Allergies  Past Medical History:  Diagnosis Date   Allergy    Bone spur 04/2021   spine   COPD (chronic obstructive pulmonary disease) (HCC)    per pt told it was seen on an xray from an ER visit, not being treated   Heart murmur 2024   Normal Valves on Echo --likely related to aortic sclerosis.   Hypertension    Sciatica 04/2021   Scoliosis 04/2021   Social History   Socioeconomic History   Marital status: Single    Spouse name: Not on file   Number of children: 0   Years of education: 11th   Highest education level: Not on file  Occupational History   Not on file  Tobacco Use   Smoking status: Every Day    Current packs/day: 0.25    Average packs/day: 0.3 packs/day for 50.0 years (12.5 ttl pk-yrs)    Types: Cigarettes   Smokeless tobacco: Never   Tobacco comments:    Currently smoking 5 cigarettes a day    1-800-QUIT-NOW (1-725-853-8702).    [employer]                1-800-QUIT-NOW 657-447-7135).    [email or web address]      Vaping Use   Vaping status: Never Used  Substance  and Sexual Activity   Alcohol use: Yes    Comment: on holidays   Drug use: No   Sexual activity: Not Currently  Other Topics Concern   Not on file  Social History Narrative   Not on file   Social Determinants of Health   Financial Resource Strain: Low Risk  (03/28/2022)   Overall Financial Resource Strain (CARDIA)    Difficulty of Paying Living Expenses: Not hard at all  Food Insecurity: Food Insecurity Present (04/24/2022)   Hunger Vital Sign    Worried About Running Out of Food in the Last Year: Sometimes true    Ran Out of Food in the Last Year: Sometimes true  Transportation Needs: No Transportation Needs (04/24/2022)   PRAPARE -  Administrator, Civil Service (Medical): No    Lack of Transportation (Non-Medical): No  Physical Activity: Inactive (03/28/2022)   Exercise Vital Sign    Days of Exercise per Week: 0 days    Minutes of Exercise per Session: 0 min  Stress: No Stress Concern Present (03/28/2022)   [employer] of Occupational Health - Occupational Stress Questionnaire    Feeling of Stress : Not at all  Social Connections: Socially Isolated (03/28/2022)   Social Connection and Isolation Panel [NHANES]    Frequency of Communication with Friends and Family: More than three times a week    Frequency of Social Gatherings with Friends and Family: More than three times a week    Attends Religious Services: Never    Database administrator or Organizations: No    Attends Banker Meetings: Never    Marital Status: Never married  Intimate Partner Violence: Not At Risk (04/24/2022)   Humiliation, Afraid, Rape, and Kick questionnaire    Fear of Current or Ex-Partner: No    Emotionally Abused: No    Physically Abused: No    Sexually Abused: No   Family History  Problem Relation Age of Onset   Alcohol abuse Mother    Asthma Mother    Hyperlipidemia Mother    Alcohol abuse Father    Heart attack Father    Depression Sister    Hypertension Sister    Colon polyps Sister    Drug abuse Brother    Alcohol abuse Brother    Colon cancer Brother    Esophageal cancer Neg Hx    Stomach cancer Neg Hx    Rectal cancer Neg Hx    Breast cancer Neg Hx         Mardella Layman, MD 03/06/23 1041

## 2023-03-13 ENCOUNTER — Other Ambulatory Visit: Payer: Self-pay | Admitting: Nurse Practitioner

## 2023-03-13 DIAGNOSIS — J432 Centrilobular emphysema: Secondary | ICD-10-CM

## 2023-03-24 ENCOUNTER — Telehealth: Payer: Self-pay

## 2023-03-24 DIAGNOSIS — J9611 Chronic respiratory failure with hypoxia: Secondary | ICD-10-CM

## 2023-03-24 NOTE — Telephone Encounter (Signed)
Pt left voicemail on [facility] phone-I have spoke to patient and scheduled appt 05/11/22 at 1:30. She will need repeat ONO. ONO from March showed that she needed O2. Patient stated that she does not have oxygen currently. Order is out of 30 day window.   Routing to [employer] as an Financial planner

## 2023-03-25 NOTE — Telephone Encounter (Signed)
Why does it need to be repeated? This is just a continuation of oxygen, not a new start.

## 2023-03-25 NOTE — Addendum Note (Signed)
Addended by: Glenford Bayley on: 03/25/2023 04:46 PM   Modules accepted: Orders

## 2023-03-25 NOTE — Telephone Encounter (Signed)
Oh, ok. I'll re-order. Sorry about that confusion. Have her notify our office after completing so I can be on the look out for the results.

## 2023-03-25 NOTE — Telephone Encounter (Signed)
Spoke to patient to clarify. She stated that she returned her oxygen in Feb. She does not currently have oxygen at home.

## 2023-03-26 ENCOUNTER — Other Ambulatory Visit: Payer: Self-pay | Admitting: Nurse Practitioner

## 2023-03-26 DIAGNOSIS — I10 Essential (primary) hypertension: Secondary | ICD-10-CM

## 2023-03-26 NOTE — Telephone Encounter (Signed)
Noted. Pt is aware and voiced her understanding.  Nothing further needed.

## 2023-03-28 ENCOUNTER — Other Ambulatory Visit: Payer: Self-pay | Admitting: Nurse Practitioner

## 2023-03-28 DIAGNOSIS — I10 Essential (primary) hypertension: Secondary | ICD-10-CM

## 2023-04-02 ENCOUNTER — Other Ambulatory Visit: Payer: Self-pay | Admitting: Nurse Practitioner

## 2023-04-02 DIAGNOSIS — R634 Abnormal weight loss: Secondary | ICD-10-CM

## 2023-04-05 ENCOUNTER — Other Ambulatory Visit: Payer: Self-pay | Admitting: Nurse Practitioner

## 2023-04-05 DIAGNOSIS — R634 Abnormal weight loss: Secondary | ICD-10-CM

## 2023-05-05 ENCOUNTER — Other Ambulatory Visit: Payer: Self-pay | Admitting: Nurse Practitioner

## 2023-05-05 DIAGNOSIS — R634 Abnormal weight loss: Secondary | ICD-10-CM

## 2023-05-12 ENCOUNTER — Encounter: Payer: Self-pay | Admitting: Primary Care

## 2023-05-12 ENCOUNTER — Ambulatory Visit (INDEPENDENT_AMBULATORY_CARE_PROVIDER_SITE_OTHER): Payer: Medicare Other | Admitting: Primary Care

## 2023-05-12 VITALS — BP 160/83 | HR 73 | Temp 96.9°F | Ht 65.5 in | Wt 154.8 lb

## 2023-05-12 DIAGNOSIS — G4734 Idiopathic sleep related nonobstructive alveolar hypoventilation: Secondary | ICD-10-CM

## 2023-05-12 DIAGNOSIS — G479 Sleep disorder, unspecified: Secondary | ICD-10-CM

## 2023-05-12 NOTE — Patient Instructions (Signed)
-  NOCTURNAL HYPOXEMIA: Nocturnal hypoxemia means low oxygen  levels during sleep. We will order a home sleep study to check for sleep apnea, which could be causing your breathing issues at night. If sleep apnea is not found, we will consider resuming nocturnal oxygen  therapy.  -COPD WITH MODERATE TO SEVERE EMPHYSEMA: COPD with emphysema is a chronic lung condition that makes it hard to breathe. You are currently managing well with no significant daytime symptoms, so we will continue with your current treatment plan.  -FREQUENT NOCTURIA: Nocturia means waking up frequently at night to urinate. If this continues to disrupt your sleep after we address potential sleep apnea and nocturnal hypoxemia, we will evaluate other possible causes.  -DIFFICULTY ACCESSING MYCHART: You mentioned having trouble accessing messages and visit summaries on MyChart for QR code. We will assist you with this during today's visit.  INSTRUCTIONS: We will schedule a home sleep study to assess for sleep apnea. If sleep apnea is not present, we will consider resuming nocturnal oxygen  therapy. If frequent nocturia continues to disrupt your sleep, we will evaluate other potential causes.  Follow-up: Call 2 weeks after completing sleep study for results

## 2023-05-12 NOTE — Progress Notes (Signed)
 @Patient  ID: Virginia Watson, female    DOB: 05/04/1945, 78 y.o.   MRN: [id number]  Chief Complaint  Patient presents with   Follow-up    Referring provider: Elnor Lauraine BRAVO, NP  HPI: 78 year old female, current everyday smoker.  Past medical history significant for hypertension, COPD, chronic respiratory failure with hypoxia, stage IIIb chronic kidney disease, H. pylori infection, intentional weight loss.  Previous [facility] pulmonary encounter: 05/24/2022 Patient presents today for an overdue follow-up.  She was last seen 2 years ago by Dr. Meade for consult.  She was hospitalized in December 2023 for acute exacerbation of COPD.  Chest x-ray without infiltrates.  WBC normal.  Prolactin level not elevated.  She was treated with IV Rocephin  and 5-day course of doxycycline .  She had a CT of her lungs in September 2023 that showed 2 small left lung nodules measuring up to 3.3 mm.  No suspicious lung nodules were identified.    She is feeling better since being discharge. She completed doxycycline  and prednisone  taper. She has an occasional productive cough with clear sputum. She feels her breathing is pretty good. She is compliant with Trelegy. Uses albuterol  inhaler typically once daily at bedtime. Wearing 2 L of oxygen  24/7. This is new since December. She will at times take off 02 when walking to the bathroom and will forget to put it back on. States that her levels will be in high 90s. She is a current smoker, recently cut down to 4 cigarettes a day.  She is interested in quitting.   Last pulmonary function testing was done in January 2022 which showed mild obstructive lung disease/FEV1 1.42 (75% predicted).   05/12/2023 - Interim hx  Discussed the use of AI scribe software for clinical note transcription with the patient, who gave verbal consent to proceed.  Patient presents today for OV. She has a history of COPD and moderate-severe empphysema. PFTs in January 2022 showed mild obstructive  lung disease/ FEV1 1.42% (75% predicted). She has previously been walked in office and did not need supplemental oxygen  with exertion. She had an ONO in March 2024 that showed she spent 2 hours with O2 <88% and was advised to continue nocturnal oxygen .   She is here today to assess whether or not she needs nocturnal oxygen . She is maintained on Trelegy Ellipta  100mcg once daily.   She reports a tendency to hold her breath, particularly during activities such as tying her shoes. The patient also experiences frequent awakenings at night, up to three to four times, primarily to use the restroom. She expresses concern that these awakenings may be due to issues with her breathing.  Previously, the patient was on nocturnal oxygen  therapy following an overnight oximetry test in March of the previous year, which showed two hours of oxygen  levels less than 88%. However, the patient returned the oxygen  equipment a few months after the test, following a hospital stay and subsequent office visit. She reports that she believes she slept better while on the oxygen  therapy.  During the day, the patient does not report any significant breathing difficulties and checks her oxygen  levels frequently, particularly during activities such as walking to the mailbox. She has not noticed any significant drops in her oxygen  levels during these checks. The patient denies any active cough or bronchitis symptoms.  The patient also reports poor sleep quality, often not falling asleep until dawn. She denies any daytime fatigue and schedules her appointments in the afternoon due to her late sleep  schedule. She expresses willingness to undergo a sleep study to assess for possible sleep apnea.      No Known Allergies  Immunization History  Administered Date(s) Administered   Fluad Quad(high Dose 65+) 03/21/2020, 03/01/2022   Influenza Split 03/01/2015   PFIZER(Purple Top)SARS-COV-2 Vaccination 07/04/2019, 08/11/2019    PNEUMOCOCCAL CONJUGATE-20 03/01/2022   Pneumococcal Polysaccharide-23 10/14/2017    Past Medical History:  Diagnosis Date   Allergy    Bone spur 04/2021   spine   COPD (chronic obstructive pulmonary disease) (HCC)    per pt told it was seen on an xray from an ER visit, not being treated   Heart murmur 2024   Normal Valves on Echo --likely related to aortic sclerosis.   Hypertension    Sciatica 04/2021   Scoliosis 04/2021    Tobacco History: Social History   Tobacco Use  Smoking Status Every Day   Current packs/day: 0.25   Average packs/day: 0.3 packs/day for 50.0 years (12.5 ttl pk-yrs)   Types: Cigarettes  Smokeless Tobacco Never  Tobacco Comments   Currently smoking 0.5 ppd   1-800-QUIT-NOW (1-917-744-7264).   [email or web address]            1-800-QUIT-NOW 331-015-7210).   [email or web address]      Ready to quit: No Counseling given: Yes Tobacco comments: Currently smoking 0.5 ppd 1-800-QUIT-NOW (1-917-744-7264). [email or web address]    1-800-QUIT-NOW (507) 741-0395). [email or web address]    Outpatient Medications Prior to Visit  Medication Sig Dispense Refill   albuterol  (VENTOLIN  HFA) 108 (90 Base) MCG/ACT inhaler INHALE TWO puffs into THE lungs EVERY SIX HOURS AS NEEDED 6.7 g 3   amLODipine  (NORVASC ) 2.5 MG tablet Take 1 tablet (2.5 mg total) by mouth daily. 90 tablet 3   cholecalciferol (VITAMIN D3) 25 MCG (1000 UNIT) tablet Take 1,000 Units by mouth daily.     dicyclomine  (BENTYL ) 20 MG tablet TAKE ONE TABLET BY MOUTH EVERY SIX HOURS AS NEEDED SPASMS 15 tablet 3   docusate sodium  (COLACE) 50 MG capsule Take 50 mg by mouth 2 (two) times daily as needed for mild constipation.     guaiFENesin -dextromethorphan (ROBITUSSIN DM) 100-10 MG/5ML syrup Take 5 mLs by mouth every 4 (four) hours as needed for cough. 118 mL 0   Iron-Vitamins (GERITOL COMPLETE PO) Take 1 tablet by mouth daily.     megestrol  (MEGACE ) 40 MG/ML  suspension TAKE 10 ML BY MOUTH EVERY DAY 240 mL 0   metoprolol  tartrate (LOPRESSOR ) 25 MG tablet Take 1 tablet (25 mg total) by mouth 2 (two) times daily. 180 tablet 3   TRELEGY ELLIPTA  100-62.5-25 MCG/ACT AEPB Inhale 1 puff into the lungs daily. Return in about 6 months (around 12/29/2022) for F/U with Sarah. 60 each 3   amLODipine  (NORVASC ) 5 MG tablet Take 5 mg by mouth daily. (Patient not taking: Reported on 05/12/2023)     No facility-administered medications prior to visit.    Review of Systems  Review of Systems  Constitutional: Negative.   Respiratory: Negative.    Psychiatric/Behavioral:  Positive for sleep disturbance.    Physical Exam  BP (!) 160/83 (BP Location: Left Arm, Patient Position: Sitting, Cuff Size: Normal)   Pulse 73   Temp (!) 96.9 F (36.1 C) (Temporal)   Ht 5' 5.5 (1.664 m)   Wt 154 lb 12.8 oz (70.2 kg)   SpO2 93%   BMI 25.37 kg/m  Physical Exam Constitutional:      General: She is not in acute distress.    Appearance: Normal appearance. She is not  ill-appearing.  HENT:     Head: Normocephalic and atraumatic.  Cardiovascular:     Rate and Rhythm: Normal rate and regular rhythm.  Pulmonary:     Effort: Pulmonary effort is normal.     Breath sounds: Normal breath sounds. No wheezing or rhonchi.  Skin:    General: Skin is warm and dry.  Neurological:     General: No focal deficit present.     Mental Status: She is alert and oriented to person, place, and time. Mental status is at baseline.  Psychiatric:        Mood and Affect: Mood normal.        Behavior: Behavior normal.        Thought Content: Thought content normal.        Judgment: Judgment normal.      Lab Results:  CBC    Component Value Date/Time   WBC 6.8 04/25/2022 0129   RBC 3.58 (L) 04/25/2022 0129   HGB 10.9 (L) 04/25/2022 0129   HCT 31.8 (L) 04/25/2022 0129   PLT 169 04/25/2022 0129   MCV 88.8 04/25/2022 0129   MCH 30.4 04/25/2022 0129   MCHC 34.3 04/25/2022 0129   RDW  13.2 04/25/2022 0129   LYMPHSABS 1,931 12/10/2019 1450   EOSABS 143 12/10/2019 1450   BASOSABS 52 12/10/2019 1450    BMET    Component Value Date/Time   NA 139 02/28/2023 0817   NA 141 05/21/2022 1459   K 4.1 02/28/2023 0817   CL 107 02/28/2023 0817   CO2 24 02/28/2023 0817   GLUCOSE 79 02/28/2023 0817   BUN 15 02/28/2023 0817   BUN 17 05/21/2022 1459   CREATININE 1.44 (H) 02/28/2023 0817   CREATININE 1.45 (H) 12/10/2019 1450   CALCIUM 10.4 02/28/2023 0817   GFRNONAA 38 (L) 04/25/2022 0129   GFRNONAA 36 (L) 12/10/2019 1450   GFRAA 42 (L) 12/10/2019 1450    BNP    Component Value Date/Time   BNP 13.4 04/23/2022 1715    ProBNP No results found for: PROBNP  Imaging: No results found.   Assessment & Plan:   1. Nocturnal hypoxia (Primary) - Home sleep test; Future  2. Sleep disturbance - Home sleep test; Future     Nocturnal Hypoxemia Previously on nocturnal oxygen , ONO in March 2024 showed patient spent >2 hours with SpO2 <88% and was advised to continue oxygen . Patient stopped using oxygen  without retesting. Currently experiencing sleep disturbances -Order home sleep study to assess for sleep apnea as a potential cause of nocturnal hypoxemia. -If sleep apnea is not present, resume nocturnal oxygen .  COPD with Moderate to Severe Emphysema No current symptoms of bronchitis. Patient self-monitors oxygen  saturation during the day with no reported desaturations. -Continue current management with Trelegy Ellipta    Frequent Nocturia Waking up 3-4 times per night to use the restroom. -Consider evaluation for causes of nocturia if it continues to disrupt sleep after addressing potential sleep apnea and nocturnal hypoxemia.  Difficulty Accessing MyChart Patient has difficulty accessing messages and visit summaries on MyChart. -Assist patient with MyChart access during this visit.      Virginia LELON Ferrari, NP 05/12/2023

## 2023-05-22 DIAGNOSIS — G479 Sleep disorder, unspecified: Secondary | ICD-10-CM

## 2023-05-22 DIAGNOSIS — G4734 Idiopathic sleep related nonobstructive alveolar hypoventilation: Secondary | ICD-10-CM

## 2023-05-29 ENCOUNTER — Ambulatory Visit: Payer: 59 | Admitting: Nurse Practitioner

## 2023-05-29 ENCOUNTER — Encounter: Payer: Self-pay | Admitting: Nurse Practitioner

## 2023-05-29 VITALS — BP 140/82 | HR 75 | Temp 97.7°F | Ht 65.5 in | Wt 153.1 lb

## 2023-05-29 DIAGNOSIS — Z0001 Encounter for general adult medical examination with abnormal findings: Secondary | ICD-10-CM

## 2023-05-29 DIAGNOSIS — J019 Acute sinusitis, unspecified: Secondary | ICD-10-CM | POA: Diagnosis not present

## 2023-05-29 DIAGNOSIS — R319 Hematuria, unspecified: Secondary | ICD-10-CM

## 2023-05-29 DIAGNOSIS — Z1239 Encounter for other screening for malignant neoplasm of breast: Secondary | ICD-10-CM | POA: Diagnosis not present

## 2023-05-29 DIAGNOSIS — I10 Essential (primary) hypertension: Secondary | ICD-10-CM | POA: Diagnosis not present

## 2023-05-29 DIAGNOSIS — R634 Abnormal weight loss: Secondary | ICD-10-CM

## 2023-05-29 DIAGNOSIS — Z23 Encounter for immunization: Secondary | ICD-10-CM | POA: Diagnosis not present

## 2023-05-29 DIAGNOSIS — N1832 Chronic kidney disease, stage 3b: Secondary | ICD-10-CM

## 2023-05-29 LAB — URINALYSIS, ROUTINE W REFLEX MICROSCOPIC
Bilirubin Urine: NEGATIVE
Ketones, ur: NEGATIVE
Nitrite: NEGATIVE
Specific Gravity, Urine: 1.015 (ref 1.000–1.030)
Total Protein, Urine: NEGATIVE
Urine Glucose: NEGATIVE
Urobilinogen, UA: 0.2 (ref 0.0–1.0)
pH: 6 (ref 5.0–8.0)

## 2023-05-29 LAB — LIPID PANEL
Cholesterol: 140 mg/dL (ref 0–200)
HDL: 32.8 mg/dL — ABNORMAL LOW (ref >39.00)
LDL Cholesterol: 91 mg/dL (ref 0–99)
NonHDL: 107.67
Total CHOL/HDL Ratio: 4
Triglycerides: 83 mg/dL (ref 0.0–149.0)
VLDL: 16.6 mg/dL (ref 0.0–40.0)

## 2023-05-29 LAB — COMPREHENSIVE METABOLIC PANEL
ALT: 8 U/L (ref 0–35)
AST: 14 U/L (ref 0–37)
Albumin: 4.2 g/dL (ref 3.5–5.2)
Alkaline Phosphatase: 67 U/L (ref 39–117)
BUN: 17 mg/dL (ref 6–23)
CO2: 23 meq/L (ref 19–32)
Calcium: 10.6 mg/dL — ABNORMAL HIGH (ref 8.4–10.5)
Chloride: 107 meq/L (ref 96–112)
Creatinine, Ser: 1.43 mg/dL — ABNORMAL HIGH (ref 0.40–1.20)
GFR: 35.96 mL/min — ABNORMAL LOW (ref >60.00)
Glucose, Bld: 75 mg/dL (ref 70–99)
Potassium: 3.7 meq/L (ref 3.5–5.1)
Sodium: 138 meq/L (ref 135–145)
Total Bilirubin: 0.7 mg/dL (ref 0.2–1.2)
Total Protein: 7.4 g/dL (ref 6.0–8.3)

## 2023-05-29 LAB — HEMOGLOBIN A1C: Hgb A1c MFr Bld: 5.9 % (ref 4.6–6.5)

## 2023-05-29 LAB — TSH: TSH: 1.83 u[IU]/mL (ref 0.35–5.50)

## 2023-05-29 LAB — CBC
HCT: 39.4 % (ref 36.0–46.0)
Hemoglobin: 13.3 g/dL (ref 12.0–15.0)
MCHC: 33.7 g/dL (ref 30.0–36.0)
MCV: 93.3 fL (ref 78.0–100.0)
Platelets: 187 10*3/uL (ref 150.0–400.0)
RBC: 4.22 Mil/uL (ref 3.87–5.11)
RDW: 14 % (ref 11.5–15.5)
WBC: 7.9 10*3/uL (ref 4.0–10.5)

## 2023-05-29 MED ORDER — AZITHROMYCIN 250 MG PO TABS: ORAL_TABLET | ORAL | 0 refills | Status: AC

## 2023-05-29 MED ORDER — AMLODIPINE BESYLATE 5 MG PO TABS: 5.0000 mg | ORAL_TABLET | Freq: Every day | ORAL | 1 refills | Status: DC

## 2023-05-29 NOTE — Patient Instructions (Signed)
Nepro

## 2023-05-29 NOTE — Assessment & Plan Note (Signed)
Urine testing ordered today, consider referral to urology based on results

## 2023-05-29 NOTE — Assessment & Plan Note (Signed)
Flu shot ministered today, VIS provided Healthy lifestyle discussed and handout provided Will order screening mammogram today Patient encouraged to call GI to schedule consultation regarding possibly undergoing colon cancer screening

## 2023-05-29 NOTE — Assessment & Plan Note (Signed)
Chronic, blood pressure above goal Pressure decision-making increase amlodipine to 5 mg daily.  Also continue on metoprolol 25 mg twice daily.  Follow-up in 6 weeks to monitor blood pressure.

## 2023-05-29 NOTE — Assessment & Plan Note (Signed)
Screening mammogram ordered, further recommendations may be made based upon these results.

## 2023-05-29 NOTE — Progress Notes (Signed)
Complete physical exam  Patient: Virginia Watson   DOB: [birth year]   78 y.o. Female  MRN: [id number]  Subjective:    Chief Complaint  Patient presents with   Annual Exam    Virginia Watson is a 78 y.o. female who presents today for a complete physical exam. She reports consuming a general diet.  Exercise: none . She does have additional problems to discuss today.   Hypertension: Blood pressure has been consistently above goal.  Currently she is on amlodipine 2.5 mg daily and metoprolol 25 mg twice daily.   Hematuria/CKD 3B: She does have CKD stage IIIb.  Follows with nephrology.  Per chart review I do see where she has had hematuria and leukocytes in urine without UTI.  GFR Baseline is around mid 40s.  Sinusitis: Reports that for 2 weeks has been having sinus pressure and pain.  No fever, but reports she feels that she has a sinus infection.  She is also experiencing congestion.  Health maintenance: She has not had flu vaccine would like this administered today.  Also due for colon cancer screening, feels comfortable calling her previous gastroenterologist to discuss this.  Has not had a mammogram completed in about 2 years.   Unintentional weight loss: Is prescribed Megace, weight continues to decline.  Patient does report today she is not taking Megace on a regular basis.   Most recent fall risk assessment:    05/29/2023    1:21 PM  Fall Risk   Falls in the past year? 0  Number falls in past yr: 0  Injury with Fall? 0  Risk for fall due to : No Fall Risks  Follow up Falls evaluation completed     Most recent depression screenings:    05/29/2023    1:21 PM 06/28/2022    1:52 PM  PHQ 2/9 Scores  PHQ - 2 Score 0 0  PHQ- 9 Score  0      Past Medical History:  Diagnosis Date   Allergy    Bone spur 04/2021   spine   COPD (chronic obstructive pulmonary disease) (HCC)    per pt told it was seen on an xray from an ER visit, not being treated   Heart  murmur 2024   Normal Valves on Echo --likely related to aortic sclerosis.   Hypertension    Sciatica 04/2021   Scoliosis 04/2021      Patient Care Team: Elenore Paddy, NP as PCP - General (Nurse Practitioner) Marykay Lex, MD as PCP - Cardiology (Cardiology)   Outpatient Medications Prior to Visit  Medication Sig   albuterol (VENTOLIN HFA) 108 (90 Base) MCG/ACT inhaler INHALE TWO puffs into THE lungs EVERY SIX HOURS AS NEEDED   cholecalciferol (VITAMIN D3) 25 MCG (1000 UNIT) tablet Take 1,000 Units by mouth daily.   dicyclomine (BENTYL) 20 MG tablet TAKE ONE TABLET BY MOUTH EVERY SIX HOURS AS NEEDED SPASMS   docusate sodium (COLACE) 50 MG capsule Take 50 mg by mouth 2 (two) times daily as needed for mild constipation.   guaiFENesin-dextromethorphan (ROBITUSSIN DM) 100-10 MG/5ML syrup Take 5 mLs by mouth every 4 (four) hours as needed for cough.   Iron-Vitamins (GERITOL COMPLETE PO) Take 1 tablet by mouth daily.   megestrol (MEGACE) 40 MG/ML suspension TAKE 10 ML BY MOUTH EVERY DAY   metoprolol tartrate (LOPRESSOR) 25 MG tablet Take 1 tablet (25 mg total) by mouth 2 (two) times daily.   TRELEGY ELLIPTA 100-62.5-25 MCG/ACT AEPB Inhale  1 puff into the lungs daily. Return in about 6 months (around 12/29/2022) for F/U with Tedford Berg.   [DISCONTINUED] amLODipine (NORVASC) 2.5 MG tablet Take 1 tablet (2.5 mg total) by mouth daily.   No facility-administered medications prior to visit.    Review of Systems  Constitutional:  Negative for chills and fever.  HENT:  Positive for congestion and sinus pain.   Eyes:  Negative for blurred vision and double vision.  Respiratory:  Positive for cough. Negative for shortness of breath and wheezing.   Cardiovascular:  Negative for chest pain and palpitations.  Gastrointestinal:  Negative for abdominal pain and blood in stool.  Genitourinary:  Negative for dysuria and hematuria.  Neurological:  Negative for seizures and loss of consciousness.           Objective:     BP (!) 140/82   Pulse 75   Temp 97.7 F (36.5 C) (Temporal)   Ht 5' 5.5" (1.664 m)   Wt 153 lb 2 oz (69.5 kg)   SpO2 95%   BMI 25.09 kg/m  BP Readings from Last 3 Encounters:  05/29/23 (!) 140/82  05/12/23 (!) 160/83  03/05/23 (!) 151/93   Wt Readings from Last 3 Encounters:  05/29/23 153 lb 2 oz (69.5 kg)  05/12/23 154 lb 12.8 oz (70.2 kg)  02/28/23 156 lb (70.8 kg)      Physical Exam Vitals reviewed.  Constitutional:      Appearance: Normal appearance.  HENT:     Head: Normocephalic and atraumatic.     Right Ear: Tympanic membrane, ear canal and external ear normal.     Left Ear: Tympanic membrane, ear canal and external ear normal.  Eyes:     General:        Right eye: No discharge.        Left eye: No discharge.     Extraocular Movements: Extraocular movements intact.     Conjunctiva/sclera: Conjunctivae normal.     Pupils: Pupils are equal, round, and reactive to light.  Neck:     Vascular: No carotid bruit.  Cardiovascular:     Rate and Rhythm: Normal rate and regular rhythm.     Pulses: Normal pulses.     Heart sounds: Normal heart sounds. No murmur heard. Pulmonary:     Effort: Pulmonary effort is normal.     Breath sounds: Normal breath sounds.  Chest:     Comments: Deferred per patient preference Abdominal:     General: Abdomen is flat. Bowel sounds are normal. There is no distension.     Palpations: Abdomen is soft. There is no mass.     Tenderness: There is no abdominal tenderness.  Musculoskeletal:        General: No tenderness.     Cervical back: Neck supple. No muscular tenderness.     Right lower leg: No edema.     Left lower leg: No edema.  Lymphadenopathy:     Cervical: No cervical adenopathy.  Skin:    General: Skin is warm and dry.  Neurological:     General: No focal deficit present.     Mental Status: She is alert and oriented to person, place, and time.     Motor: No weakness.     Gait: Gait  normal.  Psychiatric:        Mood and Affect: Mood normal.        Behavior: Behavior normal.        Judgment: Judgment normal.  No results found for any visits on 05/29/23.     Assessment & Plan:    Routine Health Maintenance and Physical Exam  Immunization History  Administered Date(s) Administered   Fluad Quad(high Dose 65+) 03/21/2020, 03/01/2022   Influenza Split 03/01/2015   PFIZER(Purple Top)SARS-COV-2 Vaccination 07/04/2019, 08/11/2019   PNEUMOCOCCAL CONJUGATE-20 03/01/2022   Pneumococcal Polysaccharide-23 10/14/2017    Health Maintenance  Topic Date Due   DTaP/Tdap/Td (1 - Tdap) Never done   Zoster Vaccines- Shingrix (1 of 2) Never done   INFLUENZA VACCINE  11/28/2022   Colonoscopy  02/09/2023   Medicare Annual Wellness (AWV)  03/29/2023   Pneumonia Vaccine 42+ Years old  Completed   DEXA SCAN  Completed   Hepatitis C Screening  Completed   HPV VACCINES  Aged Out   Lung Cancer Screening  Discontinued   COVID-19 Vaccine  Discontinued    Discussed health benefits of physical activity, and encouraged her to engage in regular exercise appropriate for her age and condition.  Problem List Items Addressed This Visit       Cardiovascular and Mediastinum   Essential hypertension (Chronic)   Chronic, blood pressure above goal Pressure decision-making increase amlodipine to 5 mg daily.  Also continue on metoprolol 25 mg twice daily.  Follow-up in 6 weeks to monitor blood pressure.      Relevant Medications   amLODipine (NORVASC) 5 MG tablet   Other Relevant Orders   CBC   Comprehensive metabolic panel   Hemoglobin A1c   Lipid panel   TSH     Respiratory   Acute non-recurrent sinusitis   Treat with course of azithromycin.  Follow-up if symptoms do not improve.      Relevant Medications   azithromycin (ZITHROMAX) 250 MG tablet   Other Relevant Orders   CBC   Comprehensive metabolic panel   Hemoglobin A1c   Lipid panel   TSH     Genitourinary    Stage 3b chronic kidney disease (HCC) (Chronic)   Chronic Continue to follow-up with nephrology as scheduled. Will check urine testing for evidence of UTI, if she continues to have leukocytes in urine as well as microscopic hematuria without evidence of UTI would recommend evaluation with urology.      Relevant Orders   Amb ref to Medical Nutrition Therapy-MNT   CBC   Comprehensive metabolic panel   Hemoglobin A1c   Lipid panel   TSH     Other   Need for vaccination   Relevant Orders   Flu Vaccine Trivalent High Dose (Fluad)   Unintentional weight loss   Chronic We discussed Megace and significant side effects especially blood clots.  I recommended that she try taking this regularly for a few weeks, if we do not see improvement or at least stabilization in weight we should discontinue this medication as at that point I will have not shown much benefit and I think continuing the medication is unnecessary considering the significant risks.  Patient reports understanding.  Follow-up in 6 weeks. Also encouraged her to consider meal supplementation, she could try Nepro.  She will consider this.      Relevant Orders   Amb ref to Medical Nutrition Therapy-MNT   CBC   Comprehensive metabolic panel   Hemoglobin A1c   Lipid panel   TSH   Hematuria - Primary   Urine testing ordered today, consider referral to urology based on results      Relevant Orders   Urine Culture   Urinalysis, Routine  w reflex microscopic   CBC   Comprehensive metabolic panel   Hemoglobin A1c   Lipid panel   TSH   Encounter for screening for malignant neoplasm of breast   Screening mammogram ordered, further recommendations may be made based upon these results.      Relevant Orders   MM 3D SCREENING MAMMOGRAM BILATERAL BREAST   CBC   Comprehensive metabolic panel   Hemoglobin A1c   Lipid panel   TSH   Encounter for general adult medical examination with abnormal findings   Flu shot ministered today,  VIS provided Healthy lifestyle discussed and handout provided Will order screening mammogram today Patient encouraged to call GI to schedule consultation regarding possibly undergoing colon cancer screening      Relevant Orders   CBC   Comprehensive metabolic panel   Hemoglobin A1c   Lipid panel   TSH   Return in about 6 weeks (around 07/10/2023) for F/U with Cassondra Stachowski.  In addition to her annual physical exam also addressed in office visit as detailed above.     Elenore Paddy, NP

## 2023-05-29 NOTE — Assessment & Plan Note (Signed)
Treat with course of azithromycin.  Follow-up if symptoms do not improve.

## 2023-05-29 NOTE — Assessment & Plan Note (Signed)
Chronic Continue to follow-up with nephrology as scheduled. Will check urine testing for evidence of UTI, if she continues to have leukocytes in urine as well as microscopic hematuria without evidence of UTI would recommend evaluation with urology.

## 2023-05-29 NOTE — Assessment & Plan Note (Signed)
Chronic We discussed Megace and significant side effects especially blood clots.  I recommended that she try taking this regularly for a few weeks, if we do not see improvement or at least stabilization in weight we should discontinue this medication as at that point I will have not shown much benefit and I think continuing the medication is unnecessary considering the significant risks.  Patient reports understanding.  Follow-up in 6 weeks. Also encouraged her to consider meal supplementation, she could try Nepro.  She will consider this.

## 2023-05-30 LAB — URINE CULTURE

## 2023-06-04 ENCOUNTER — Other Ambulatory Visit: Payer: Self-pay | Admitting: Nurse Practitioner

## 2023-06-04 ENCOUNTER — Encounter: Payer: Self-pay | Admitting: Nurse Practitioner

## 2023-06-04 DIAGNOSIS — R319 Hematuria, unspecified: Secondary | ICD-10-CM

## 2023-06-20 ENCOUNTER — Other Ambulatory Visit: Payer: Self-pay | Admitting: Nurse Practitioner

## 2023-06-20 DIAGNOSIS — R634 Abnormal weight loss: Secondary | ICD-10-CM

## 2023-06-21 ENCOUNTER — Telehealth: Payer: Self-pay | Admitting: Pulmonary Disease

## 2023-06-21 DIAGNOSIS — G479 Sleep disorder, unspecified: Secondary | ICD-10-CM

## 2023-06-21 DIAGNOSIS — G4733 Obstructive sleep apnea (adult) (pediatric): Secondary | ICD-10-CM

## 2023-06-21 NOTE — Telephone Encounter (Signed)
 Call patient  Sleep study result  Date of study: 05/22/2023  Impression: Moderate obstructive sleep apnea with moderate oxygen desaturations.  AHI of 16.8  Recommendation: DME referral  Recommend CPAP therapy for moderate obstructive sleep apnea  Auto titrating CPAP with pressure settings of 5-15 will be appropriate  Encourage weight loss measures  Follow-up in the office 4 to 6 weeks following initiation of treatment

## 2023-06-25 ENCOUNTER — Ambulatory Visit: Payer: 59

## 2023-06-26 ENCOUNTER — Telehealth: Payer: Self-pay | Admitting: Nurse Practitioner

## 2023-06-26 NOTE — Telephone Encounter (Signed)
 Copied from CRM [id number]. Topic: Referral - Question >> Jun 26, 2023 12:44 PM Sim Boast F wrote: Reason for CRM: Patient request call back, wants to know why she is being referred to Neurology? I don't see a referral to Neurology in patient chart

## 2023-07-01 ENCOUNTER — Encounter: Payer: Self-pay | Admitting: *Deleted

## 2023-07-01 NOTE — Telephone Encounter (Signed)
 ATC patient x1.  LVM to return call.  Sent mychart message as well.

## 2023-07-02 NOTE — Telephone Encounter (Signed)
 Called pt and made her aware of no referral for Neurology, pt clarify that it the urology she was talking about. I let pt be aware that this referral was done for UTI symptoms she was experiencing during her office visit on the 05/29/23

## 2023-07-03 NOTE — Telephone Encounter (Signed)
 Patient is returning missed call in regards to her sleep study results.

## 2023-07-04 ENCOUNTER — Other Ambulatory Visit: Payer: Self-pay | Admitting: Nurse Practitioner

## 2023-07-04 DIAGNOSIS — R634 Abnormal weight loss: Secondary | ICD-10-CM

## 2023-07-04 NOTE — Telephone Encounter (Signed)
 I called and spoke with pt. Pt informed of sleep study results. Pt verbalized understanding. CPAP order sent. NFN

## 2023-07-07 ENCOUNTER — Other Ambulatory Visit: Payer: Self-pay | Admitting: Nurse Practitioner

## 2023-07-07 DIAGNOSIS — J449 Chronic obstructive pulmonary disease, unspecified: Secondary | ICD-10-CM

## 2023-07-07 DIAGNOSIS — R634 Abnormal weight loss: Secondary | ICD-10-CM

## 2023-07-09 ENCOUNTER — Telehealth: Payer: Self-pay | Admitting: Pulmonary Disease

## 2023-07-10 ENCOUNTER — Ambulatory Visit (INDEPENDENT_AMBULATORY_CARE_PROVIDER_SITE_OTHER): Payer: 59 | Admitting: Nurse Practitioner

## 2023-07-10 VITALS — BP 136/84 | HR 80 | Temp 97.9°F | Ht 65.5 in | Wt 158.1 lb

## 2023-07-10 DIAGNOSIS — G4700 Insomnia, unspecified: Secondary | ICD-10-CM | POA: Diagnosis not present

## 2023-07-10 DIAGNOSIS — R634 Abnormal weight loss: Secondary | ICD-10-CM

## 2023-07-10 DIAGNOSIS — I10 Essential (primary) hypertension: Secondary | ICD-10-CM | POA: Diagnosis not present

## 2023-07-10 NOTE — Assessment & Plan Note (Signed)
 Chronic, improved Blood pressure improved with increase of amlodipine 5 mg daily.  Continue this dose also continue metoprolol 25 mg twice daily.

## 2023-07-10 NOTE — Assessment & Plan Note (Signed)
 Chronic Likely related to her untreated sleep apnea. We discussed medication options but I highly recommend she try CPAP for a few weeks as this will likely result in much improved sleep when she gets used to the mask.  She reports her understanding.  Follow-up in about 2 months to see how she is tolerating CPAP and to further discuss medication for insomnia if needed.

## 2023-07-10 NOTE — Progress Notes (Signed)
 Established Patient Office Visit  Subjective   Patient ID: Virginia Watson, female    DOB: 02-05-1946  Age: 78 y.o. MRN: [id number]  Chief Complaint  Patient presents with   Insomnia    Insomnia: Has been diagnosed with sleep apnea, waiting on getting her CPAP.  Reports she has tried to take Benadryl, NyQuil, melatonin, other over-the-counter sleep aids without improvement.  Reports she will sleep for about 2 hours and she will be up for 3 to 4 hours before she can fall asleep again.  Does not feel fully rested upon waking up.  Would like to take medication to help her with sleep.  Hypertension: At last office visit amlodipine was increased to 5 mg by mouth daily.  She is tolerating this dosage increase without side effects.  She also continues on metoprolol 25 mg twice daily.  Unintentional weight loss: Continues to take Megace on a fairly regular basis.  Since last office visit she reports she has noticed more improvement with taking this regularly, she would like to continue the medication.  We have had shared decision-making discussion regarding potential side effects and risks and she reports that she continues to be aware of these risks.    Review of Systems  Cardiovascular:  Negative for chest pain and leg swelling.      Objective:     BP 136/84   Pulse 80   Temp 97.9 F (36.6 C) (Temporal)   Ht 5' 5.5" (1.664 m)   Wt 158 lb 2 oz (71.7 kg)   SpO2 94%   BMI 25.91 kg/m  BP Readings from Last 3 Encounters:  07/10/23 136/84  05/29/23 (!) 140/82  05/12/23 (!) 160/83   Wt Readings from Last 3 Encounters:  07/10/23 158 lb 2 oz (71.7 kg)  05/29/23 153 lb 2 oz (69.5 kg)  05/12/23 154 lb 12.8 oz (70.2 kg)      Physical Exam Vitals reviewed.  Constitutional:      General: She is not in acute distress.    Appearance: Normal appearance.  HENT:     Head: Normocephalic and atraumatic.  Neck:     Vascular: No carotid bruit.  Cardiovascular:     Rate and  Rhythm: Normal rate and regular rhythm.     Pulses: Normal pulses.     Heart sounds: Normal heart sounds.  Pulmonary:     Effort: Pulmonary effort is normal.     Breath sounds: Normal breath sounds.  Skin:    General: Skin is warm and dry.  Neurological:     General: No focal deficit present.     Mental Status: She is alert and oriented to person, place, and time.  Psychiatric:        Mood and Affect: Mood normal.        Behavior: Behavior normal.        Judgment: Judgment normal.      No results found for any visits on 07/10/23.    The 10-year ASCVD risk score (Arnett DK, et al., 2019) is: 19.1%    Assessment & Plan:   Problem List Items Addressed This Visit       Cardiovascular and Mediastinum   Essential hypertension (Chronic)   Chronic, improved Blood pressure improved with increase of amlodipine 5 mg daily.  Continue this dose also continue metoprolol 25 mg twice daily.        Other   Unintentional weight loss   Chronic,  Patient has gained about 5 pounds since  last office visit.  We again discussed risk versus benefits and patient would like to continue on Megace to assist with appetite maintenance and maintaining weight.  I am agreeable to this for now.      Insomnia - Primary   Chronic Likely related to her untreated sleep apnea. We discussed medication options but I highly recommend she try CPAP for a few weeks as this will likely result in much improved sleep when she gets used to the mask.  She reports her understanding.  Follow-up in about 2 months to see how she is tolerating CPAP and to further discuss medication for insomnia if needed.       Return in about 2 months (around 09/09/2023) for F/U with Maralyn Sago.    Elenore Paddy, NP

## 2023-07-10 NOTE — Assessment & Plan Note (Signed)
 Chronic,  Patient has gained about 5 pounds since last office visit.  We again discussed risk versus benefits and patient would like to continue on Megace to assist with appetite maintenance and maintaining weight.  I am agreeable to this for now.

## 2023-07-14 NOTE — Telephone Encounter (Signed)
 fax confirmation received 07/14/23

## 2023-07-29 ENCOUNTER — Ambulatory Visit: Payer: 59 | Admitting: Skilled Nursing Facility1

## 2023-08-01 ENCOUNTER — Encounter: Attending: Nurse Practitioner | Admitting: Dietician

## 2023-08-01 ENCOUNTER — Encounter: Payer: Self-pay | Admitting: Dietician

## 2023-08-01 DIAGNOSIS — N1832 Chronic kidney disease, stage 3b: Secondary | ICD-10-CM | POA: Insufficient documentation

## 2023-08-01 NOTE — Progress Notes (Signed)
 Class 1 of the Lifestyle Change Program   Pt was seen on 08/01/23 for class 1 of 2 of a series of classes on proper nutrition for adults with acute or chronic diease's such as GI issues, Obesity, CKD stages 1-3, HTN, hyperlipidemia, previous Bariatric surgery, etc. The focus of this class series is how to make lifestyle changes, understanding macronutrients, micronutrients, MyPlate, snack creation and meal creation.   Upon completion of this series patients will have the tools to be able to manage their acute/chronic disease through education and diet manipulation:  Stages of Change: Patients readiness to make dietary changes is assessed using the Stages of Change model, which includes Precontemplation, Contemplation, Preparation, Action, and Maintenance. Tailored interventions are provided based on the Patients stage through education.  Calories, Macronutrients, and Micronutrients: Patient education focuses on understanding the role of calories, macronutrients (proteins, fats, and carbohydrates), and micronutrients (vitamins and minerals) in overall health and nutrition.  Balanced Plate Skill Development: Patient is guided in creating meals that incorporate a variety of food groups, emphasizing proper portion sizes and nutrient balance for optimal health.  Nutritious Snack Creation: The patient develops skills to prepare healthy, nutrient-dense snacks to improve energy levels and maintain nutrition balance throughout the day.  Personalized Nutrition Action Plan: A customized plan is developed, incorporating specific, measurable nutrition goals that align with the patients needs, focusing on sustainable behavior change.   Handouts given: SMART goals sheet Detailed MyPlate  Macronutrients and Micronutrients description  Balanced Snacks

## 2023-08-08 ENCOUNTER — Ambulatory Visit: Payer: Self-pay | Admitting: Primary Care

## 2023-08-08 ENCOUNTER — Ambulatory Visit: Admitting: Dietician

## 2023-08-08 DIAGNOSIS — G4733 Obstructive sleep apnea (adult) (pediatric): Secondary | ICD-10-CM

## 2023-08-08 NOTE — Addendum Note (Signed)
 Addended by: Gay Filler T on: 08/08/2023 11:58 AM   Modules accepted: Orders

## 2023-08-08 NOTE — Telephone Encounter (Signed)
  Chief Complaint: info only Additional Notes: Pt calling re: CPAP machine issues. Reportedly called [employer] on Wednesday to troubleshoot with no success, so wanting a new machine. Please advise.   Copied from CRM [id number]. Topic: General - Other >> Aug 08, 2023 10:42 AM Virginia Watson wrote: Reason for CRM: patient cpap machine is not working, patient spoke with [facility] and tried to get it working and its still malfunctioning Reason for Disposition  [1] Caller requesting NON-URGENT health information AND [2] PCP'Watson office is the best resource  Answer Assessment - Initial Assessment Questions 1. REASON FOR CALL or QUESTION: "What is your reason for calling today?" or "How can I best help you?" or "What question do you have that I can help answer?"     CPAP issues - just got it 2 weeks ago. "When I turn it on, I see it go up to 40, then 3-4 seconds later it cuts off" Reports calling [employer] on Wednesday for troubleshooting. Pt was not able to use last night. Pt states "I want to be on it". Pt reports that [facility] instructed her to call PCP to get a new machine.  Pt unable to name manufacturer of CPAP machine.  Pt reports that [employer] rep acknowledged that this particular machine has historically had issues?  Protocols used: Information Only Call - No Triage-A-AH

## 2023-08-08 NOTE — Telephone Encounter (Signed)
 Order for new CPAP machine placed. Pt informed.

## 2023-08-25 ENCOUNTER — Ambulatory Visit: Payer: 59 | Admitting: Internal Medicine

## 2023-08-25 NOTE — Telephone Encounter (Signed)
 Yes, CPAP 5-15cm h20. OV in 31-90 days

## 2023-08-25 NOTE — Telephone Encounter (Signed)
 Copied from CRM [id number]. Topic: General - Other >> Aug 08, 2023 10:42 AM Alverda Joe S wrote: Reason for CRM: patient cpap machine is not working, patient spoke with [facility] and tried to get it working and its still malfunctioning >> Aug 21, 2023 10:25 AM Margarette Shawl wrote: Alana, from [employer] [facility] is reaching out to clinic concerning the new order for CPAP machine. They have attempted to contact patient 3 times and have not gotten any response. She reports patient patient received the defective machine last month, and is unsure if she owns the machine or is renting it. Insurance will not cover the new machine unless she brings the malfunctioning machine to get serviced and it must be deemed "unusable". Requests if we could reach out to patient on our side to give her the information.   CB#  1 [phone] (ask for Alana)  We may need to send a new machine in. Okay to do so?

## 2023-08-26 NOTE — Telephone Encounter (Signed)
 I called and spoke to pt. Pt informed of new CPAP order being placed to [employer]. I informed pt that we need to move up her appointment so she can complete at least 31 days of compliance before Jerlene Moody sees her again. Pt verbalized understanding. CPAP order placed. Routing to front desk to move her May appointment to a bit further. ( Maybe mid June) NFN

## 2023-08-26 NOTE — Addendum Note (Signed)
 Addended by: Anise Kerns T on: 08/26/2023 11:54 AM   Modules accepted: Orders

## 2023-08-28 DIAGNOSIS — G4733 Obstructive sleep apnea (adult) (pediatric): Secondary | ICD-10-CM | POA: Diagnosis not present

## 2023-09-04 ENCOUNTER — Telehealth: Payer: Self-pay

## 2023-09-04 NOTE — Telephone Encounter (Signed)
 Copied from CRM [id number]. Topic: General - Other >> Aug 08, 2023 10:42 AM Virginia Watson wrote: Reason for CRM: patient cpap machine is not working, patient spoke with [facility] and tried to get it working and its still malfunctioning >> Aug 21, 2023 10:25 AM Margarette Shawl wrote: Alana, from [employer] [facility] is reaching out to clinic concerning the new order for CPAP machine. They have attempted to contact patient 3 times and have not gotten any response. She reports patient patient received the defective machine last month, and is unsure if she owns the machine or is renting it. Insurance will not cover the new machine unless she brings the malfunctioning machine to get serviced and it must be deemed "unusable". Requests if we could reach out to patient on our side to give her the information.   CB#  1 [phone] (ask for Alana)   I called and spoke with pt. I informed pt of Alana'Watson message. Pt states [facility] rents it and she has an appointment with [employer] on 09-08-23 to return the machine. Pt asked about her upcoming appointment with Irby Mannan, NP on 10-28-23 for CPAP compliance. I informed pt that she does need 31 days or more of CPAP usage, it can not be less than 31 days. I informed pt that depending on if she needed an new machine or if her current one was fixed, we would keep her appointment for now and move her appointment up if needed. Pt verbalized understanding. NFN  I called [employer] and spoke to Alana. I informed Alana of the information I received from the pt. Alana stated that our office would receive a fax to let us  know if she needed a new CPAP machine or if her current one is fixed. NFN

## 2023-09-06 IMAGING — DX DG LUMBAR SPINE COMPLETE W/ BEND
6 series · 6 of 6 positions shown · non-contrast
Comparison: None.

CLINICAL DATA: Back pain, bilateral hip pain

EXAM:
LUMBAR SPINE - COMPLETE WITH BENDING VIEWS

[view not recorded (1 of 3)]
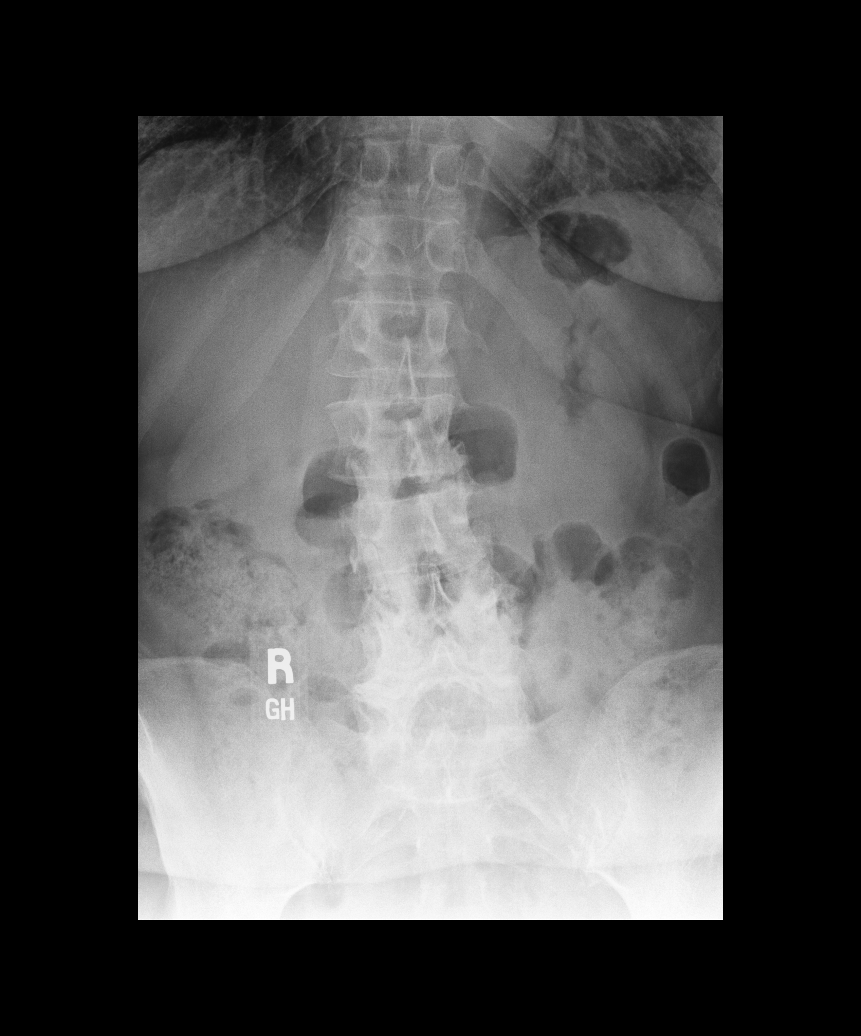

[view not recorded (2 of 3)]
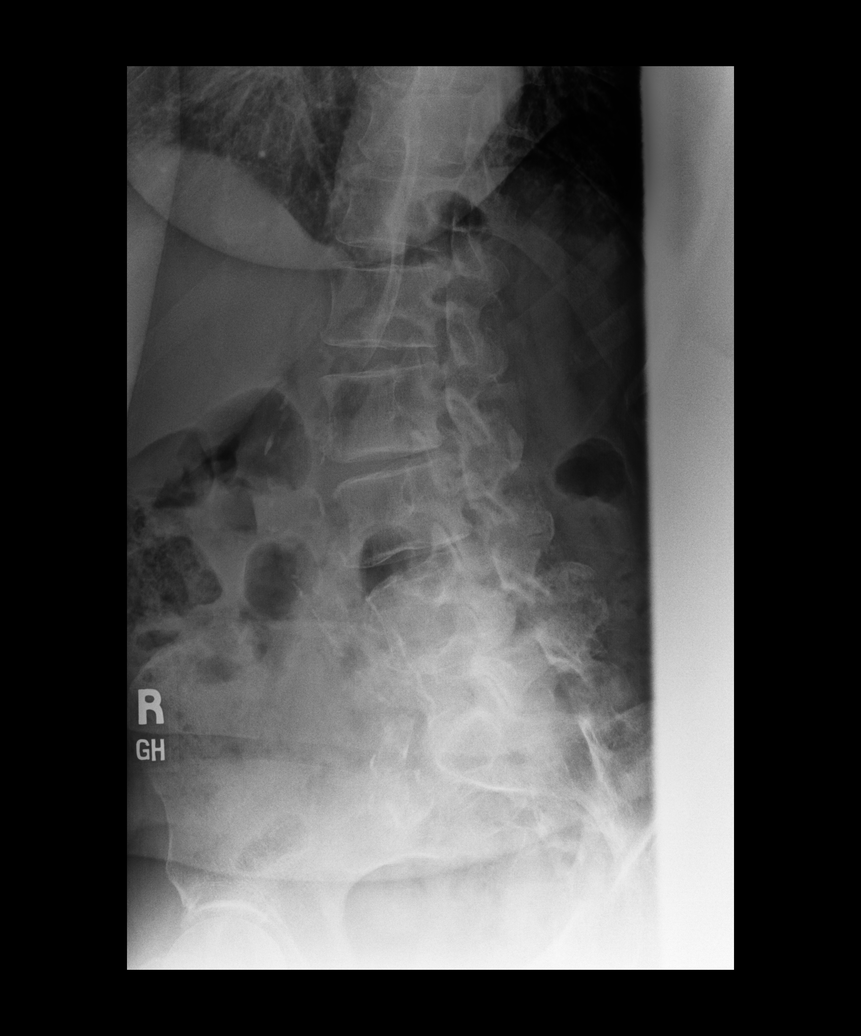

[view not recorded (3 of 3)]
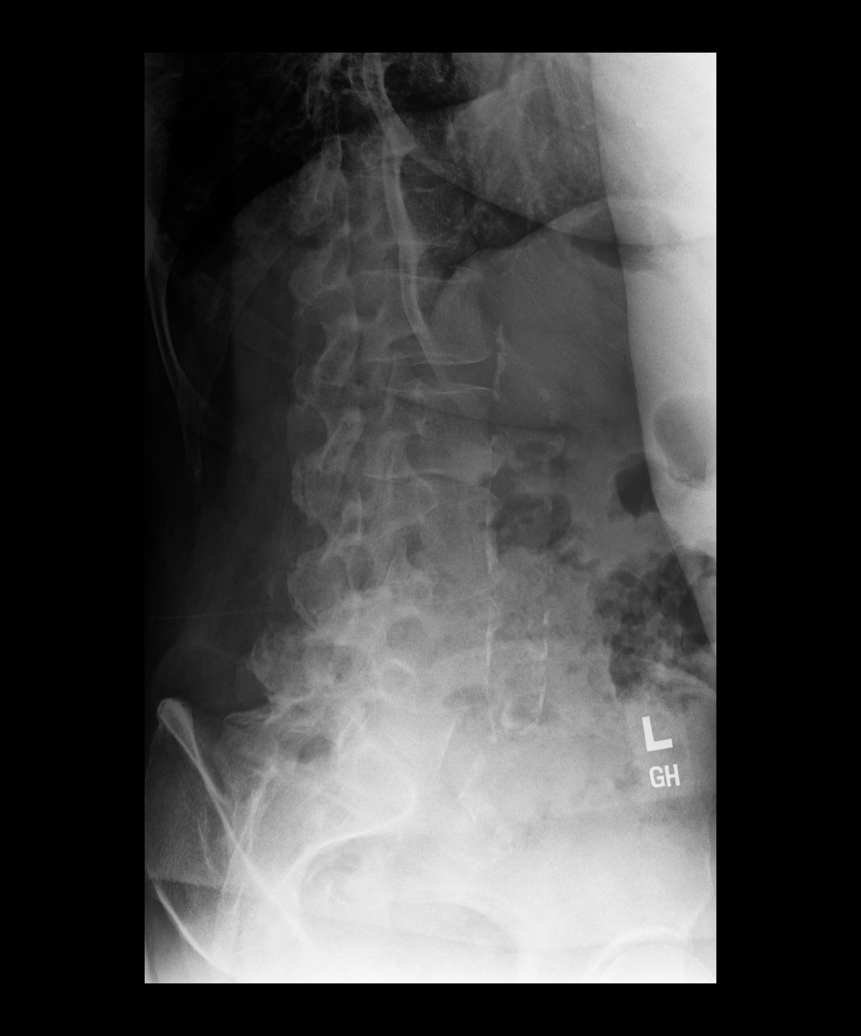

[dg lumbar spine complete w/bend 6+v (1 of 3)]
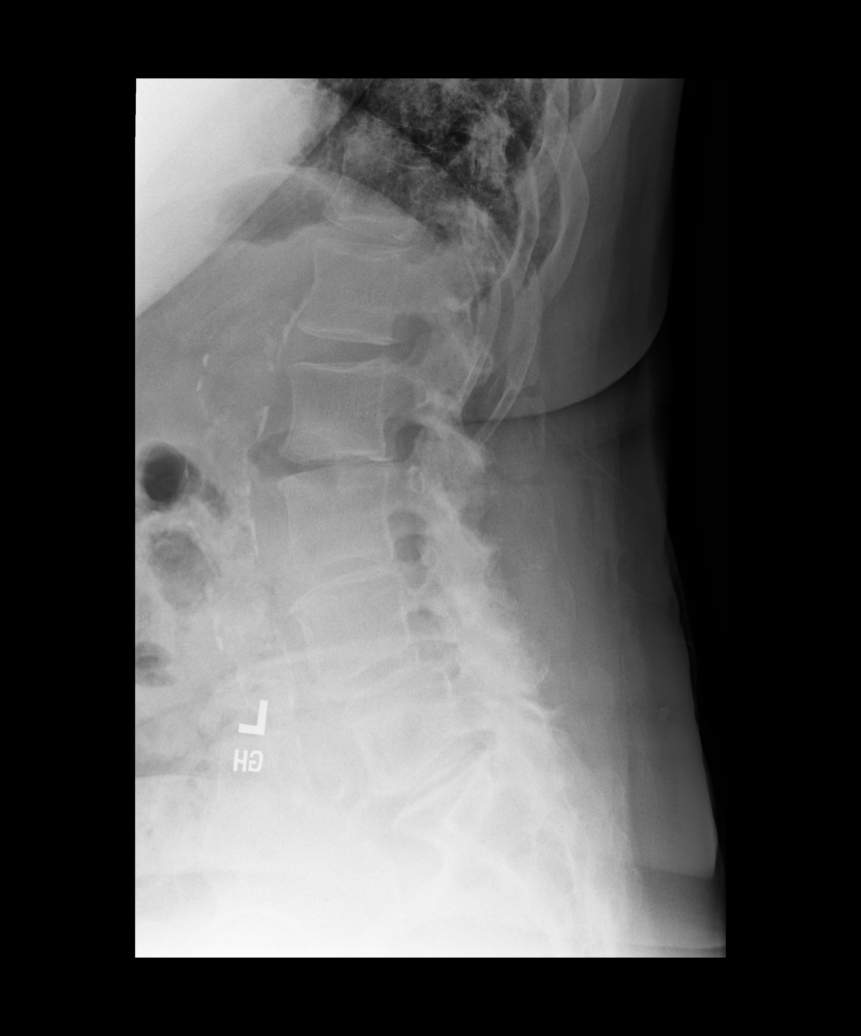

[dg lumbar spine complete w/bend 6+v (2 of 3)]
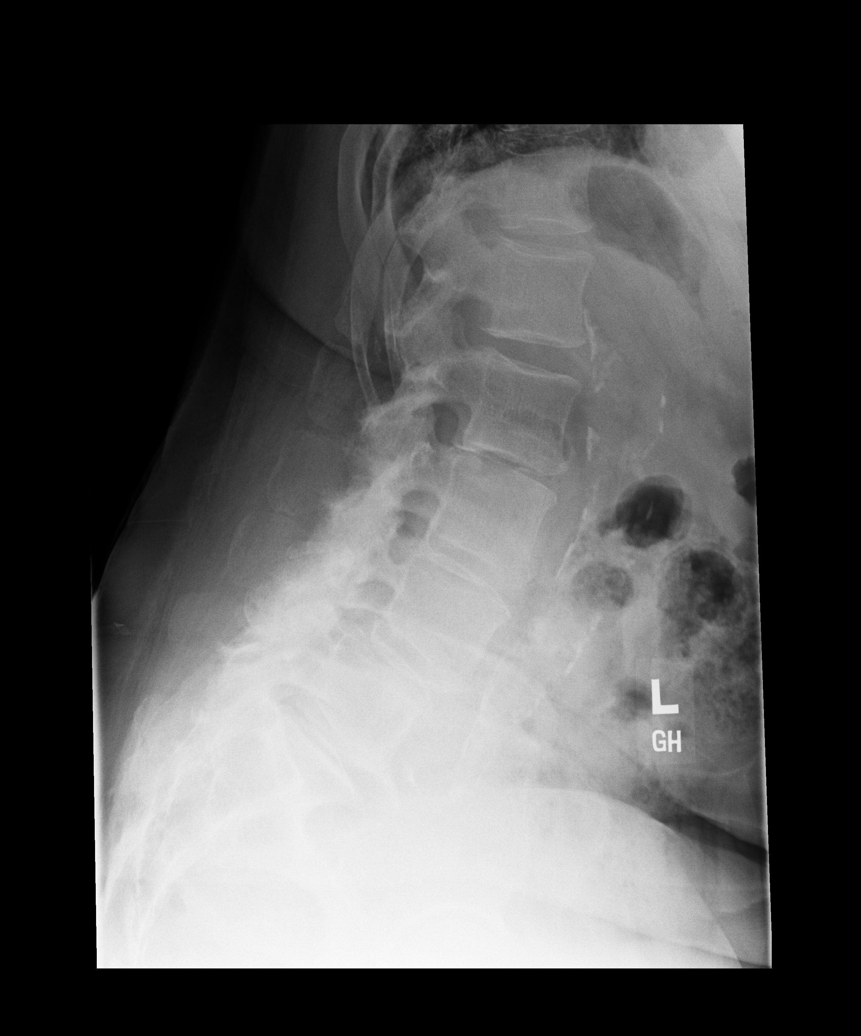

[dg lumbar spine complete w/bend 6+v (3 of 3)]
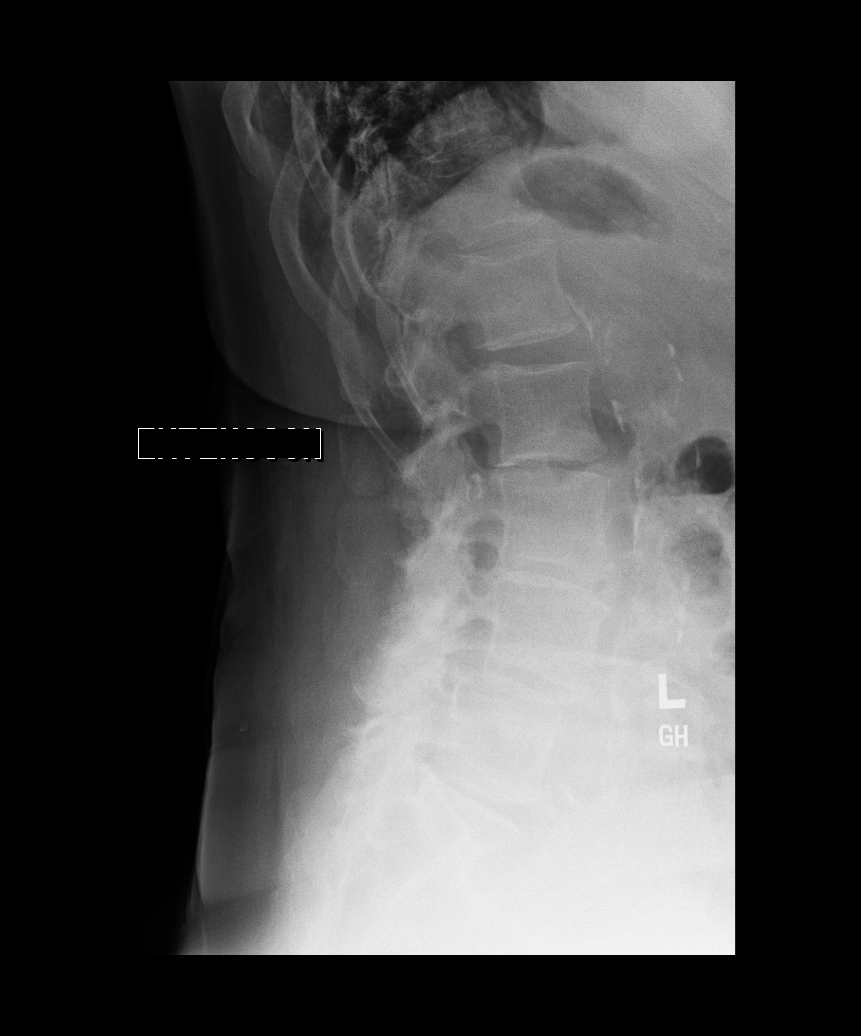

[6 of 6 positions shown; findings below may reference images not displayed]

FINDINGS: There is mild dextroscoliosis. No recent fracture is seen. There is
9 mm anterolisthesis at L4-L5 level. Alignment does not change
significantly between flexion and extension lateral views.
Degenerative changes are noted with anterior bony spurs, disc space
narrowing and facet hypertrophy in the lumbar spine. Extensive
arterial calcifications are seen in the soft tissues.
IMPRESSION: No recent fracture is seen in the lumbar spine. There is
first-degree spondylolisthesis at L4-L5 level. Lumbar spondylosis
with disc space narrowing, bony spurs and facet hypertrophy seen at
multiple levels in the lumbar spine.

## 2023-09-11 ENCOUNTER — Telehealth: Admitting: Nurse Practitioner

## 2023-09-11 ENCOUNTER — Other Ambulatory Visit: Payer: Self-pay | Admitting: Nurse Practitioner

## 2023-09-11 VITALS — Wt 140.0 lb

## 2023-09-11 DIAGNOSIS — R634 Abnormal weight loss: Secondary | ICD-10-CM

## 2023-09-11 DIAGNOSIS — M545 Low back pain, unspecified: Secondary | ICD-10-CM | POA: Diagnosis not present

## 2023-09-11 DIAGNOSIS — G4700 Insomnia, unspecified: Secondary | ICD-10-CM

## 2023-09-11 MED ORDER — CYCLOBENZAPRINE HCL 5 MG PO TABS: 5.0000 mg | ORAL_TABLET | Freq: Three times a day (TID) | ORAL | 1 refills | Status: DC | PRN

## 2023-09-11 MED ORDER — PREDNISONE 20 MG PO TABS: 20.0000 mg | ORAL_TABLET | Freq: Every day | ORAL | 0 refills | Status: DC

## 2023-09-11 MED ORDER — MIRTAZAPINE 7.5 MG PO TABS: 7.5000 mg | ORAL_TABLET | Freq: Every day | ORAL | 0 refills | Status: DC

## 2023-09-11 NOTE — Progress Notes (Addendum)
 Established Patient Office Visit  An audio-only tele-health visit was completed today for this patient. I connected with  Keerstin Louise Blaze on 09/19/23 utilizing audio-only technology and verified that I am speaking with the correct person using two identifiers. The patient was located at their home, and I was located at the office of [facility] at [facility] during the encounter. I discussed the limitations of evaluation and management by telemedicine. The patient expressed understanding and agreed to proceed.   **Audiovisual appointment was attempted.  Unfortunately visual component was not working so we moved forward with audio only visit.  Subjective   Patient ID: Virginia Watson, female    DOB: 03-Aug-1945  Age: 78 y.o. MRN: [id number]  Chief Complaint  Patient presents with   Insomnia    Insomnia/unintentional weight loss: has OSA has been using CPAP but still has a hard time staying asleep. She reports using CPAP 4-7 hours per night.  Reports overall symptoms of obstructive sleep apnea have improved specifically nocturia.  She does continue to have a difficult time staying asleep, often waking up every 2 hours or so.  Would like to discuss medication for insomnia.  She continues on Megace , reports her at home weight today is 140 pounds.  She has recently been helping to care for nieces while their mother has been dealing with health issues.  This has been causing significant stress for her.  She takes her Megace  intermittently.  Back pain: Has chronic back pain and is dealing with an acute flare.  Denies any radiation down legs, weakness of legs, numbness down legs, or new bowel/bladder incontinence.    ROS: see HPI    Objective:     Wt 140 lb (63.5 kg)   BMI 22.94 kg/m  BP Readings from Last 3 Encounters:  07/10/23 136/84  05/29/23 (!) 140/82  05/12/23 (!) 160/83   Wt Readings from Last 3 Encounters:  09/11/23 140 lb (63.5 kg)  07/10/23 158 lb  2 oz (71.7 kg)  05/29/23 153 lb 2 oz (69.5 kg)      Physical Exam Comprehensive physical exam not completed today as office visit was conducted remotely.  Patient sounded well over audio.  Patient was alert and oriented, and appeared to have appropriate judgment.   No results found for any visits on 09/11/23.    The 10-year ASCVD risk score (Arnett DK, et al., 2019) is: 19.1%    Assessment & Plan:   Problem List Items Addressed This Visit       Other   Unintentional weight loss - Primary   Chronic, BMI in normal range but based on her at home weight she is lost about 18 pounds over the last 2 months. Will start her on mirtazapine  to see if this can help stimulate appetite as well as help her with insomnia.  She will hold Megace  for now.  She will follow-up with me in approximately 3 months, but was encouraged to call office if she feels she needs to be seen sooner.      Relevant Medications   mirtazapine  (REMERON ) 7.5 MG tablet   Insomnia   Chronic Is currently using CPAP regularly per her report. Will trial mirtazapine  7.5 mg be taken by mouth at bedtime to help with insomnia as well as to help with appetite stimulation. Patient to follow-up in 3 months or sooner as needed.      Relevant Medications   mirtazapine  (REMERON ) 7.5 MG tablet   Low back pain  Acute on chronic Treat with Flexeril  5 mg 3 times daily as needed pain and prednisone  20 mg daily x 5 days.  Patient educated and warned about potential side effects of prednisone , told to not take NSAIDs in addition to prednisone , and was encouraged to reach out if symptoms worsen.  We also discussed signs and symptoms of cauda equina syndrome and if these were to occur to proceed to the emergency department she reports understanding.      Relevant Medications   cyclobenzaprine  (FLEXERIL ) 5 MG tablet   predniSONE  (DELTASONE ) 20 MG tablet    Return in about 3 months (around 12/12/2023) for F/U with Virginia Watson.  Total time  spent on audio call was 10 minutes.    Zorita Hiss, NP

## 2023-09-11 NOTE — Assessment & Plan Note (Signed)
 Acute on chronic Treat with Flexeril 5 mg 3 times daily as needed pain and prednisone  20 mg daily x 5 days.  Patient educated and warned about potential side effects of prednisone , told to not take NSAIDs in addition to prednisone , and was encouraged to reach out if symptoms worsen.  We also discussed signs and symptoms of cauda equina syndrome and if these were to occur to proceed to the emergency department she reports understanding.

## 2023-09-11 NOTE — Assessment & Plan Note (Signed)
 Chronic, BMI in normal range but based on her at home weight she is lost about 18 pounds over the last 2 months. Will start her on mirtazapine to see if this can help stimulate appetite as well as help her with insomnia.  She will hold Megace  for now.  She will follow-up with me in approximately 3 months, but was encouraged to call office if she feels she needs to be seen sooner.

## 2023-09-11 NOTE — Assessment & Plan Note (Signed)
 Chronic Is currently using CPAP regularly per her report. Will trial mirtazapine 7.5 mg be taken by mouth at bedtime to help with insomnia as well as to help with appetite stimulation. Patient to follow-up in 3 months or sooner as needed.

## 2023-09-16 ENCOUNTER — Telehealth: Payer: Self-pay | Admitting: *Deleted

## 2023-09-16 NOTE — Telephone Encounter (Signed)
 Copied from CRM [id number]. Topic: Clinical - Medical Advice >> Sep 12, 2023 10:56 AM Crist Dominion wrote: Reason for CRM: Patient states she received her CPAP machine is requesting a call back from [facility] nurse as she has questions about the machine.  Called the pt and there was no answer- LMTCB

## 2023-09-17 NOTE — Telephone Encounter (Signed)
 LMOVM to return call.

## 2023-09-19 NOTE — Telephone Encounter (Signed)
 ATC X2. LMTCB. I will send pt a Mychart message regarding her questions and then completing note per protocol. NFN

## 2023-09-23 ENCOUNTER — Telehealth: Payer: Self-pay | Admitting: Primary Care

## 2023-09-23 NOTE — Telephone Encounter (Signed)
 Compliance within 31-90 days CPAP start. July apt is fine

## 2023-09-25 ENCOUNTER — Ambulatory Visit: Admitting: Primary Care

## 2023-09-26 NOTE — Telephone Encounter (Signed)
 Pt is scheduled for 10-28-23. NFN

## 2023-10-01 ENCOUNTER — Telehealth: Payer: Self-pay | Admitting: Nurse Practitioner

## 2023-10-01 NOTE — Telephone Encounter (Unsigned)
 Copied from CRM [id number]. Topic: Clinical - Medical Advice >> Oct 01, 2023  2:57 PM Danae Duncans wrote: Reason for CRM: Pt would like pcp nurse to give a callback [phone]

## 2023-10-02 ENCOUNTER — Other Ambulatory Visit: Payer: Self-pay | Admitting: Nurse Practitioner

## 2023-10-02 ENCOUNTER — Other Ambulatory Visit: Payer: Self-pay | Admitting: Physician Assistant

## 2023-10-02 DIAGNOSIS — G4700 Insomnia, unspecified: Secondary | ICD-10-CM

## 2023-10-02 DIAGNOSIS — M545 Low back pain, unspecified: Secondary | ICD-10-CM

## 2023-10-02 DIAGNOSIS — R634 Abnormal weight loss: Secondary | ICD-10-CM

## 2023-10-06 ENCOUNTER — Ambulatory Visit: Payer: Self-pay

## 2023-10-06 NOTE — Telephone Encounter (Signed)
 FYI Only or Action Required?: FYI only for provider  Patient was last seen in primary care on 09/11/2023 by Zorita Hiss, NP. Called Nurse Triage reporting Foot Injury. Symptoms began a week ago. Interventions attempted: Nothing. Symptoms are: gradually improving.  Triage Disposition: See PCP When Office is Open (Within 3 Days)  Patient/caregiver understands and will follow disposition?: Yes, will follow dispositionYes, will follow disposition   1. ONSET: "When did the pain start?"      About a week ago started 2. LOCATION: "Where is the pain located?"      Bottom side of R foot is bruised 3. PAIN: "How bad is the pain?"    (Scale 1-10; or mild, moderate, severe)  - MILD (1-3): doesn't interfere with normal activities.   - MODERATE (4-7): interferes with normal activities (e.g., work or school) or awakens from sleep, limping.   - SEVERE (8-10): excruciating pain, unable to do any normal activities, unable to walk.      0 4. WORK OR EXERCISE: "Has there been any recent work or exercise that involved this part of the body?"      Denies  5. CAUSE: "What do you think is causing the foot pain?"     Unsure, states that she is worried that she has a blood clot that she developed from the Megestrol  6. OTHER SYMPTOMS: "Do you have any other symptoms?" (e.g., leg pain, rash, fever, numbness)     Denies swelling, denies pain, tingling is present but this is not new and it is not worse than normal.  Reason for Disposition . [1] Not caused by an injury AND [2] < 5 unexplained bruises  Answer Assessment - Initial Assessment Questions 1. ONSET: "When did the pain start?"      About a week ago started 2. LOCATION: "Where is the pain located?"      Bottom side of R foot is bruised 3. PAIN: "How bad is the pain?"    (Scale 1-10; or mild, moderate, severe)  - MILD (1-3): doesn't interfere with normal activities.   - MODERATE (4-7): interferes with normal activities (e.g., work or school) or awakens  from sleep, limping.   - SEVERE (8-10): excruciating pain, unable to do any normal activities, unable to walk.      0 4. WORK OR EXERCISE: "Has there been any recent work or exercise that involved this part of the body?"      Denies  5. CAUSE: "What do you think is causing the foot pain?"     Unsure, states that she is worried that she has a blood clot that she developed from the Megestrol  6. OTHER SYMPTOMS: "Do you have any other symptoms?" (e.g., leg pain, rash, fever, numbness)     Denies swelling, denies pain, tingling is present but this is not new and it is not worse than normal.   Denies DM. Denies injury.  Answer Assessment - Initial Assessment Questions 1. APPEARANCE of BRUISE: "Describe the bruise."      Getting better, bottom of whole R foot for about a week 3. NUMBER: "How many bruises are there?"      1 4. LOCATION: "Where is the bruise located?"      R foot bottom 5. ONSET: "How long ago did the bruise occur?"      Noticed it a week ago, unsure when it started, noticed when trimming nails 6. CAUSE: "Tell me how it happened."     Unsure, no pain, no injury  Protocols  used: Foot Pain-A-AH, Bruises-A-AH

## 2023-10-06 NOTE — Telephone Encounter (Signed)
 1st attempt, left voicemail for patient to return call for nurse triage.   Copied from CRM [id number]. Topic: Clinical - Medical Advice >> Oct 06, 2023  9:47 AM Luane Rumps D wrote: Reason for CRM: Patient thinks she has a blood clot in bottom of foot. Big bruise spot under bottom of foot and the medication has side effects of blood clots. megestrol  (MEGACE ) 40 MG/ML suspension - patient would like advice on whether or not she needs to go to the urgent care.

## 2023-10-07 ENCOUNTER — Encounter: Payer: Self-pay | Admitting: Family Medicine

## 2023-10-07 ENCOUNTER — Ambulatory Visit (INDEPENDENT_AMBULATORY_CARE_PROVIDER_SITE_OTHER): Admitting: Family Medicine

## 2023-10-07 VITALS — BP 130/80 | HR 80 | Temp 98.3°F | Ht 65.5 in | Wt 153.0 lb

## 2023-10-07 DIAGNOSIS — M545 Low back pain, unspecified: Secondary | ICD-10-CM | POA: Diagnosis not present

## 2023-10-07 DIAGNOSIS — R2231 Localized swelling, mass and lump, right upper limb: Secondary | ICD-10-CM | POA: Diagnosis not present

## 2023-10-07 DIAGNOSIS — S9031XA Contusion of right foot, initial encounter: Secondary | ICD-10-CM | POA: Diagnosis not present

## 2023-10-07 DIAGNOSIS — Z6825 Body mass index (BMI) 25.0-25.9, adult: Secondary | ICD-10-CM | POA: Insufficient documentation

## 2023-10-07 DIAGNOSIS — T148XXA Other injury of unspecified body region, initial encounter: Secondary | ICD-10-CM | POA: Insufficient documentation

## 2023-10-07 LAB — CBC WITH DIFFERENTIAL/PLATELET
Basophils Absolute: 0 10*3/uL (ref 0.0–0.1)
Basophils Relative: 0.7 % (ref 0.0–3.0)
Eosinophils Absolute: 0.1 10*3/uL (ref 0.0–0.7)
Eosinophils Relative: 2.2 % (ref 0.0–5.0)
HCT: 39 % (ref 36.0–46.0)
Hemoglobin: 13.1 g/dL (ref 12.0–15.0)
Lymphocytes Relative: 26.9 % (ref 12.0–46.0)
Lymphs Abs: 1.6 10*3/uL (ref 0.7–4.0)
MCHC: 33.7 g/dL (ref 30.0–36.0)
MCV: 91 fl (ref 78.0–100.0)
Monocytes Absolute: 0.4 10*3/uL (ref 0.1–1.0)
Monocytes Relative: 7.4 % (ref 3.0–12.0)
Neutro Abs: 3.8 10*3/uL (ref 1.4–7.7)
Neutrophils Relative %: 62.8 % (ref 43.0–77.0)
Platelets: 187 10*3/uL (ref 150.0–400.0)
RBC: 4.28 Mil/uL (ref 3.87–5.11)
RDW: 13.5 % (ref 11.5–15.5)
WBC: 6 10*3/uL (ref 4.0–10.5)

## 2023-10-07 LAB — COMPREHENSIVE METABOLIC PANEL WITH GFR
ALT: 11 U/L (ref 0–35)
AST: 16 U/L (ref 0–37)
Albumin: 4.3 g/dL (ref 3.5–5.2)
Alkaline Phosphatase: 76 U/L (ref 39–117)
BUN: 13 mg/dL (ref 6–23)
CO2: 25 meq/L (ref 19–32)
Calcium: 10.9 mg/dL — ABNORMAL HIGH (ref 8.4–10.5)
Chloride: 105 meq/L (ref 96–112)
Creatinine, Ser: 1.23 mg/dL — ABNORMAL HIGH (ref 0.40–1.20)
GFR: 42.98 mL/min — ABNORMAL LOW (ref >60.00)
Glucose, Bld: 88 mg/dL (ref 70–99)
Potassium: 3.8 meq/L (ref 3.5–5.1)
Sodium: 137 meq/L (ref 135–145)
Total Bilirubin: 0.8 mg/dL (ref 0.2–1.2)
Total Protein: 7.7 g/dL (ref 6.0–8.3)

## 2023-10-07 MED ORDER — CYCLOBENZAPRINE HCL 5 MG PO TABS
5.0000 mg | ORAL_TABLET | Freq: Two times a day (BID) | ORAL | 1 refills | Status: AC | PRN
Start: 2023-10-07 — End: ?

## 2023-10-07 NOTE — Patient Instructions (Signed)
 We are checking labs today, will be in contact with any results that require further attention  I have ordered an ultrasound for your right arm for further evaluation of the lump on your arm.  Follow-up with me for new or worsening symptoms.

## 2023-10-07 NOTE — Assessment & Plan Note (Signed)
Ultrasound of right arm ordered.

## 2023-10-07 NOTE — Progress Notes (Signed)
 Acute Office Visit  Subjective:     Patient ID: Virginia Watson, female    DOB: 06/30/1945, 78 y.o.   MRN: [id number]  Chief Complaint  Patient presents with   Acute Visit    Last Monday notice brusing on the botton her right foot it is gotten lighter in color     HPI Patient is in today for evaluation of bruising to the sole of her right foot.  States that her foot has been itching at times, she is only used lotion with little relief. She is concerned about bleeding, she does not have a good appetite and is worried about that affecting her blood and clotting.  Also reports lump to medial right forearm.  Denies known injury.  Denies redness, swelling, tenderness, heat to the area, lesion, other concerns today.  Inquiring about refill of Flexeril  for chronic low back pain and muscle spasms.  Has taken 5 mg of this about twice a day with good relief and improved mobility.  States that this does not make her sleepy.   ROS Per HPI      Objective:    BP 130/80 (BP Location: Left Arm, Patient Position: Sitting)   Pulse 80   Temp 98.3 F (36.8 C) (Temporal)   Ht 5' 5.5" (1.664 m)   Wt 153 lb (69.4 kg)   SpO2 90%   BMI 25.07 kg/m    Physical Exam Vitals and nursing note reviewed.  Constitutional:      General: She is not in acute distress.    Appearance: Normal appearance.  HENT:     Head: Normocephalic and atraumatic.     Right Ear: External ear normal.     Left Ear: External ear normal.     Nose: Nose normal.     Mouth/Throat:     Mouth: Mucous membranes are moist.     Pharynx: Oropharynx is clear.  Eyes:     Extraocular Movements: Extraocular movements intact.     Pupils: Pupils are equal, round, and reactive to light.  Cardiovascular:     Rate and Rhythm: Normal rate and regular rhythm.     Pulses: Normal pulses.     Heart sounds: Normal heart sounds.  Pulmonary:     Effort: Pulmonary effort is normal. No respiratory distress.     Breath sounds:  Normal breath sounds. No wheezing, rhonchi or rales.  Musculoskeletal:        General: Normal range of motion.     Cervical back: Normal range of motion.     Right lower leg: No edema.     Left lower leg: No edema.  Lymphadenopathy:     Cervical: No cervical adenopathy.  Skin:    Findings: Bruising present.     Comments: 3x3 cm raised soft tissue lump to medial R forearm.  No erythema, no bruising, no heat to the area, no lesion, no discharge. Bruising to medial aspect of plantar surface of the right foot.  No other abnormal bruising, no petechia noted  Neurological:     General: No focal deficit present.     Mental Status: She is alert and oriented to person, place, and time.  Psychiatric:        Mood and Affect: Mood normal.        Thought Content: Thought content normal.    No results found for any visits on 10/07/23.      Assessment & Plan:   Lump of skin of right upper extremity Assessment &  Plan: Ultrasound of right arm ordered  Orders: -     CBC with Differential/Platelet -     Comprehensive metabolic panel with GFR -     D-dimer, quantitative -     US  SOFT TISSUE RT UPPER EXTREMITY LTD (NON-VASCULAR); Future  Bruising Assessment & Plan: CBC, CMP today  Orders: -     CBC with Differential/Platelet -     Comprehensive metabolic panel with GFR -     D-dimer, quantitative  Low back pain at multiple sites Assessment & Plan: Refill Flexeril   Orders: -     Cyclobenzaprine  HCl; Take 1 tablet (5 mg total) by mouth 2 (two) times daily as needed for muscle spasms.  Dispense: 30 tablet; Refill: 1  BMI 25.0-25.9,adult Assessment & Plan: Continue efforts in increasing the amount of food that you are eating      Meds ordered this encounter  Medications   cyclobenzaprine  (FLEXERIL ) 5 MG tablet    Sig: Take 1 tablet (5 mg total) by mouth 2 (two) times daily as needed for muscle spasms.    Dispense:  30 tablet    Refill:  1    Return if symptoms worsen or fail  to improve.  Wellington Half, FNP

## 2023-10-07 NOTE — Assessment & Plan Note (Signed)
Refill Flexeril

## 2023-10-07 NOTE — Assessment & Plan Note (Signed)
 Continue efforts in increasing the amount of food that you are eating

## 2023-10-07 NOTE — Assessment & Plan Note (Signed)
-   CBC, CMP today.

## 2023-10-08 DIAGNOSIS — D631 Anemia in chronic kidney disease: Secondary | ICD-10-CM | POA: Diagnosis not present

## 2023-10-08 DIAGNOSIS — I129 Hypertensive chronic kidney disease with stage 1 through stage 4 chronic kidney disease, or unspecified chronic kidney disease: Secondary | ICD-10-CM | POA: Diagnosis not present

## 2023-10-08 DIAGNOSIS — N1832 Chronic kidney disease, stage 3b: Secondary | ICD-10-CM | POA: Diagnosis not present

## 2023-10-08 LAB — D-DIMER, QUANTITATIVE: D-Dimer, Quant: 4.3 ug{FEU}/mL — ABNORMAL HIGH (ref <0.50)

## 2023-10-09 ENCOUNTER — Other Ambulatory Visit: Payer: Self-pay

## 2023-10-09 ENCOUNTER — Emergency Department (HOSPITAL_COMMUNITY)

## 2023-10-09 ENCOUNTER — Emergency Department (HOSPITAL_COMMUNITY)
Admission: EM | Admit: 2023-10-09 | Discharge: 2023-10-09 | Disposition: A | Discharge status: Home / Self Care | Attending: Emergency Medicine | Admitting: Emergency Medicine

## 2023-10-09 ENCOUNTER — Ambulatory Visit: Payer: Self-pay | Admitting: Family Medicine

## 2023-10-09 ENCOUNTER — Encounter (HOSPITAL_COMMUNITY): Payer: Self-pay

## 2023-10-09 DIAGNOSIS — R0602 Shortness of breath: Secondary | ICD-10-CM | POA: Diagnosis not present

## 2023-10-09 DIAGNOSIS — R7989 Other specified abnormal findings of blood chemistry: Secondary | ICD-10-CM

## 2023-10-09 DIAGNOSIS — I700 Atherosclerosis of aorta: Secondary | ICD-10-CM | POA: Diagnosis not present

## 2023-10-09 DIAGNOSIS — R791 Abnormal coagulation profile: Secondary | ICD-10-CM | POA: Diagnosis not present

## 2023-10-09 DIAGNOSIS — J439 Emphysema, unspecified: Secondary | ICD-10-CM | POA: Diagnosis not present

## 2023-10-09 LAB — CBC WITH DIFFERENTIAL/PLATELET
Abs Immature Granulocytes: 0.01 10*3/uL (ref 0.00–0.07)
Basophils Absolute: 0 10*3/uL (ref 0.0–0.1)
Basophils Relative: 1 %
Eosinophils Absolute: 0.2 10*3/uL (ref 0.0–0.5)
Eosinophils Relative: 4 %
HCT: 36.3 % (ref 36.0–46.0)
Hemoglobin: 12.5 g/dL (ref 12.0–15.0)
Immature Granulocytes: 0 %
Lymphocytes Relative: 31 %
Lymphs Abs: 1.7 10*3/uL (ref 0.7–4.0)
MCH: 30.9 pg (ref 26.0–34.0)
MCHC: 34.4 g/dL (ref 30.0–36.0)
MCV: 89.6 fL (ref 80.0–100.0)
Monocytes Absolute: 0.4 10*3/uL (ref 0.1–1.0)
Monocytes Relative: 7 %
Neutro Abs: 3 10*3/uL (ref 1.7–7.7)
Neutrophils Relative %: 57 %
Platelets: 177 10*3/uL (ref 150–400)
RBC: 4.05 MIL/uL (ref 3.87–5.11)
RDW: 12.7 % (ref 11.5–15.5)
WBC: 5.3 10*3/uL (ref 4.0–10.5)
nRBC: 0 % (ref 0.0–0.2)

## 2023-10-09 LAB — BASIC METABOLIC PANEL WITH GFR
Anion gap: 9 (ref 5–15)
BUN: 11 mg/dL (ref 8–23)
CO2: 20 mmol/L — ABNORMAL LOW (ref 22–32)
Calcium: 10.1 mg/dL (ref 8.9–10.3)
Chloride: 108 mmol/L (ref 98–111)
Creatinine, Ser: 1.4 mg/dL — ABNORMAL HIGH (ref 0.44–1.00)
GFR, Estimated: 39 mL/min — ABNORMAL LOW (ref >60)
Glucose, Bld: 94 mg/dL (ref 70–99)
Potassium: 3.6 mmol/L (ref 3.5–5.1)
Sodium: 137 mmol/L (ref 135–145)

## 2023-10-09 LAB — BRAIN NATRIURETIC PEPTIDE: B Natriuretic Peptide: 33.1 pg/mL (ref 0.0–100.0)

## 2023-10-09 MED ORDER — IOHEXOL 350 MG/ML SOLN
65.0000 mL | Freq: Once | INTRAVENOUS | Status: AC | PRN
  Administered 2023-10-09: 65 mL via INTRAVENOUS

## 2023-10-09 NOTE — ED Provider Notes (Signed)
  EMERGENCY DEPARTMENT AT [facility] Provider Note   CSN: [id number] Arrival date & time: 10/09/23  1737     Patient presents with: Abnormal Lab   Virginia Watson is a 78 y.o. female.   78 year old female presents due to elevated dimer in the office.  Patient is on her medication that predisposes her to having blood clots.  She denies any respiratory symptoms at this time.  She has not been short of breath.  She has not had any cough or fever.  No leg pain or swelling.  She is not on blood thinners.  Patient sent here to have chest CT to rule out PE.  Patient states she feels at her baseline at this time       Prior to Admission medications   Medication Sig Start Date End Date Taking? Authorizing Provider  albuterol  (VENTOLIN  HFA) 108 (90 Base) MCG/ACT inhaler INHALE TWO puffs into THE lungs EVERY SIX HOURS AS NEEDED 03/13/23   Zorita Hiss, NP  amLODipine  (NORVASC ) 5 MG tablet Take 1 tablet (5 mg total) by mouth daily. 05/29/23   Zorita Hiss, NP  cholecalciferol (VITAMIN D3) 25 MCG (1000 UNIT) tablet Take 1,000 Units by mouth daily.    [provider]  cyclobenzaprine  (FLEXERIL ) 5 MG tablet Take 1 tablet (5 mg total) by mouth 2 (two) times daily as needed for muscle spasms. 10/07/23   Wellington Half, FNP  Fluticasone-Umeclidin-Vilant (TRELEGY ELLIPTA ) 100-62.5-25 MCG/ACT AEPB Inhale 1 puff into the lungs daily. 07/08/23   Zorita Hiss, NP  Iron-Vitamins (GERITOL COMPLETE PO) Take 1 tablet by mouth daily.    [provider]  metoprolol  tartrate (LOPRESSOR ) 25 MG tablet Take 1 tablet (25 mg total) by mouth 2 (two) times daily. 03/31/23   Zorita Hiss, NP  mirtazapine  (REMERON ) 7.5 MG tablet Take 1 tablet (7.5 mg total) by mouth at bedtime. 10/02/23   Webb, Padonda B, FNP    Allergies: Patient has no known allergies.    Review of Systems  All other systems reviewed and are negative.   Updated Vital Signs BP (!) 132/95 (BP  Location: Left Arm)   Pulse 87   Temp 98.3 F (36.8 C)   Resp 18   SpO2 93%   Physical Exam Vitals and nursing note reviewed.  Constitutional:      General: She is not in acute distress.    Appearance: Normal appearance. She is well-developed. She is not toxic-appearing.  HENT:     Head: Normocephalic and atraumatic.   Eyes:     General: Lids are normal.     Conjunctiva/sclera: Conjunctivae normal.     Pupils: Pupils are equal, round, and reactive to light.   Neck:     Thyroid : No thyroid  mass.     Trachea: No tracheal deviation.   Cardiovascular:     Rate and Rhythm: Normal rate and regular rhythm.     Heart sounds: Normal heart sounds. No murmur heard.    No gallop.  Pulmonary:     Effort: Pulmonary effort is normal. No respiratory distress.     Breath sounds: Normal breath sounds. No stridor. No decreased breath sounds, wheezing, rhonchi or rales.  Abdominal:     General: There is no distension.     Palpations: Abdomen is soft.     Tenderness: There is no abdominal tenderness. There is no rebound.   Musculoskeletal:        General: No tenderness. Normal range of  motion.     Cervical back: Normal range of motion and neck supple.   Skin:    General: Skin is warm and dry.     Findings: No abrasion or rash.   Neurological:     Mental Status: She is alert and oriented to person, place, and time. Mental status is at baseline.     GCS: GCS eye subscore is 4. GCS verbal subscore is 5. GCS motor subscore is 6.     Cranial Nerves: No cranial nerve deficit.     Sensory: No sensory deficit.     Motor: Motor function is intact.   Psychiatric:        Attention and Perception: Attention normal.        Speech: Speech normal.        Behavior: Behavior normal.     (all labs ordered are listed, but only abnormal results are displayed) Labs Reviewed  BASIC METABOLIC PANEL WITH GFR  CBC WITH DIFFERENTIAL/PLATELET  BRAIN NATRIURETIC PEPTIDE    EKG: EKG  Interpretation Date/Time:  Thursday October 09 2023 18:11:32 EDT Ventricular Rate:  86 PR Interval:  164 QRS Duration:  68 QT Interval:  362 QTC Calculation: 433 R Axis:   78  Text Interpretation: Normal sinus rhythm Cannot rule out Anterior infarct , age undetermined Abnormal ECG When compared with ECG of 23-Apr-2022 17:01, PREVIOUS ECG IS PRESENT Confirmed by Lind Repine ([id number]) on 10/09/2023 6:28:26 PM  Radiology: No results found.   Procedures   Medications Ordered in the ED - No data to display                                  Medical Decision Making  Patient is EKG shows normal sinus rhythm.  Labs here are reassuring.  CT angio chest without evidence of PE.  Patient has had 0 symptoms of chest pain or shortness of breath.  9:24 PM Patient's CTA angio chest reviewed again and radiologist concern for possible dissection.  I discussed this with the radiologist as well as with the vascular surgeon.  Vascular surgery, Dr. Fulton Job, reviewed patient's scans and feels that this is not acute and she can be follow-up in the office.  This was discussed with patient and her family     Final diagnoses:  None    ED Discharge Orders     None          Lind Repine, MD 10/09/23 2125

## 2023-10-09 NOTE — ED Provider Triage Note (Signed)
 Emergency Medicine Provider Triage Evaluation Note  Virginia Watson , a 78 y.o. female  was evaluated in triage.  Pt complains of abnormal labs.  Patient reports she is on the medications that predispose her to potential blood clot and she has blood test regularly to check for blood clot.  Today her doctor reports that she should come to the ER because her D-dimer is elevated at 4.  She also noticed some skin changes to the bottom of her right foot as well as some swelling to her right elbow.  Denies any worsening chest pain or shortness of breath but states she has history of COPD so does have baseline shortness of breath.  No fever chills or cough.  Review of Systems  Positive: As above Negative: As above  Physical Exam  BP (!) 132/95 (BP Location: Left Arm)   Pulse 87   Temp 98.3 F (36.8 C)   Resp 18   SpO2 93%  Gen:   Awake, no distress   Resp:  Normal effort  MSK:   Moves extremities without difficulty  Other:    Medical Decision Making  Medically screening exam initiated at 6:01 PM.  Appropriate orders placed.  Virginia Watson was informed that the remainder of the evaluation will be completed by another provider, this initial triage assessment does not replace that evaluation, and the importance of remaining in the ED until their evaluation is complete.     Debbra Fairy, PA-C 10/09/23 [phone]

## 2023-10-09 NOTE — ED Notes (Signed)
 Patient transported to CT

## 2023-10-09 NOTE — ED Notes (Signed)
 D-Dimer 4.5/extra blue top drawn

## 2023-10-09 NOTE — ED Notes (Signed)
 CCMD called and notified

## 2023-10-09 NOTE — ED Triage Notes (Signed)
 Pt mentions she on a medication called  Margate  she has been on it for two years and gets regualr labs due to blood clots being a side effect. She states today her MD office sent her here to R/O a PE due to her D-dimer level being a 4. She has no chest pain and no new shortness of breath, she is does have SOB at baseline due to COPD

## 2023-10-09 NOTE — Discharge Instructions (Addendum)
 The CAT scan of your chest today showed a possible weakening of your distal aorta.  Call Dr. Fulton Job tomorrow to schedule a follow-up visit.  Return here if you should develop chest pain/abdominal pain or shortness of breath

## 2023-10-20 ENCOUNTER — Ambulatory Visit
Admission: RE | Admit: 2023-10-20 | Discharge: 2023-10-20 | Discharge status: Home / Self Care | Source: Ambulatory Visit | Attending: Family Medicine

## 2023-10-20 DIAGNOSIS — R2231 Localized swelling, mass and lump, right upper limb: Secondary | ICD-10-CM | POA: Diagnosis not present

## 2023-10-22 DIAGNOSIS — G4733 Obstructive sleep apnea (adult) (pediatric): Secondary | ICD-10-CM | POA: Diagnosis not present

## 2023-10-24 DIAGNOSIS — G4733 Obstructive sleep apnea (adult) (pediatric): Secondary | ICD-10-CM | POA: Diagnosis not present

## 2023-10-28 ENCOUNTER — Ambulatory Visit (INDEPENDENT_AMBULATORY_CARE_PROVIDER_SITE_OTHER): Admitting: Primary Care

## 2023-10-28 ENCOUNTER — Encounter: Payer: Self-pay | Admitting: Primary Care

## 2023-10-28 VITALS — BP 130/50 | HR 83 | Temp 97.6°F | Ht 66.5 in | Wt 152.2 lb

## 2023-10-28 DIAGNOSIS — G4733 Obstructive sleep apnea (adult) (pediatric): Secondary | ICD-10-CM

## 2023-10-28 DIAGNOSIS — F17210 Nicotine dependence, cigarettes, uncomplicated: Secondary | ICD-10-CM

## 2023-10-28 DIAGNOSIS — J449 Chronic obstructive pulmonary disease, unspecified: Secondary | ICD-10-CM

## 2023-10-28 DIAGNOSIS — Z720 Tobacco use: Secondary | ICD-10-CM

## 2023-10-28 NOTE — Patient Instructions (Addendum)
-  CHRONIC OBSTRUCTIVE PULMONARY DISEASE WITH EMPHYSEMA: COPD with emphysema is a chronic lung condition that makes it hard to breathe. Your condition is well-managed with your current medication, Trelegy. Your recent CT scan showed no signs of a blood clot in your lungs but did show emphysema and very small lung nodules that are not concerning for cancer. Continue using your Trelegy inhaler daily in the morning. We will refer you to a lung cancer screening program for annual CT scans due to your smoking history. We also discussed using a nicotine  patch to help you quit smoking, starting with a 14 mg patch and gradually reducing the dose.  -OBSTRUCTIVE SLEEP APNEA: Obstructive sleep apnea is a condition where your breathing stops and starts during sleep. You are using your CPAP machine well, but we made some adjustments to improve your comfort. We set your CPAP pressure to a fixed 10 cm H2O to reduce air leaks and provided you with a new nasal mask to try. We also advised you to wear an eye mask to manage air leaks affecting your eyes. Please contact us  if the new pressure setting is uncomfortable.  -CURRENT TOBACCO USE: You are currently smoking about 10 cigarettes a day, which increases your risk for many health issues. We discussed smoking cessation options, including using a nicotine  patch. We recommend starting with a 14 mg nicotine  patch and tapering down over 8 weeks. Please avoid smoking while using the nicotine  patch to prevent excessive nicotine  intake. You can also call 1-800-QUIT-NOW for free nicotine  patches and additional support.  INSTRUCTIONS: Please continue using your Trelegy inhaler daily and follow the new CPAP settings. Try the new nasal mask and wear an eye mask to manage air leaks. Start using the 14 mg nicotine  patch and gradually reduce the dose over 8 weeks. Avoid smoking while using the patch. Contact us  if you experience any discomfort with the new CPAP settings or need further  assistance with smoking cessation. We will refer you to a lung cancer screening program for annual CT scans due to your smoking history.  Follow-up 6 months with [facility] NP or sooner if needed

## 2023-10-28 NOTE — Progress Notes (Signed)
 @Patient  ID: Virginia Watson, female    DOB: March 07, 1946, 78 y.o.   MRN: [id number]  Chief Complaint  Patient presents with   Follow-up    CPAP f/u. COPD and Respiratory Failure. Complains of S.O.B for the last 2.5 weeks- exertion     Referring provider: Elnor Lauraine BRAVO, NP  HPI: 78 year old female, current everyday smoker. PMH HTN, COPD, chronic respiratory failure, osteopenia, CKD, heart murmur, unintentional weight loss, H.pylori, nicotine  dependence.   10/28/2023 Discussed the use of AI scribe software for clinical note transcription with the patient, who gave verbal consent to proceed.  History of Present Illness   Virginia Watson is a 78 year old female with sleep apnea and COPD who presents for a follow-up visit.  She recently received a new CPAP machine and uses it consistently 97% of the time, with 70% of nights achieving more than four hours of use. She experiences difficulty adjusting the mask, leading to occasional removal during the night. The current CPAP settings are on auto, with pressures ranging from 5 to 15, and her apnea score is 3.1. She has mild air leaks affecting her eyes.   For her COPD, she uses Trelegy daily in the morning and a rescue inhaler automatically in the mornings and at night. Her breathing is stable, although she experienced a brief period of increased shortness of breath after a CT scan in June, which resolved after three days. She is not using supplemental oxygen . A CT scan in June was negative for pulmonary embolism but showed emphysema and very small scattered pulmonary nodules, the largest being 3 mm in the left upper lobe.  She is a current smoker, having smoked for more than 20 years, and currently smokes about 10 cigarettes a day. She previously reduced her smoking to three cigarettes a day with prescribed medication but has since increased her intake.  No active bronchitis symptoms, no coughing up colored mucus, no fevers.       Airview download 09/27/23-10/26/23 Usage days 29/30; 21 days (70%) Average usage days used 4 hours 51 mins Pressure 5-15cm h20 (10cm h20-95%) Airleaks 13.6L/min (95%) AHI 3.1    No Known Allergies  Immunization History  Administered Date(s) Administered   Fluad Quad(high Dose 65+) 03/21/2020, 03/01/2022   Fluad Trivalent(High Dose 65+) 05/29/2023   Influenza Split 03/01/2015   PFIZER(Purple Top)SARS-COV-2 Vaccination 07/04/2019, 08/11/2019   PNEUMOCOCCAL CONJUGATE-20 03/01/2022   Pneumococcal Polysaccharide-23 10/14/2017    Past Medical History:  Diagnosis Date   Allergy    Bone spur 04/2021   spine   COPD (chronic obstructive pulmonary disease) (HCC)    per pt told it was seen on an xray from an ER visit, not being treated   Heart murmur 2024   Normal Valves on Echo --likely related to aortic sclerosis.   Hypertension    Sciatica 04/2021   Scoliosis 04/2021    Tobacco History: Social History   Tobacco Use  Smoking Status Every Day   Current packs/day: 0.25   Average packs/day: 0.3 packs/day for 50.0 years (12.5 ttl pk-yrs)   Types: Cigarettes  Smokeless Tobacco Never  Tobacco Comments   Pt states she smokes 10 cigs a day. AB, CMA 10-28-23      Currently smoking 0.5 ppd   1-800-QUIT-NOW (1-332-149-0460).   [email or web address]            1-800-QUIT-NOW [phone]).   [email or web address]      Ready to quit: Not Answered Counseling given: Not Answered Tobacco comments: Pt  states she smokes 10 cigs a day. AB, CMA 10-28-23  Currently smoking 0.5 ppd 1-800-QUIT-NOW (1-703-131-3675). [email or web address]    1-800-QUIT-NOW (920)475-4849). [email or web address]    Outpatient Medications Prior to Visit  Medication Sig Dispense Refill   albuterol  (VENTOLIN  HFA) 108 (90 Base) MCG/ACT inhaler INHALE TWO puffs into THE lungs EVERY SIX HOURS AS NEEDED 6.7 g 3   amLODipine  (NORVASC ) 5 MG tablet Take 1 tablet (5 mg total)  by mouth daily. 90 tablet 1   cholecalciferol (VITAMIN D3) 25 MCG (1000 UNIT) tablet Take 1,000 Units by mouth daily.     cyclobenzaprine  (FLEXERIL ) 5 MG tablet Take 1 tablet (5 mg total) by mouth 2 (two) times daily as needed for muscle spasms. 30 tablet 1   Fluticasone-Umeclidin-Vilant (TRELEGY ELLIPTA ) 100-62.5-25 MCG/ACT AEPB Inhale 1 puff into the lungs daily. 60 each 3   Iron-Vitamins (GERITOL COMPLETE PO) Take 1 tablet by mouth daily.     metoprolol  tartrate (LOPRESSOR ) 25 MG tablet Take 1 tablet (25 mg total) by mouth 2 (two) times daily. 180 tablet 3   mirtazapine  (REMERON ) 7.5 MG tablet Take 1 tablet (7.5 mg total) by mouth at bedtime. 90 tablet 0   No facility-administered medications prior to visit.   Review of Systems  Review of Systems  Constitutional: Negative.   HENT: Negative.    Respiratory: Negative.    Cardiovascular: Negative.    Physical Exam  BP (!) 130/50 (BP Location: Left Arm, Patient Position: Sitting, Cuff Size: Normal)   Pulse 83   Temp 97.6 F (36.4 C) (Temporal)   Ht 5' 6.5 (1.689 m)   Wt 152 lb 3.2 oz (69 kg)   SpO2 94%   BMI 24.20 kg/m  Physical Exam Constitutional:      General: She is not in acute distress.    Appearance: Normal appearance. She is not ill-appearing.  HENT:     Head: Normocephalic and atraumatic.     Mouth/Throat:     Mouth: Mucous membranes are moist.     Pharynx: Oropharynx is clear.   Cardiovascular:     Rate and Rhythm: Normal rate and regular rhythm.  Pulmonary:     Effort: Pulmonary effort is normal.     Breath sounds: Normal breath sounds. No wheezing, rhonchi or rales.   Musculoskeletal:        General: Normal range of motion.   Skin:    General: Skin is warm and dry.   Neurological:     General: No focal deficit present.     Mental Status: She is alert and oriented to person, place, and time. Mental status is at baseline.   Psychiatric:        Mood and Affect: Mood normal.        Behavior: Behavior  normal.        Thought Content: Thought content normal.        Judgment: Judgment normal.      Lab Results:  CBC    Component Value Date/Time   WBC 5.3 10/09/2023 1801   RBC 4.05 10/09/2023 1801   HGB 12.5 10/09/2023 1801   HCT 36.3 10/09/2023 1801   PLT 177 10/09/2023 1801   MCV 89.6 10/09/2023 1801   MCH 30.9 10/09/2023 1801   MCHC 34.4 10/09/2023 1801   RDW 12.7 10/09/2023 1801   LYMPHSABS 1.7 10/09/2023 1801   MONOABS 0.4 10/09/2023 1801   EOSABS 0.2 10/09/2023 1801   BASOSABS 0.0 10/09/2023 1801    BMET    Component Value Date/Time  NA 137 10/09/2023 1801   NA 141 05/21/2022 1459   K 3.6 10/09/2023 1801   CL 108 10/09/2023 1801   CO2 20 (L) 10/09/2023 1801   GLUCOSE 94 10/09/2023 1801   BUN 11 10/09/2023 1801   BUN 17 05/21/2022 1459   CREATININE 1.40 (H) 10/09/2023 1801   CREATININE 1.45 (H) 12/10/2019 1450   CALCIUM 10.1 10/09/2023 1801   GFRNONAA 39 (L) 10/09/2023 1801   GFRNONAA 36 (L) 12/10/2019 1450   GFRAA 42 (L) 12/10/2019 1450    BNP    Component Value Date/Time   BNP 33.1 10/09/2023 1801    ProBNP No results found for: PROBNP  Imaging: US  RT UPPER EXTREM LTD SOFT TISSUE NON VASCULAR Result Date: 10/26/2023 CLINICAL DATA:  Palpable abnormality of right forearm. EXAM: ULTRASOUND RIGHT UPPER EXTREMITY LIMITED TECHNIQUE: Ultrasound examination of the upper extremity soft tissues was performed in the area of clinical concern. COMPARISON:  None Available. FINDINGS: Sonographic evaluation in the region of palpable abnormality of the right medial elbow region shows no evidence focal soft tissue mass, abnormal fluid collection or other sonographic abnormality. IMPRESSION: No sonographic abnormality identified in the region of palpable abnormality of the right medial elbow region. Electronically Signed   By: Marcey Moan M.D.   On: 10/26/2023 12:51   CT Angio Chest PE W and/or Wo Contrast Result Date: 10/09/2023 CLINICAL DATA:  Positive D-dimer,  baseline shortness of breath per epic notes EXAM: CT ANGIOGRAPHY CHEST WITH CONTRAST TECHNIQUE: Multidetector CT imaging of the chest was performed using the standard protocol during bolus administration of intravenous contrast. Multiplanar CT image reconstructions and MIPs were obtained to evaluate the vascular anatomy. RADIATION DOSE REDUCTION: This exam was performed according to the departmental dose-optimization program which includes automated exposure control, adjustment of the mA and/or kV according to patient size and/or use of iterative reconstruction technique. CONTRAST:  65mL OMNIPAQUE  IOHEXOL  350 MG/ML SOLN COMPARISON:  CT 08/26/2022, 07/23/2022, chest CT 01/10/2022 FINDINGS: Cardiovascular: Satisfactory opacification of the pulmonary arteries to the segmental level. No evidence of pulmonary embolism. Moderate aortic atherosclerosis. No definite aneurysm. No convincing dissection at the ascending aorta or arch. Appearance of differential density within the distal descending thoracic aorta/proximal abdominal aorta, series 5, image 117 through 155 and coronal series 8 image 64 through 66. Mediastinum/Nodes: Patent trachea. No suspicious thyroid  mass. Subcentimeter mediastinal lymph nodes. Esophagus within normal limits. Lungs/Pleura: Emphysema. No acute airspace disease, pleural effusion or pneumothorax. Stable scattered pulmonary nodules, for example left upper lobe 3 mm nodule on series 6, image 41, no imaging follow-up is recommended Upper Abdomen: No acute finding other than possible vascular finding described above Musculoskeletal: No acute osseous abnormality Review of the MIP images confirms the above findings. IMPRESSION: 1. Negative for acute pulmonary embolus. 2. Appearance of differential density within the distal descending thoracic and proximal abdominal aorta, indeterminate in appearance for mixing artifact (poorly opacified) versus faintly visible dissection. Dedicated CT angiography with  aorta protocol could be obtained for further evaluation. 3. Emphysema. No acute airspace disease. 4. Aortic Atherosclerosis (ICD10-I70.0) and Emphysema (ICD10-J43.9). Electronically Signed   By: Luke Bun M.D.   On: 10/09/2023 19:56     Assessment & Plan:   1. Moderate obstructive sleep apnea (Primary) - Ambulatory Referral for DME  2. Current tobacco use - Ambulatory Referral for Lung Cancer Scre  3. Chronic obstructive pulmonary disease, unspecified COPD type (HCC)  Assessment and Plan    Chronic Obstructive Pulmonary Disease with Emphysema COPD with emphysema is well-managed  on Trelegy. No active bronchitis symptoms, no colored sputum, and no fever. Recent CT scan in June was negative for pulmonary embolism but showed emphysema and very small scattered pulmonary nodules, largest being 3 mm in the left upper lobe, not concerning for malignancy. No current oxygen  use. - Continue Trelegy 100mcg one puff daily in the morning. - Refer to lung cancer screening program for annual CT scans due to current smoking status and history. - Provide information on nicotine  patch for smoking cessation, recommend starting with 14 mg patch and tapering down.  Obstructive Sleep Apnea Obstructive sleep apnea managed with CPAP therapy. Compliance is good with 97% usage and 70% of nights achieving more than 4 hours of use. Current settings are auto with pressures between 5 to 15 cm H2O, average pressure is 7 cm H2O, and maximum pressure is 11.4 cm H2O. Apnea score is 3.1 with mild air leaks. She reports discomfort with current mask leading to occasional removal during sleep. - Set CPAP pressure to a fixed 10 cm H2O to improve comfort and reduce air leaks. - Provide Airfit N10 nasal CPAP mask for trial to address mask discomfort. - Advise wearing an eye mask to manage air leaks affecting eyes. - Instruct to contact if the new pressure setting is uncomfortable.  Current Tobacco Use Current smoker with  more than 20 years of smoking. Currently smoking about 10 cigarettes a day, previously reduced to 3 cigarettes a day. Discussed smoking cessation options and risks of continued smoking. - Provide information on 1-800-QUIT-NOW for free nicotine  patches. - Recommend starting with 14 mg nicotine  patch and tapering down over 8 weeks. - Advise against smoking while using the nicotine  patch to avoid excessive nicotine  intake.  Recording duration: 16 minutes      Almarie LELON Ferrari, NP 10/28/2023

## 2023-10-29 ENCOUNTER — Other Ambulatory Visit: Payer: Self-pay | Admitting: Nurse Practitioner

## 2023-10-29 DIAGNOSIS — R634 Abnormal weight loss: Secondary | ICD-10-CM

## 2023-11-10 ENCOUNTER — Other Ambulatory Visit: Payer: Self-pay | Admitting: Nurse Practitioner

## 2023-11-10 DIAGNOSIS — J432 Centrilobular emphysema: Secondary | ICD-10-CM

## 2023-11-10 DIAGNOSIS — J449 Chronic obstructive pulmonary disease, unspecified: Secondary | ICD-10-CM

## 2023-11-10 DIAGNOSIS — R634 Abnormal weight loss: Secondary | ICD-10-CM

## 2023-11-28 DIAGNOSIS — G4733 Obstructive sleep apnea (adult) (pediatric): Secondary | ICD-10-CM | POA: Diagnosis not present

## 2023-12-03 ENCOUNTER — Ambulatory Visit: Attending: Vascular Surgery | Admitting: Vascular Surgery

## 2023-12-03 ENCOUNTER — Other Ambulatory Visit: Payer: Self-pay

## 2023-12-03 ENCOUNTER — Encounter: Payer: Self-pay | Admitting: Vascular Surgery

## 2023-12-03 VITALS — BP 154/94 | HR 67 | Temp 97.8°F | Ht 66.5 in | Wt 148.0 lb

## 2023-12-03 DIAGNOSIS — I71012 Dissection of descending thoracic aorta: Secondary | ICD-10-CM | POA: Diagnosis not present

## 2023-12-03 NOTE — Progress Notes (Signed)
 Patient ID: Virginia Watson, female   DOB: [birth year], 78 y.o.   MRN: [id number]  Reason for Consult: Follow-up   Referred by Dasie Faden, MD  Subjective:     HPI:  Virginia Watson is a 78 y.o. female without significant history of vascular disease.  She states that she does have frequent shortness of breath there is COPD and is a current and longtime smoker.  Recently she had chest pain and shortness of breath she was evaluated for PE with CTA was found to have concerning findings for aortic dissection.  She does have chronic long-term back pain which has been present for many years but no new back or abdominal pain.  She has no personal or family history of aneurysm disease.  She walks without limitation.  Past Medical History:  Diagnosis Date   Allergy    Bone spur 04/2021   spine   COPD (chronic obstructive pulmonary disease) (HCC)    per pt told it was seen on an xray from an ER visit, not being treated   Heart murmur 2024   Normal Valves on Echo --likely related to aortic sclerosis.   Hypertension    Sciatica 04/2021   Scoliosis 04/2021   Family History  Problem Relation Age of Onset   Alcohol abuse Mother    Asthma Mother    Hyperlipidemia Mother    Alcohol abuse Father    Heart attack Father    Depression Sister    Hypertension Sister    Colon polyps Sister    Drug abuse Brother    Alcohol abuse Brother    Colon cancer Brother    Esophageal cancer Neg Hx    Stomach cancer Neg Hx    Rectal cancer Neg Hx    Breast cancer Neg Hx    Past Surgical History:  Procedure Laterality Date   TRANSTHORACIC ECHOCARDIOGRAM  07/09/2022   Normal LV Fxn - LVEF 65-70%.  No RWMA. Mild Concentric LVH. Gr 1 DD - normal for age.  Normal Strain. Normal RV & RVP/RAP. Normal MV & AoV (~aortic sclerosis)   UPPER GASTROINTESTINAL ENDOSCOPY      Short Social History:  Social History   Tobacco Use   Smoking status: Every Day    Current packs/day: 0.50     Average packs/day: 0.3 packs/day for 51.6 years (13.3 ttl pk-yrs)    Types: Cigarettes    Start date: 2024   Smokeless tobacco: Never   Tobacco comments:    Pt states she smokes 10 cigs a day. AB, CMA 10-28-23        Currently smoking 0.5 ppd    1-800-QUIT-NOW (1-602-617-0276).    [email or web address]                1-800-QUIT-NOW [phone]).    [email or web address]      Substance Use Topics   Alcohol use: Yes    Comment: on holidays    No Known Allergies  Current Outpatient Medications  Medication Sig Dispense Refill   albuterol  (VENTOLIN  HFA) 108 (90 Base) MCG/ACT inhaler INHALE TWO puffs into THE lungs EVERY SIX HOURS AS NEEDED 6.7 g 3   amLODipine  (NORVASC ) 5 MG tablet Take 1 tablet (5 mg total) by mouth daily. 90 tablet 1   cholecalciferol (VITAMIN D3) 25 MCG (1000 UNIT) tablet Take 1,000 Units by mouth daily.     cyclobenzaprine  (FLEXERIL ) 5 MG tablet Take 1 tablet (5 mg total) by mouth 2 (two) times daily as needed for muscle spasms. 30  tablet 1   Iron-Vitamins (GERITOL COMPLETE PO) Take 1 tablet by mouth daily.     megestrol  (MEGACE ) 40 MG/ML suspension take 10ml BY MOUTH EVERY DAY 240 mL 2   metoprolol  tartrate (LOPRESSOR ) 25 MG tablet Take 1 tablet (25 mg total) by mouth 2 (two) times daily. 180 tablet 3   mirtazapine  (REMERON ) 7.5 MG tablet Take 1 tablet (7.5 mg total) by mouth at bedtime. 90 tablet 0   TRELEGY ELLIPTA  100-62.5-25 MCG/ACT AEPB INHALE ONE PUFF into THE lungs EVERY DAY 60 each 3   No current facility-administered medications for this visit.    Review of Systems  Constitutional:  Constitutional negative. HENT: HENT negative.  Eyes: Eyes negative.  Respiratory: Positive for shortness of breath.  GI: Gastrointestinal negative.  Musculoskeletal: Positive for back pain.  Neurological: Neurological negative. Hematologic: Hematologic/lymphatic negative.  Psychiatric: Psychiatric negative.        Objective:  Objective    Vitals:   12/03/23 1355  BP: (!) 154/94  Pulse: 67  Temp: 97.8 F (36.6 C)  SpO2: (!) 89%  Weight: 148 lb (67.1 kg)  Height: 5' 6.5 (1.689 m)   Body mass index is 23.53 kg/m.  Physical Exam HENT:     Head: Normocephalic.     Nose: Nose normal.  Eyes:     Pupils: Pupils are equal, round, and reactive to light.  Cardiovascular:     Rate and Rhythm: Normal rate.     Pulses:          Radial pulses are 2+ on the right side and 2+ on the left side.       Femoral pulses are 2+ on the right side and 2+ on the left side.      Popliteal pulses are 2+ on the right side and 2+ on the left side.  Pulmonary:     Effort: Pulmonary effort is normal.  Abdominal:     General: Abdomen is flat.     Palpations: Abdomen is soft. There is no mass.  Musculoskeletal:        General: Normal range of motion.     Right lower leg: No edema.     Left lower leg: No edema.  Skin:    General: Skin is warm.  Neurological:     General: No focal deficit present.     Mental Status: She is alert.  Psychiatric:        Mood and Affect: Mood normal.        Thought Content: Thought content normal.        Judgment: Judgment normal.     Data: CT ANGIOGRAPHY CHEST WITH CONTRAST   TECHNIQUE: Multidetector CT imaging of the chest was performed using the standard protocol during bolus administration of intravenous contrast. Multiplanar CT image reconstructions and MIPs were obtained to evaluate the vascular anatomy.   RADIATION DOSE REDUCTION: This exam was performed according to the departmental dose-optimization program which includes automated exposure control, adjustment of the mA and/or kV according to patient size and/or use of iterative reconstruction technique.   CONTRAST:  65mL OMNIPAQUE  IOHEXOL  350 MG/ML SOLN   COMPARISON:  CT 08/26/2022, 07/23/2022, chest CT 01/10/2022   FINDINGS: Cardiovascular: Satisfactory opacification of the pulmonary arteries to the segmental level. No  evidence of pulmonary embolism. Moderate aortic atherosclerosis. No definite aneurysm. No convincing dissection at the ascending aorta or arch. Appearance of differential density within the distal descending thoracic aorta/proximal abdominal aorta, series 5, image 117 through 155 and coronal series 8  image 64 through 66.   Mediastinum/Nodes: Patent trachea. No suspicious thyroid  mass. Subcentimeter mediastinal lymph nodes. Esophagus within normal limits.   Lungs/Pleura: Emphysema. No acute airspace disease, pleural effusion or pneumothorax. Stable scattered pulmonary nodules, for example left upper lobe 3 mm nodule on series 6, image 41, no imaging follow-up is recommended   Upper Abdomen: No acute finding other than possible vascular finding described above   Musculoskeletal: No acute osseous abnormality   Review of the MIP images confirms the above findings.   IMPRESSION: 1. Negative for acute pulmonary embolus. 2. Appearance of differential density within the distal descending thoracic and proximal abdominal aorta, indeterminate in appearance for mixing artifact (poorly opacified) versus faintly visible dissection. Dedicated CT angiography with aorta protocol could be obtained for further evaluation. 3. Emphysema. No acute airspace disease. 4.     Assessment/Plan:     78 year old female with likely incidental findings of descending thoracic aortic dissection which is likely chronic.  We reviewed her CT scan together today and given the concerning findings we will obtain formal CT angio of the chest abdomen pelvis.  After this we will follow-up to discuss options moving forward.  All questions were answered she demonstrates good understanding.    Penne Lonni Colorado MD [facility]

## 2023-12-09 ENCOUNTER — Other Ambulatory Visit: Payer: Self-pay

## 2023-12-09 DIAGNOSIS — T508X5S Adverse effect of diagnostic agents, sequela: Secondary | ICD-10-CM

## 2023-12-09 MED ORDER — PREDNISONE 50 MG PO TABS: ORAL_TABLET | ORAL | 0 refills | Status: DC

## 2023-12-09 MED ORDER — DIPHENHYDRAMINE HCL 50 MG PO CAPS: ORAL_CAPSULE | ORAL | 0 refills | Status: DC

## 2023-12-16 ENCOUNTER — Ambulatory Visit (INDEPENDENT_AMBULATORY_CARE_PROVIDER_SITE_OTHER)

## 2023-12-16 VITALS — BP 128/80 | HR 67 | Ht 66.0 in | Wt 145.6 lb

## 2023-12-16 DIAGNOSIS — K635 Polyp of colon: Secondary | ICD-10-CM

## 2023-12-16 DIAGNOSIS — Z0000 Encounter for general adult medical examination without abnormal findings: Secondary | ICD-10-CM | POA: Diagnosis not present

## 2023-12-16 NOTE — Patient Instructions (Addendum)
 Virginia Watson , Thank you for taking time out of your busy schedule to complete your Annual Wellness Visit with me. I enjoyed our conversation and look forward to speaking with you again next year. I, as well as your care team,  appreciate your ongoing commitment to your health goals. Please review the following plan we discussed and let me know if I can assist you in the future. Your Game plan/ To Do List    Referrals: If you haven't heard from the office you've been referred to, please reach out to them at the phone provided. Referral to [facility] for a repeat Colonoscopy  Follow up Visits: We will see or speak with you next year for your Next Medicare AWV with our clinical staff Have you seen your provider in the last 6 months (3 months if uncontrolled diabetes)? No  Clinician Recommendations:  Aim for 30 minutes of exercise or brisk walking, 6-8 glasses of water, and 5 servings of fruits and vegetables each day. Educated and advised on getting the Shingles and Tdap vaccines in 2025.      This is a list of the screenings recommended for you:  Health Maintenance  Topic Date Due   DTaP/Tdap/Td vaccine (1 - Tdap) Never done   Zoster (Shingles) Vaccine (1 of 2) Never done   Colon Cancer Screening  02/09/2023   Flu Shot  11/28/2023   Medicare Annual Wellness Visit  12/15/2024   Pneumococcal Vaccine for age over 35  Completed   DEXA scan (bone density measurement)  Completed   Hepatitis C Screening  Completed   HPV Vaccine  Aged Out   Meningitis B Vaccine  Aged Out   Screening for Lung Cancer  Discontinued   COVID-19 Vaccine  Discontinued    Advanced directives: (Declined) Advance directive discussed with you today. Even though you declined this today, please call our office should you change your mind, and we can give you the proper paperwork for you to fill out. Advance Care Planning is important because it:  [x]  Makes sure you receive the medical care that is consistent with your  values, goals, and preferences  [x]  It provides guidance to your family and loved ones and reduces their decisional burden about whether or not they are making the right decisions based on your wishes.  Follow the link provided in your after visit summary or read over the paperwork we have mailed to you to help you started getting your Advance Directives in place. If you need assistance in completing these, please reach out to us  so that we can help you!

## 2023-12-16 NOTE — Progress Notes (Signed)
 Subjective:  Please attest and cosign this visit due to patients primary care provider not being in the office at the time the visit was completed.  (Pt of Lauraine Pereyra, NP)   Virginia Watson is a 78 y.o. who presents for a Medicare Wellness preventive visit.  As a reminder, Annual Wellness Visits don't include a physical exam, and some assessments may be limited, especially if this visit is performed virtually. We may recommend an in-person follow-up visit with your provider if needed.  Visit Complete: In person  Persons Participating in Visit: Patient.  AWV Questionnaire: No: Patient Medicare AWV questionnaire was not completed prior to this visit.  Cardiac Risk Factors include: advanced age (>48men, >40 women);dyslipidemia;smoking/ tobacco exposure     Objective:    Today's Vitals   12/16/23 1411  BP: 128/80  Pulse: 67  SpO2: 93%  Weight: 145 lb 9.6 oz (66 kg)  Height: 5' 6 (1.676 m)   Body mass index is 23.5 kg/m.     12/16/2023    2:15 PM 10/09/2023    5:48 PM 08/01/2023   10:41 AM 04/23/2022    4:57 PM 03/28/2022    1:42 PM 03/20/2020   10:00 PM 03/19/2019   12:35 AM  Advanced Directives  Does Patient Have a Medical Advance Directive? No No No No No No No  Would patient like information on creating a medical advance directive? No - Patient declined No - Patient declined No - Patient declined No - Patient declined No - Patient declined No - Patient declined No - Patient declined    Current Medications (verified) Outpatient Encounter Medications as of 12/16/2023  Medication Sig   albuterol  (VENTOLIN  HFA) 108 (90 Base) MCG/ACT inhaler INHALE TWO puffs into THE lungs EVERY SIX HOURS AS NEEDED   amLODipine  (NORVASC ) 5 MG tablet Take 1 tablet (5 mg total) by mouth daily.   cholecalciferol (VITAMIN D3) 25 MCG (1000 UNIT) tablet Take 1,000 Units by mouth daily.   cyclobenzaprine  (FLEXERIL ) 5 MG tablet Take 1 tablet (5 mg total) by mouth 2 (two) times daily as  needed for muscle spasms.   diphenhydrAMINE  (BENADRYL ) 50 MG capsule Take one capsule 1 hour prior to scan.   Iron-Vitamins (GERITOL COMPLETE PO) Take 1 tablet by mouth daily.   megestrol  (MEGACE ) 40 MG/ML suspension take 10ml BY MOUTH EVERY DAY   metoprolol  tartrate (LOPRESSOR ) 25 MG tablet Take 1 tablet (25 mg total) by mouth 2 (two) times daily.   mirtazapine  (REMERON ) 7.5 MG tablet Take 1 tablet (7.5 mg total) by mouth at bedtime.   predniSONE  (DELTASONE ) 50 MG tablet Take one tablet 13 hours, 7 hours, and 1 hour prior to scan.   TRELEGY ELLIPTA  100-62.5-25 MCG/ACT AEPB INHALE ONE PUFF into THE lungs EVERY DAY   No facility-administered encounter medications on file as of 12/16/2023.    Allergies (verified) Ivp dye [iodinated contrast media]   History: Past Medical History:  Diagnosis Date   Allergy    Bone spur 04/2021   spine   COPD (chronic obstructive pulmonary disease) (HCC)    per pt told it was seen on an xray from an ER visit, not being treated   Heart murmur 2024   Normal Valves on Echo --likely related to aortic sclerosis.   Hypertension    Sciatica 04/2021   Scoliosis 04/2021   Past Surgical History:  Procedure Laterality Date   TRANSTHORACIC ECHOCARDIOGRAM  07/09/2022   Normal LV Fxn - LVEF 65-70%.  No RWMA. Mild Concentric  LVH. Gr 1 DD - normal for age.  Normal Strain. Normal RV & RVP/RAP. Normal MV & AoV (~aortic sclerosis)   UPPER GASTROINTESTINAL ENDOSCOPY     Family History  Problem Relation Age of Onset   Alcohol abuse Mother    Asthma Mother    Hyperlipidemia Mother    Alcohol abuse Father    Heart attack Father    Depression Sister    Hypertension Sister    Colon polyps Sister    Drug abuse Brother    Alcohol abuse Brother    Colon cancer Brother    Esophageal cancer Neg Hx    Stomach cancer Neg Hx    Rectal cancer Neg Hx    Breast cancer Neg Hx    Social History   Socioeconomic History   Marital status: Single    Spouse name: Not on  file   Number of children: 0   Years of education: 11th   Highest education level: Not on file  Occupational History   Not on file  Tobacco Use   Smoking status: Every Day    Current packs/day: 0.50    Average packs/day: 0.3 packs/day for 51.6 years (13.3 ttl pk-yrs)    Types: Cigarettes    Start date: 2024   Smokeless tobacco: Never   Tobacco comments:    Pt states she smokes 10 cigs a day. AB, Virginia Watson 10-28-23        Currently smoking 0.5 ppd    1-800-QUIT-NOW (1-518-470-3775).    [email or web address]                1-800-QUIT-NOW 785-732-8112).    [email or web address]      Vaping Use   Vaping status: Never Used  Substance and Sexual Activity   Alcohol use: Yes    Alcohol/week: 1.0 standard drink of alcohol    Types: 1 Glasses of wine per week    Comment: on holidays   Drug use: No   Sexual activity: Not Currently  Other Topics Concern   Not on file  Social History Narrative   Not on file   Social Drivers of Health   Financial Resource Strain: Low Risk  (12/16/2023)   Overall Financial Resource Strain (CARDIA)    Difficulty of Paying Living Expenses: Not hard at all  Food Insecurity: No Food Insecurity (12/16/2023)   Hunger Vital Sign    Worried About Running Out of Food in the Last Year: Never true    Ran Out of Food in the Last Year: Never true  Transportation Needs: No Transportation Needs (12/16/2023)   PRAPARE - Administrator, Civil Service (Medical): No    Lack of Transportation (Non-Medical): No  Physical Activity: Insufficiently Active (12/16/2023)   Exercise Vital Sign    Days of Exercise per Week: 7 days    Minutes of Exercise per Session: 20 min  Stress: No Stress Concern Present (12/16/2023)   [employer] of Occupational Health - Occupational Stress Questionnaire    Feeling of Stress: Not at all  Social Connections: Socially Isolated (12/16/2023)   Social Connection and Isolation Panel    Frequency of Communication  with Friends and Family: More than three times a week    Frequency of Social Gatherings with Friends and Family: More than three times a week    Attends Religious Services: Never    Database administrator or Organizations: No    Attends Banker Meetings: Never    Marital Status: Never married  Tobacco Counseling Ready to quit: No Counseling given: No Tobacco comments: Pt states she smokes 10 cigs a day. AB, Virginia Watson 10-28-23  Currently smoking 0.5 ppd 1-800-QUIT-NOW (1-(732)569-8581). [email or web address]    1-800-QUIT-NOW (818)128-5336). [email or web address]     Clinical Intake:  Pre-visit preparation completed: Yes  Pain : No/denies pain     BMI - recorded: 23.5 Nutritional Status: BMI of 19-24  Normal Nutritional Risks: None Diabetes: No  Lab Results  Component Value Date   HGBA1C 5.9 05/29/2023   HGBA1C 6.0 (H) 05/21/2022   HGBA1C 5.9 (H) 12/10/2019     How often do you need to have someone help you when you read instructions, pamphlets, or other written materials from your doctor or pharmacy?: 1 - Never  Interpreter Needed?: No  Information entered by :: Virginia Watson, Virginia Watson   Activities of Daily Living     12/16/2023    2:18 PM  In your present state of health, do you have any difficulty performing the following activities:  Hearing? 0  Vision? 0  Difficulty concentrating or making decisions? 0  Walking or climbing stairs? 0  Dressing or bathing? 0  Doing errands, shopping? 0  Preparing Food and eating ? N  Using the Toilet? N  In the past six months, have you accidently leaked urine? Y  Comment wears a pantyliner  Do you have problems with loss of bowel control? N  Managing your Medications? N  Managing your Finances? N  Housekeeping or managing your Housekeeping? N    Patient Care Team: Elnor Lauraine BRAVO, NP as PCP - General (Nurse Practitioner) Anner Alm ORN, MD as PCP - Cardiology (Cardiology) Sheree Penne Bruckner, MD as Consulting Physician (Vascular Surgery)  I have updated your Care Teams any recent Medical Services you may have received from other providers in the past year.     Assessment:   This is a routine wellness examination for Virginia Watson.  Hearing/Vision screen Hearing Screening - Comments:: Denies hearing difficulties   Vision Screening - Comments:: Denies vision concerns - plans to schedule an appt w/an Optometrist   Goals Addressed               This Visit's Progress     Patient Stated (pt-stated)        Patient stated she wants to do more walking       Depression Screen     12/16/2023    2:19 PM 10/07/2023    1:10 PM 08/01/2023   10:41 AM 05/29/2023    1:21 PM 06/28/2022    1:52 PM 05/03/2022    8:41 AM 03/28/2022    1:39 PM  PHQ 2/9 Scores  PHQ - 2 Score 0 0 0 0 0 0 0  PHQ- 9 Score 0    0 0     Fall Risk     12/16/2023    2:18 PM 10/28/2023    3:25 PM 10/07/2023    1:10 PM 08/01/2023   10:41 AM 05/29/2023    1:21 PM  Fall Risk   Falls in the past year? 0 0 0 0 0  Number falls in past yr: 0    0  Injury with Fall? 0  0  0  Risk for fall due to : No Fall Risks    No Fall Risks  Follow up Falls evaluation completed;Falls prevention discussed    Falls evaluation completed    MEDICARE RISK AT HOME:  Medicare Risk at Home Any stairs in or around the  home?: No If so, are there any without handrails?: No Home free of loose throw rugs in walkways, pet beds, electrical cords, etc?: Yes Adequate lighting in your home to reduce risk of falls?: Yes Life alert?: No Use of a cane, walker or w/c?: No Grab bars in the bathroom?: Yes Shower chair or bench in shower?: Yes Elevated toilet seat or a handicapped toilet?: No  TIMED UP AND GO:  Was the test performed?  No  Cognitive Function: 6CIT completed        12/16/2023    2:22 PM 03/28/2022    1:44 PM  6CIT Screen  What Year? 0 points 0 points  What month? 0 points 0 points  What time? 0 points 0 points   Count back from 20 0 points 0 points  Months in reverse 0 points 0 points  Repeat phrase 0 points 0 points  Total Score 0 points 0 points    Immunizations Immunization History  Administered Date(s) Administered   Fluad Quad(high Dose 65+) 03/21/2020, 03/01/2022   Fluad Trivalent(High Dose 65+) 05/29/2023   Influenza Split 03/01/2015   PFIZER(Purple Top)SARS-COV-2 Vaccination 07/04/2019, 08/11/2019   PNEUMOCOCCAL CONJUGATE-20 03/01/2022   Pneumococcal Polysaccharide-23 10/14/2017    Screening Tests Health Maintenance  Topic Date Due   DTaP/Tdap/Td (1 - Tdap) Never done   Zoster Vaccines- Shingrix (1 of 2) Never done   Colonoscopy  02/09/2023   INFLUENZA VACCINE  11/28/2023   Medicare Annual Wellness (AWV)  12/15/2024   Pneumococcal Vaccine: 50+ Years  Completed   DEXA SCAN  Completed   Hepatitis C Screening  Completed   HPV VACCINES  Aged Out   Meningococcal B Vaccine  Aged Out   Lung Cancer Screening  Discontinued   COVID-19 Vaccine  Discontinued    Health Maintenance  Health Maintenance Due  Topic Date Due   DTaP/Tdap/Td (1 - Tdap) Never done   Zoster Vaccines- Shingrix (1 of 2) Never done   Colonoscopy  02/09/2023   INFLUENZA VACCINE  11/28/2023   Health Maintenance Items Addressed:  Referral sent to GI for colonoscopy w/[facility]  Additional Screening:  Vision Screening: Recommended annual ophthalmology exams for early detection of glaucoma and other disorders of the eye. Would you like a referral to an eye doctor? No    Dental Screening: Recommended annual dental exams for proper oral hygiene  Community Resource Referral / Chronic Care Management: CRR required this visit?  No   CCM required this visit?  No   Plan:    I have personally reviewed and noted the following in the patient's chart:   Medical and social history Use of alcohol, tobacco or illicit drugs  Current medications and supplements including opioid prescriptions. Patient is not  currently taking opioid prescriptions. Functional ability and status Nutritional status Physical activity Advanced directives List of other physicians Hospitalizations, surgeries, and ER visits in previous 12 months Vitals Screenings to include cognitive, depression, and falls Referrals and appointments  In addition, I have reviewed and discussed with patient certain preventive protocols, quality metrics, and best practice recommendations. A written personalized care plan for preventive services as well as general preventive health recommendations were provided to patient.   Virginia CHRISTELLA Watson, Virginia Watson   12/16/2023   After Visit Summary: (In Person-Declined) Patient declined AVS at this time.  Notes: Nothing significant to report at this time.

## 2023-12-24 ENCOUNTER — Other Ambulatory Visit: Payer: Self-pay

## 2023-12-24 ENCOUNTER — Ambulatory Visit (HOSPITAL_COMMUNITY)
Admission: RE | Admit: 2023-12-24 | Discharge: 2023-12-24 | Disposition: A | Discharge status: Home / Self Care | Source: Ambulatory Visit | Attending: Vascular Surgery | Admitting: Vascular Surgery

## 2023-12-24 ENCOUNTER — Encounter (HOSPITAL_COMMUNITY): Payer: Self-pay

## 2023-12-24 DIAGNOSIS — I71012 Dissection of descending thoracic aorta: Secondary | ICD-10-CM

## 2023-12-24 DIAGNOSIS — T508X5S Adverse effect of diagnostic agents, sequela: Secondary | ICD-10-CM

## 2023-12-24 MED ORDER — IOHEXOL 350 MG/ML SOLN: 75.0000 mL | Freq: Once | INTRAVENOUS | Status: DC | PRN

## 2023-12-24 MED ORDER — DIPHENHYDRAMINE HCL 50 MG PO CAPS
ORAL_CAPSULE | ORAL | 0 refills | Status: AC
Start: 2023-12-24 — End: ?

## 2023-12-24 MED ORDER — PREDNISONE 50 MG PO TABS
ORAL_TABLET | ORAL | 0 refills | Status: AC
Start: 2023-12-24 — End: ?

## 2023-12-31 ENCOUNTER — Other Ambulatory Visit: Payer: Self-pay | Admitting: Nurse Practitioner

## 2023-12-31 DIAGNOSIS — I10 Essential (primary) hypertension: Secondary | ICD-10-CM

## 2024-01-01 ENCOUNTER — Ambulatory Visit (HOSPITAL_COMMUNITY)

## 2024-01-02 ENCOUNTER — Ambulatory Visit (HOSPITAL_COMMUNITY)
Admission: RE | Admit: 2024-01-02 | Discharge: 2024-01-02 | Disposition: A | Discharge status: Home / Self Care | Source: Ambulatory Visit | Attending: Vascular Surgery | Admitting: Vascular Surgery

## 2024-01-02 DIAGNOSIS — K5730 Diverticulosis of large intestine without perforation or abscess without bleeding: Secondary | ICD-10-CM | POA: Diagnosis not present

## 2024-01-02 DIAGNOSIS — I71012 Dissection of descending thoracic aorta: Secondary | ICD-10-CM | POA: Insufficient documentation

## 2024-01-02 DIAGNOSIS — K5750 Diverticulosis of both small and large intestine without perforation or abscess without bleeding: Secondary | ICD-10-CM | POA: Diagnosis not present

## 2024-01-02 DIAGNOSIS — I700 Atherosclerosis of aorta: Secondary | ICD-10-CM | POA: Diagnosis not present

## 2024-01-02 DIAGNOSIS — E041 Nontoxic single thyroid nodule: Secondary | ICD-10-CM | POA: Diagnosis not present

## 2024-01-02 MED ORDER — IOHEXOL 350 MG/ML SOLN
100.0000 mL | Freq: Once | INTRAVENOUS | Status: AC | PRN
  Administered 2024-01-02: 100 mL via INTRAVENOUS

## 2024-01-07 ENCOUNTER — Ambulatory Visit: Attending: Vascular Surgery | Admitting: Vascular Surgery

## 2024-01-07 ENCOUNTER — Encounter: Payer: Self-pay | Admitting: Vascular Surgery

## 2024-01-07 VITALS — BP 141/89 | HR 46 | Temp 97.9°F | Ht 66.0 in | Wt 146.0 lb

## 2024-01-07 DIAGNOSIS — I71012 Dissection of descending thoracic aorta: Secondary | ICD-10-CM

## 2024-01-07 NOTE — Progress Notes (Signed)
 Patient ID: Virginia Watson, female   DOB: 05/21/1945, 78 y.o.   MRN: [id number]  Reason for Consult: Follow-up   Referred by Elnor Lauraine BRAVO, NP  Subjective:     HPI:  Virginia Watson is a 78 y.o. female without previous history of vascular disease was recently evaluated in August for shortness of breath.  She is a longtime smoker does have COPD but does not require oxygen .  Findings on CT were concerning for acute aortic dissection but she denies any new abdominal or back pain.  She does have a stable chronic back pain.  Currently smoking 12 to 13 cigarettes/day.  No personal or family history of aneurysm disease.  Past Medical History:  Diagnosis Date   Allergy    Bone spur 04/2021   spine   COPD (chronic obstructive pulmonary disease) (HCC)    per pt told it was seen on an xray from an ER visit, not being treated   Heart murmur 2024   Normal Valves on Echo --likely related to aortic sclerosis.   Hypertension    Sciatica 04/2021   Scoliosis 04/2021   Family History  Problem Relation Age of Onset   Alcohol abuse Mother    Asthma Mother    Hyperlipidemia Mother    Alcohol abuse Father    Heart attack Father    Depression Sister    Hypertension Sister    Colon polyps Sister    Drug abuse Brother    Alcohol abuse Brother    Colon cancer Brother    Esophageal cancer Neg Hx    Stomach cancer Neg Hx    Rectal cancer Neg Hx    Breast cancer Neg Hx    Past Surgical History:  Procedure Laterality Date   TRANSTHORACIC ECHOCARDIOGRAM  07/09/2022   Normal LV Fxn - LVEF 65-70%.  No RWMA. Mild Concentric LVH. Gr 1 DD - normal for age.  Normal Strain. Normal RV & RVP/RAP. Normal MV & AoV (~aortic sclerosis)   UPPER GASTROINTESTINAL ENDOSCOPY      Short Social History:  Social History   Tobacco Use   Smoking status: Every Day    Current packs/day: 0.50    Average packs/day: 0.3 packs/day for 51.7 years (13.3 ttl pk-yrs)    Types: Cigarettes    Start date:  2024   Smokeless tobacco: Never   Tobacco comments:    Pt states she smokes 10 cigs a day. AB, CMA 10-28-23        Currently smoking 0.5 ppd    1-800-QUIT-NOW (1-612-129-9921).    [email or web address]                1-800-QUIT-NOW 6018782832).    [email or web address]      Substance Use Topics   Alcohol use: Yes    Alcohol/week: 1.0 standard drink of alcohol    Types: 1 Glasses of wine per week    Comment: on holidays    Allergies  Allergen Reactions   Ivp Dye [Iodinated Contrast Media]     Current Outpatient Medications  Medication Sig Dispense Refill   albuterol  (VENTOLIN  HFA) 108 (90 Base) MCG/ACT inhaler INHALE TWO puffs into THE lungs EVERY SIX HOURS AS NEEDED 6.7 g 3   amLODipine  (NORVASC ) 5 MG tablet Take 1 tablet (5 mg total) by mouth daily. 90 tablet 1   cholecalciferol (VITAMIN D3) 25 MCG (1000 UNIT) tablet Take 1,000 Units by mouth daily.     cyclobenzaprine  (FLEXERIL ) 5 MG tablet Take 1 tablet (5 mg  total) by mouth 2 (two) times daily as needed for muscle spasms. 30 tablet 1   diphenhydrAMINE  (BENADRYL ) 50 MG capsule Take one capsule 1 hour prior to scan. 1 capsule 0   Iron-Vitamins (GERITOL COMPLETE PO) Take 1 tablet by mouth daily.     megestrol  (MEGACE ) 40 MG/ML suspension take 10ml BY MOUTH EVERY DAY 240 mL 2   metoprolol  tartrate (LOPRESSOR ) 25 MG tablet Take 1 tablet (25 mg total) by mouth 2 (two) times daily. 180 tablet 3   mirtazapine  (REMERON ) 7.5 MG tablet Take 1 tablet (7.5 mg total) by mouth at bedtime. 90 tablet 0   predniSONE  (DELTASONE ) 50 MG tablet Take one tablet 13 hours, 7 hours, and 1 hour prior to scan. 3 tablet 0   TRELEGY ELLIPTA  100-62.5-25 MCG/ACT AEPB INHALE ONE PUFF into THE lungs EVERY DAY 60 each 3   No current facility-administered medications for this visit.    Review of Systems  Constitutional:  Constitutional negative. HENT: HENT negative.  Eyes: Eyes negative.  Respiratory: Positive for shortness of breath.   Cardiovascular: Positive for dyspnea with exertion.  GI: Gastrointestinal negative.  Musculoskeletal: Positive for back pain.  Neurological: Neurological negative. Hematologic: Hematologic/lymphatic negative.  Psychiatric: Psychiatric negative.        Objective:  Objective   Vitals:   01/07/24 1539  BP: (!) 141/89  Pulse: (!) 46  Temp: 97.9 F (36.6 C)  SpO2: (!) 88%  Weight: 146 lb (66.2 kg)  Height: 5' 6 (1.676 m)   Body mass index is 23.57 kg/m.  Physical Exam HENT:     Head: Normocephalic.     Nose: Nose normal.  Eyes:     Pupils: Pupils are equal, round, and reactive to light.  Cardiovascular:     Rate and Rhythm: Normal rate.     Pulses:          Femoral pulses are 2+ on the right side and 2+ on the left side.      Popliteal pulses are 2+ on the right side and 2+ on the left side.  Pulmonary:     Effort: Pulmonary effort is normal.  Abdominal:     General: Abdomen is flat.  Musculoskeletal:     Right lower leg: No edema.     Left lower leg: No edema.  Skin:    General: Skin is warm.     Capillary Refill: Capillary refill takes less than 2 seconds.  Neurological:     General: No focal deficit present.     Mental Status: She is alert.  Psychiatric:        Mood and Affect: Mood normal.     Data: We reviewed CT angio together compared to most recent CT for pulmonary embolus as well as 2 previous CT scans.  Most recent CT scan formal read pending     Assessment/Plan:    78 year old female with what appears to be asymptomatic dissection of the descending thoracic aorta extending into the proximal perivisceral aorta with normal-appearing infrarenal aorta and iliac and common femoral arteries as well as proximal descending thoracic aorta.  The formal read from the CT is pending.  We discussed the need for total cessation of tobacco products and control of blood pressure.  She will follow-up in another 6 months with repeat CT angio.  We discussed the signs  and symptoms of symptomatic aortic pathologies at this risk understanding.     Penne Lonni Colorado MD [facility]

## 2024-01-08 ENCOUNTER — Other Ambulatory Visit: Payer: Self-pay

## 2024-01-15 ENCOUNTER — Ambulatory Visit: Admitting: Nurse Practitioner

## 2024-01-15 VITALS — BP 154/86 | HR 63 | Temp 97.7°F | Ht 66.0 in | Wt 146.0 lb

## 2024-01-15 DIAGNOSIS — I71012 Dissection of descending thoracic aorta: Secondary | ICD-10-CM

## 2024-01-15 DIAGNOSIS — I10 Essential (primary) hypertension: Secondary | ICD-10-CM | POA: Diagnosis not present

## 2024-01-15 DIAGNOSIS — R319 Hematuria, unspecified: Secondary | ICD-10-CM

## 2024-01-15 DIAGNOSIS — R300 Dysuria: Secondary | ICD-10-CM | POA: Diagnosis not present

## 2024-01-15 DIAGNOSIS — Z716 Tobacco abuse counseling: Secondary | ICD-10-CM | POA: Diagnosis not present

## 2024-01-15 LAB — BASIC METABOLIC PANEL WITH GFR
BUN: 10 mg/dL (ref 6–23)
CO2: 26 meq/L (ref 19–32)
Calcium: 10.8 mg/dL — ABNORMAL HIGH (ref 8.4–10.5)
Chloride: 107 meq/L (ref 96–112)
Creatinine, Ser: 1.04 mg/dL (ref 0.40–1.20)
GFR: 52.46 mL/min — ABNORMAL LOW (ref >60.00)
Glucose, Bld: 83 mg/dL (ref 70–99)
Potassium: 3.6 meq/L (ref 3.5–5.1)
Sodium: 140 meq/L (ref 135–145)

## 2024-01-15 LAB — URINALYSIS, ROUTINE W REFLEX MICROSCOPIC
Bilirubin Urine: NEGATIVE
Hgb urine dipstick: NEGATIVE
Ketones, ur: NEGATIVE
Nitrite: NEGATIVE
RBC / HPF: NONE SEEN (ref 0–?)
Specific Gravity, Urine: <=1.005 — AB (ref 1.000–1.030)
Total Protein, Urine: NEGATIVE
Urine Glucose: NEGATIVE
Urobilinogen, UA: 0.2 (ref 0.0–1.0)
pH: 6 (ref 5.0–8.0)

## 2024-01-15 MED ORDER — VARENICLINE TARTRATE 0.5 MG PO TABS: ORAL_TABLET | ORAL | 3 refills | Status: DC

## 2024-01-15 MED ORDER — AMLODIPINE BESYLATE 10 MG PO TABS: 10.0000 mg | ORAL_TABLET | Freq: Every day | ORAL | 1 refills | Status: AC

## 2024-01-15 NOTE — Patient Instructions (Signed)
 Chantix  -- choose your quit date. Start the chantix  about 1-2 weeks before your quit date. STart by taking 1 tablet by mouth once a day for days 1-3, if you are tolerating it well you can then increased to 1 tablet by mouth twice a day.

## 2024-01-15 NOTE — Progress Notes (Signed)
 Established Patient Office Visit  Subjective   Patient ID: Virginia Watson, female    DOB: [birth year]  Age: 78 y.o. MRN: [id number]  Chief Complaint  Patient presents with   Hypertension    Discussed the use of AI scribe software for clinical note transcription with the patient, who gave verbal consent to proceed.  History of Present Illness Virginia Watson is a 78 year old female with aortic dissection and hypertension who presents with urinary symptoms and smoking cessation support.  Genitourinary symptoms - Persistent external burning sensation - Initial suspicion of yeast infection - Partial relief with triple antibiotic ointment - No vaginal discharge, fever, nausea, vomiting, or diarrhea - History of hematuria - Awaiting further evaluation by urology  Smoking cessation - Active tobacco use since age 89 (45 years) - Desires to quit smoking - Previous trial of nicotine  patches without success  Aortic dissection and hypertension - History of aortic dissection diagnosed by CT with vein and vascular. She is asymptomatic and per recent note treatment decision is to have patient monitor her self for symptoms and then repeat CT scan in 6 months.  - No surgical repair performed - Elevated blood pressure - Current antihypertensive regimen: amlodipine  5 mg once daily (previously increased from 2.5 mg), metoprolol  25 mg twice daily      Review of Systems  Eyes:  Negative for blurred vision.  Respiratory:  Negative for shortness of breath.   Cardiovascular:  Negative for chest pain.  Neurological:  Negative for headaches.      Objective:     BP (!) 154/86   Pulse 63   Temp 97.7 F (36.5 C) (Temporal)   Ht 5' 6 (1.676 m)   Wt 146 lb (66.2 kg)   BMI 23.57 kg/m  BP Readings from Last 3 Encounters:  01/15/24 (!) 154/86  01/07/24 (!) 141/89  12/16/23 128/80   Wt Readings from Last 3 Encounters:  01/15/24 146 lb (66.2 kg)  01/07/24 146 lb  (66.2 kg)  12/16/23 145 lb 9.6 oz (66 kg)      Physical Exam Vitals reviewed.  Constitutional:      General: She is not in acute distress.    Appearance: Normal appearance.  HENT:     Head: Normocephalic and atraumatic.  Neck:     Vascular: No carotid bruit.  Cardiovascular:     Rate and Rhythm: Normal rate and regular rhythm.     Pulses: Normal pulses.     Heart sounds: Normal heart sounds.  Pulmonary:     Effort: Pulmonary effort is normal.     Breath sounds: Normal breath sounds.  Skin:    General: Skin is warm and dry.  Neurological:     General: No focal deficit present.     Mental Status: She is alert and oriented to person, place, and time.  Psychiatric:        Mood and Affect: Mood normal.        Behavior: Behavior normal.        Judgment: Judgment normal.      No results found for any visits on 01/15/24.    The 10-year ASCVD risk score (Arnett DK, et al., 2019) is: 22.5%    Assessment & Plan:   Problem List Items Addressed This Visit       Cardiovascular and Mediastinum   Essential hypertension (Chronic)   Essential hypertension, poorly controlled Blood pressure poorly controlled at 154/86 mmHg. Better control needed due to aortic dissection. -  Increase amlodipine  to 10 mg daily. - Continue metoprolol  25 mg twice daily. - Instruct to monitor blood pressure at home daily, aiming for less than 130/80 mmHg. - Schedule follow-up in one to two weeks to monitor blood pressure control.      Relevant Medications   amLODipine  (NORVASC ) 10 MG tablet   Other Relevant Orders   Basic metabolic panel with GFR   Dissection of descending thoracic aorta (HCC)   Aortic dissection, stable, under surveillance Blood pressure control crucial to prevent worsening of dissection. Discussed risks of internal bleeding. - Emphasize importance of blood pressure control to prevent complications related to aortic dissection. -Emphasized need to quit smoking      Relevant  Medications   amLODipine  (NORVASC ) 10 MG tablet     Other   Hematuria   Hematuria, under evaluation Cause of hematuria is under evaluation. - Refer to a urologist for further evaluation of hematuria.      Relevant Orders   Ambulatory referral to Urology   Basic metabolic panel with GFR   Dysuria - Primary   External genital burning and pruritus, possible vulvovaginitis Differential includes yeast infection, bacterial vaginosis, or UTI. POC U/A showed no UTI. - Provide vaginal swab for self-collection to test for yeast infection or bacterial vaginosis. - Continue using triple AD ointment if it provides relief. -Further recommendations to be made based on swab results      Relevant Orders   Urinalysis, Routine w reflex microscopic   Urine Culture   NuSwab Vaginitis Plus (VG+)   Basic metabolic panel with GFR   Encounter for smoking cessation counseling   Nicotine  dependence, tobacco use disorder Long history of smoking, desires to quit. Interested in Chantix . Discussed potential side effects. - Prescribe Chantix  with instructions to start one week before quit date, taking one tablet daily for three days, then one tablet twice daily. - Discuss potential side effects of Chantix , including vivid dreams and suicidal thoughts, and advise to report any concerning symptoms. - Provide written instructions for Chantix  use. -Also advised use of nicotine  patches       Relevant Medications   varenicline  (CHANTIX ) 0.5 MG tablet   Other Relevant Orders   Basic metabolic panel with GFR  Assessment and Plan Assessment & Plan External genital burning and pruritus, possible vulvovaginitis Differential includes yeast infection, bacterial vaginosis, or UTI. POC U/A showed no UTI. - Provide vaginal swab for self-collection to test for yeast infection or bacterial vaginosis. - Continue using triple AD ointment if it provides relief. -Further recommendations to be made based on swab  results  Hematuria, under evaluation Cause of hematuria is under evaluation. - Refer to a urologist for further evaluation of hematuria.  Essential hypertension, poorly controlled Blood pressure poorly controlled at 154/86 mmHg. Better control needed due to aortic dissection. - Increase amlodipine  to 10 mg daily. - Continue metoprolol  25 mg twice daily. - Instruct to monitor blood pressure at home daily, aiming for less than 130/80 mmHg. - Schedule follow-up in one to two weeks to monitor blood pressure control.  Aortic dissection, stable, under surveillance Blood pressure control crucial to prevent worsening of dissection. Discussed risks of internal bleeding. - Emphasize importance of blood pressure control to prevent complications related to aortic dissection. -Emphasized need to quit smoking  Nicotine  dependence, tobacco use disorder Long history of smoking, desires to quit. Interested in Chantix . Discussed potential side effects. - Prescribe Chantix  with instructions to start one week before quit date, taking one tablet daily for  three days, then one tablet twice daily. - Discuss potential side effects of Chantix , including vivid dreams and suicidal thoughts, and advise to report any concerning symptoms. - Provide written instructions for Chantix  use. -Also advised use of nicotine  patches   Chronic kidney disease, stage 3b Creatinine clearance is 34, indicating stage 3b chronic kidney disease. - Monitor kidney function regularly.  I personally spent a total of 40 minutes in the care of the patient today including preparing to see the patient, getting/reviewing separately obtained history, performing a medically appropriate exam/evaluation, counseling and educating, placing orders, referring and communicating with other health care professionals, documenting clinical information in the EHR, and communicating results.   Return in about 2 weeks (around 01/29/2024) for F/U with Lauraine.     Lauraine FORBES Pereyra, NP

## 2024-01-15 NOTE — Assessment & Plan Note (Signed)
 Hematuria, under evaluation Cause of hematuria is under evaluation. - Refer to a urologist for further evaluation of hematuria.

## 2024-01-15 NOTE — Assessment & Plan Note (Signed)
 Essential hypertension, poorly controlled Blood pressure poorly controlled at 154/86 mmHg. Better control needed due to aortic dissection. - Increase amlodipine  to 10 mg daily. - Continue metoprolol  25 mg twice daily. - Instruct to monitor blood pressure at home daily, aiming for less than 130/80 mmHg. - Schedule follow-up in one to two weeks to monitor blood pressure control.

## 2024-01-15 NOTE — Assessment & Plan Note (Signed)
 Aortic dissection, stable, under surveillance Blood pressure control crucial to prevent worsening of dissection. Discussed risks of internal bleeding. - Emphasize importance of blood pressure control to prevent complications related to aortic dissection. -Emphasized need to quit smoking

## 2024-01-15 NOTE — Assessment & Plan Note (Signed)
 External genital burning and pruritus, possible vulvovaginitis Differential includes yeast infection, bacterial vaginosis, or UTI. POC U/A showed no UTI. - Provide vaginal swab for self-collection to test for yeast infection or bacterial vaginosis. - Continue using triple AD ointment if it provides relief. -Further recommendations to be made based on swab results

## 2024-01-15 NOTE — Assessment & Plan Note (Signed)
 Nicotine  dependence, tobacco use disorder Long history of smoking, desires to quit. Interested in Chantix . Discussed potential side effects. - Prescribe Chantix  with instructions to start one week before quit date, taking one tablet daily for three days, then one tablet twice daily. - Discuss potential side effects of Chantix , including vivid dreams and suicidal thoughts, and advise to report any concerning symptoms. - Provide written instructions for Chantix  use. -Also advised use of nicotine  patches

## 2024-01-16 LAB — URINE CULTURE

## 2024-01-18 LAB — NUSWAB VAGINITIS PLUS (VG+)
Candida albicans, NAA: NEGATIVE
Candida glabrata, NAA: NEGATIVE
Chlamydia trachomatis, NAA: NEGATIVE
Neisseria gonorrhoeae, NAA: NEGATIVE
Trich vag by NAA: POSITIVE — AB

## 2024-01-19 ENCOUNTER — Other Ambulatory Visit: Payer: Self-pay | Admitting: Nurse Practitioner

## 2024-01-19 ENCOUNTER — Ambulatory Visit: Payer: Self-pay | Admitting: Nurse Practitioner

## 2024-01-19 ENCOUNTER — Other Ambulatory Visit: Payer: Self-pay | Admitting: Family

## 2024-01-19 DIAGNOSIS — G4700 Insomnia, unspecified: Secondary | ICD-10-CM

## 2024-01-19 DIAGNOSIS — R634 Abnormal weight loss: Secondary | ICD-10-CM

## 2024-01-19 DIAGNOSIS — A599 Trichomoniasis, unspecified: Secondary | ICD-10-CM

## 2024-01-19 MED ORDER — METRONIDAZOLE 500 MG PO TABS: 500.0000 mg | ORAL_TABLET | Freq: Two times a day (BID) | ORAL | 0 refills | Status: AC

## 2024-01-19 NOTE — Progress Notes (Signed)
 Estimated Creatinine Clearance: 43.8 mL/min (by C-G formula based on SCr of 1.04 mg/dL).

## 2024-01-27 ENCOUNTER — Telehealth: Payer: Self-pay | Admitting: Radiology

## 2024-01-27 NOTE — Telephone Encounter (Signed)
 Copied from CRM [id number]. Topic: Clinical - Lab/Test Results >> Jan 27, 2024 10:45 AM Franky GRADE wrote: Reason for CRM: Patient is returning a call she received from [place], I advised of the lab results message that was left. Patient stated she completed the cycle for metronidazole  500mg  tablets as she picked up the medication about a week ago.

## 2024-02-20 DIAGNOSIS — G4733 Obstructive sleep apnea (adult) (pediatric): Secondary | ICD-10-CM | POA: Diagnosis not present

## 2024-02-23 ENCOUNTER — Telehealth: Payer: Self-pay

## 2024-02-23 NOTE — Telephone Encounter (Signed)
 Copied from CRM [id number]. Topic: Referral - Question >> Feb 23, 2024 11:45 AM Eva FALCON wrote: Reason for CRM: Pt was calling in and states she has been getting calls for Gastroenterologist but is unsure why when her and Dr. Elnor discussed getting her in with the liver doctor. She is requesting a call back as she wouldn't give further information other than the referral for the liver doctor. Please call back [phone].

## 2024-02-25 NOTE — Telephone Encounter (Signed)
 Called pt and made her aware that referral was place and provided her the number to call to [facility] urology

## 2024-03-11 ENCOUNTER — Other Ambulatory Visit: Payer: Self-pay | Admitting: Nurse Practitioner

## 2024-03-11 ENCOUNTER — Other Ambulatory Visit: Payer: Self-pay | Admitting: Family

## 2024-03-11 DIAGNOSIS — J449 Chronic obstructive pulmonary disease, unspecified: Secondary | ICD-10-CM

## 2024-03-11 DIAGNOSIS — I10 Essential (primary) hypertension: Secondary | ICD-10-CM

## 2024-03-15 ENCOUNTER — Encounter: Payer: Self-pay | Admitting: Internal Medicine

## 2024-04-01 ENCOUNTER — Emergency Department (HOSPITAL_COMMUNITY)

## 2024-04-01 ENCOUNTER — Other Ambulatory Visit: Payer: Self-pay

## 2024-04-01 ENCOUNTER — Emergency Department (HOSPITAL_COMMUNITY): Admission: EM | Admit: 2024-04-01 | Discharge: 2024-04-02 | Disposition: A

## 2024-04-01 ENCOUNTER — Encounter (HOSPITAL_COMMUNITY): Payer: Self-pay

## 2024-04-01 DIAGNOSIS — J111 Influenza due to unidentified influenza virus with other respiratory manifestations: Secondary | ICD-10-CM

## 2024-04-01 LAB — RESP PANEL BY RT-PCR (RSV, FLU A&B, COVID)  RVPGX2
Influenza A by PCR: POSITIVE — AB
Influenza B by PCR: NEGATIVE
Resp Syncytial Virus by PCR: NEGATIVE
SARS Coronavirus 2 by RT PCR: NEGATIVE

## 2024-04-01 MED ORDER — PREDNISONE 20 MG PO TABS
60.0000 mg | ORAL_TABLET | Freq: Once | ORAL | Status: AC
  Administered 2024-04-01: 60 mg via ORAL
  Filled 2024-04-01: qty 3

## 2024-04-01 MED ORDER — BENZONATATE 100 MG PO CAPS
100.0000 mg | ORAL_CAPSULE | Freq: Once | ORAL | Status: AC
  Administered 2024-04-02: 100 mg via ORAL
  Filled 2024-04-01: qty 1

## 2024-04-01 MED ORDER — GUAIFENESIN 100 MG/5ML PO LIQD: 100.0000 mg | ORAL | 0 refills | Status: DC | PRN

## 2024-04-01 MED ORDER — IPRATROPIUM-ALBUTEROL 0.5-2.5 (3) MG/3ML IN SOLN
3.0000 mL | Freq: Once | RESPIRATORY_TRACT | Status: AC
  Administered 2024-04-01: 3 mL via RESPIRATORY_TRACT
  Filled 2024-04-01: qty 3

## 2024-04-01 NOTE — ED Provider Notes (Incomplete)
 [place] [facility] AT [facility] Provider Note   CSN: [id number] Arrival date & time: 04/01/24  1410     Patient presents with: Flu-Like Symptoms  HPI Virginia Watson is a 78 y.o. female with COPD, CKD, hypertension presenting for cough chills shortness of breath and bodyaches.  Has been going on for 5 days.  Cough is productive.  Shortness of breath is intermittent.  She states she is not short of breath at this time but has noticed when she is walking around the house that she has to stop at times to catch her breath.  Has been using her inhaler at times which does help.  Reports her nephew had similar symptoms at Thanksgiving.  Denies chest pain, calf tenderness or swelling.  {Add pertinent medical, surgical, social history, OB history to HPI:32947} HPI     Prior to Admission medications   Medication Sig Start Date End Date Taking? Authorizing Provider  albuterol  (VENTOLIN  HFA) 108 (90 Base) MCG/ACT inhaler INHALE TWO puffs into THE lungs EVERY SIX HOURS AS NEEDED 11/11/23   Webb, Padonda B, FNP  amLODipine  (NORVASC ) 10 MG tablet Take 1 tablet (10 mg total) by mouth daily. 01/15/24   Elnor Lauraine BRAVO, NP  cholecalciferol (VITAMIN D3) 25 MCG (1000 UNIT) tablet Take 1,000 Units by mouth daily.    [provider]  cyclobenzaprine  (FLEXERIL ) 5 MG tablet Take 1 tablet (5 mg total) by mouth 2 (two) times daily as needed for muscle spasms. 10/07/23   Alvia Corean CROME, FNP  diphenhydrAMINE  (BENADRYL ) 50 MG capsule Take one capsule 1 hour prior to scan. 12/24/23   Sheree Penne Bruckner, MD  guaiFENesin  (ROBITUSSIN) 100 MG/5ML liquid Take 5-10 mLs (100-200 mg total) by mouth every 4 (four) hours as needed for cough or to loosen phlegm. 04/01/24   Lang Norleen POUR, PA-C  Iron-Vitamins (GERITOL COMPLETE PO) Take 1 tablet by mouth daily.    [provider]  megestrol  (MEGACE ) 40 MG/ML suspension take 10ml BY MOUTH EVERY DAY 11/11/23   Webb, Padonda  B, FNP  metoprolol  tartrate (LOPRESSOR ) 25 MG tablet Take 1 tablet (25 mg total) by mouth 2 (two) times daily. 03/11/24   Elnor Lauraine BRAVO, NP  mirtazapine  (REMERON ) 7.5 MG tablet Take 1 tablet (7.5 mg total) by mouth at bedtime. 10/02/23   Webb, Padonda B, FNP  predniSONE  (DELTASONE ) 50 MG tablet Take one tablet 13 hours, 7 hours, and 1 hour prior to scan. 12/24/23   Sheree Penne Bruckner, MD  TRELEGY ELLIPTA  100-62.5-25 MCG/ACT AEPB INHALE ONE PUFF into THE lungs EVERY DAY 11/11/23   Webb, Padonda B, FNP  varenicline  (CHANTIX ) 0.5 MG tablet Take 1 tablet by mouth daily for days 1-3, then take 1 tablet by mouth twice a day 01/15/24   Elnor Lauraine BRAVO, NP    Allergies: Ivp dye [iodinated contrast media]    Review of Systems See HPI  Updated Vital Signs BP 115/82 (BP Location: Left Arm)   Pulse 81   Temp 97.6 F (36.4 C) (Oral)   Resp 20   Ht 5' 6 (1.676 m)   Wt 63.5 kg   SpO2 99%   BMI 22.60 kg/m   Physical Exam Vitals and nursing note reviewed.  HENT:     Head: Normocephalic and atraumatic.     Mouth/Throat:     Mouth: Mucous membranes are moist.  Eyes:     General:        Right eye: No discharge.  Left eye: No discharge.     Conjunctiva/sclera: Conjunctivae normal.  Cardiovascular:     Rate and Rhythm: Normal rate and regular rhythm.     Pulses: Normal pulses.     Heart sounds: Normal heart sounds.  Pulmonary:     Effort: Pulmonary effort is normal.     Breath sounds: Normal breath sounds and air entry. No decreased breath sounds, wheezing, rhonchi or rales.  Abdominal:     General: Abdomen is flat.     Palpations: Abdomen is soft.  Skin:    General: Skin is warm and dry.  Neurological:     General: No focal deficit present.  Psychiatric:        Mood and Affect: Mood normal.     (all labs ordered are listed, but only abnormal results are displayed) Labs Reviewed  RESP PANEL BY RT-PCR (RSV, FLU A&B, COVID)  RVPGX2 - Abnormal; Notable for the following  components:      Result Value   Influenza A by PCR POSITIVE (*)    All other components within normal limits    EKG: EKG Interpretation Date/Time:  Thursday April 01 2024 23:50:49 EST Ventricular Rate:  76 PR Interval:  174 QRS Duration:  74 QT Interval:  392 QTC Calculation: 441 R Axis:   72  Text Interpretation: Sinus rhythm with Premature atrial complexes Nonspecific ST and T wave abnormality Abnormal ECG When compared with ECG of 09-Oct-2023 18:11, PREVIOUS ECG IS PRESENT Confirmed by Lorette Mayo [phone]) on 04/02/2024 12:02:56 AM  Radiology: [facility] Chest 2 View Result Date: 04/01/2024 EXAM: 2 VIEW(S) XRAY OF THE CHEST 04/01/2024 11:41:00 PM COMPARISON: 12 / 26 / 23. CLINICAL HISTORY: cough FINDINGS: LUNGS AND PLEURA: Hyperinflated lungs and chronic bronchitic change suggesting emphysema. No focal pulmonary opacity. No pleural effusion. No pneumothorax. HEART AND MEDIASTINUM: Calcified and tortuous aorta. No acute abnormality of the cardiac silhouette. BONES AND SOFT TISSUES: Thoracic degenerative changes. No acute fracture. IMPRESSION: 1. No acute abnormality.  Emphysema. Electronically signed by: Norman Gatlin MD 04/01/2024 11:52 PM EST RP Workstation: [id number]    {Document cardiac monitor, telemetry assessment procedure when appropriate:32947} Procedures   Medications Ordered in the ED  benzonatate  (TESSALON ) capsule 100 mg (has no administration in time range)  predniSONE  (DELTASONE ) tablet 60 mg (60 mg Oral Given 04/01/24 2345)  ipratropium-albuterol  (DUONEB) 0.5-2.5 (3) MG/3ML nebulizer solution 3 mL (3 mLs Nebulization Given 04/01/24 2345)  ipratropium-albuterol  (DUONEB) 0.5-2.5 (3) MG/3ML nebulizer solution 3 mL (3 mLs Nebulization Given 04/01/24 2345)      {Click here for ABCD2, HEART and other calculators REFRESH Note before signing:1}                              Medical Decision Making Amount and/or Complexity of Data Reviewed Radiology:  ordered.  Risk Prescription drug management.   78 year old well-appearing female presenting for URI symptoms and intermittent shortness of breath.  Exam was unremarkable and she does not appear to be in respiratory distress.  Vitals here have remained normal throughout encounter.  Ambulated with pulse ox as well and tolerated without signs of respiratory distress.  Respiratory PCR showed that she was flu positive.  Chest x-ray was negative for acute process.  {Document critical care time when appropriate  Document review of labs and clinical decision tools ie CHADS2VASC2, etc  Document your independent review of radiology images and any outside records  Document your discussion with family members, caretakers and with  consultants  Document social determinants of health affecting pt's care  Document your decision making why or why not admission, treatments were needed:32947:::1}   Final diagnoses:  None    ED Discharge Orders          Ordered    guaiFENesin  (ROBITUSSIN) 100 MG/5ML liquid  Every 4 hours PRN,   Status:  Discontinued        04/01/24 2356    guaiFENesin  (ROBITUSSIN) 100 MG/5ML liquid  Every 4 hours PRN        04/01/24 2357

## 2024-04-01 NOTE — ED Provider Notes (Signed)
 [facility] Provider Note   CSN: [id number] Arrival date & time: 04/01/24  1410     Patient presents with: Flu-Like Symptoms  HPI Virginia Watson is a 78 y.o. female with COPD, CKD, hypertension presenting for cough chills shortness of breath and bodyaches.  Has been going on for 5 days.  Cough is productive.  Shortness of breath is intermittent.  She states she is not short of breath at this time but has noticed when she is walking around the house that she has to stop at times to catch her breath.  Has been using her inhaler at times which does help.  Reports her nephew had similar symptoms at Thanksgiving.  Denies chest pain, calf tenderness or swelling.   HPI     Prior to Admission medications   Medication Sig Start Date End Date Taking? Authorizing Provider  albuterol  (VENTOLIN  HFA) 108 (90 Base) MCG/ACT inhaler INHALE TWO puffs into THE lungs EVERY SIX HOURS AS NEEDED 11/11/23   Webb, Padonda B, FNP  amLODipine  (NORVASC ) 10 MG tablet Take 1 tablet (10 mg total) by mouth daily. 01/15/24   Elnor Lauraine BRAVO, NP  cholecalciferol (VITAMIN D3) 25 MCG (1000 UNIT) tablet Take 1,000 Units by mouth daily.    [provider]  cyclobenzaprine  (FLEXERIL ) 5 MG tablet Take 1 tablet (5 mg total) by mouth 2 (two) times daily as needed for muscle spasms. 10/07/23   Alvia Corean CROME, FNP  diphenhydrAMINE  (BENADRYL ) 50 MG capsule Take one capsule 1 hour prior to scan. 12/24/23   Sheree Penne Bruckner, MD  guaiFENesin  (ROBITUSSIN) 100 MG/5ML liquid Take 5-10 mLs (100-200 mg total) by mouth every 4 (four) hours as needed for cough or to loosen phlegm. 04/01/24   Lang Norleen POUR, PA-C  Iron-Vitamins (GERITOL COMPLETE PO) Take 1 tablet by mouth daily.    [provider]  megestrol  (MEGACE ) 40 MG/ML suspension take 10ml BY MOUTH EVERY DAY 11/11/23   Webb, Padonda B, FNP  metoprolol  tartrate (LOPRESSOR ) 25 MG tablet Take 1 tablet (25 mg  total) by mouth 2 (two) times daily. 03/11/24   Elnor Lauraine BRAVO, NP  mirtazapine  (REMERON ) 7.5 MG tablet Take 1 tablet (7.5 mg total) by mouth at bedtime. 10/02/23   Webb, Padonda B, FNP  predniSONE  (DELTASONE ) 50 MG tablet Take one tablet 13 hours, 7 hours, and 1 hour prior to scan. 12/24/23   Sheree Penne Bruckner, MD  TRELEGY ELLIPTA  100-62.5-25 MCG/ACT AEPB INHALE ONE PUFF into THE lungs EVERY DAY 11/11/23   Webb, Padonda B, FNP  varenicline  (CHANTIX ) 0.5 MG tablet Take 1 tablet by mouth daily for days 1-3, then take 1 tablet by mouth twice a day 01/15/24   Elnor Lauraine BRAVO, NP    Allergies: Ivp dye [iodinated contrast media]    Review of Systems See HPI  Updated Vital Signs BP 115/82 (BP Location: Left Arm)   Pulse 81   Temp 97.6 F (36.4 C) (Oral)   Resp 20   Ht 5' 6 (1.676 m)   Wt 63.5 kg   SpO2 99%   BMI 22.60 kg/m   Physical Exam Vitals and nursing note reviewed.  HENT:     Head: Normocephalic and atraumatic.     Mouth/Throat:     Mouth: Mucous membranes are moist.  Eyes:     General:        Right eye: No discharge.        Left eye: No discharge.  Conjunctiva/sclera: Conjunctivae normal.  Cardiovascular:     Rate and Rhythm: Normal rate and regular rhythm.     Pulses: Normal pulses.     Heart sounds: Normal heart sounds.  Pulmonary:     Effort: Pulmonary effort is normal.     Breath sounds: Normal breath sounds and air entry. No decreased breath sounds, wheezing, rhonchi or rales.  Abdominal:     General: Abdomen is flat.     Palpations: Abdomen is soft.  Skin:    General: Skin is warm and dry.  Neurological:     General: No focal deficit present.  Psychiatric:        Mood and Affect: Mood normal.     (all labs ordered are listed, but only abnormal results are displayed) Labs Reviewed  RESP PANEL BY RT-PCR (RSV, FLU A&B, COVID)  [id number] - Abnormal; Notable for the following components:      Result Value   Influenza A by PCR POSITIVE (*)    All other  components within normal limits    EKG: EKG Interpretation Date/Time:  Thursday April 01 2024 23:50:49 EST Ventricular Rate:  76 PR Interval:  174 QRS Duration:  74 QT Interval:  392 QTC Calculation: 441 R Axis:   72  Text Interpretation: Sinus rhythm with Premature atrial complexes Nonspecific ST and T wave abnormality Abnormal ECG When compared with ECG of 09-Oct-2023 18:11, PREVIOUS ECG IS PRESENT Confirmed by Lorette Mayo [phone]) on 04/02/2024 12:02:56 AM  Radiology: [facility] Chest 2 View Result Date: 04/01/2024 EXAM: 2 VIEW(S) XRAY OF THE CHEST 04/01/2024 11:41:00 PM COMPARISON: 12 / 26 / 23. CLINICAL HISTORY: cough FINDINGS: LUNGS AND PLEURA: Hyperinflated lungs and chronic bronchitic change suggesting emphysema. No focal pulmonary opacity. No pleural effusion. No pneumothorax. HEART AND MEDIASTINUM: Calcified and tortuous aorta. No acute abnormality of the cardiac silhouette. BONES AND SOFT TISSUES: Thoracic degenerative changes. No acute fracture. IMPRESSION: 1. No acute abnormality.  Emphysema. Electronically signed by: Norman Gatlin MD 04/01/2024 11:52 PM EST RP Workstation: [id number]     Procedures   Medications Ordered in the ED  benzonatate  (TESSALON ) capsule 100 mg (has no administration in time range)  predniSONE  (DELTASONE ) tablet 60 mg (60 mg Oral Given 04/01/24 2345)  ipratropium-albuterol  (DUONEB) 0.5-2.5 (3) MG/3ML nebulizer solution 3 mL (3 mLs Nebulization Given 04/01/24 2345)  ipratropium-albuterol  (DUONEB) 0.5-2.5 (3) MG/3ML nebulizer solution 3 mL (3 mLs Nebulization Given 04/01/24 2345)                                    Medical Decision Making Amount and/or Complexity of Data Reviewed Radiology: ordered.  Risk Prescription drug management.   78 year old well-appearing female presenting for URI symptoms and intermittent shortness of breath.  Exam was unremarkable and she does not appear to be in respiratory distress. Vitals here have remained normal  throughout encounter. Ambulated with pulse ox as well and tolerated without signs of respiratory distress.  Respiratory PCR showed that she was flu positive. Chest x-ray was negative for acute process.  EKG showing sinus rhythm.  She has not had chest pain throughout encounter as well.  Suspect the flu is likely the etiology of her symptoms.  Advised supportive care at home.  Consider Tamiflu but feel that it is not indicated given she has had 5 days of symptoms and is likely reached the peak of illness given how well she appears.  Advised her to follow-up with  her PCP.  Discussed return precautions.  Discharged good condition.  At her request sent cough medicine to her pharmacy.     Final diagnoses:  Flu    ED Discharge Orders          Ordered    guaiFENesin  (ROBITUSSIN) 100 MG/5ML liquid  Every 4 hours PRN,   Status:  Discontinued        04/01/24 2356    guaiFENesin  (ROBITUSSIN) 100 MG/5ML liquid  Every 4 hours PRN        04/01/24 2357               Shima Compere K, PA-C 04/02/24 0005    Neysa Caron PARAS, DO 04/10/24 [phone]

## 2024-04-01 NOTE — ED Triage Notes (Signed)
 Patient BIB [facility] from home for cough, chills, shob, body aches x 5 days after exposure on Thanksgiving. Denies fever, N/V/D.  BP 124/80 HR 72 R 18 96% RA CBG 115

## 2024-04-02 DIAGNOSIS — J101 Influenza due to other identified influenza virus with other respiratory manifestations: Secondary | ICD-10-CM | POA: Diagnosis not present

## 2024-04-02 MED ORDER — GUAIFENESIN 100 MG/5ML PO LIQD: 100.0000 mg | ORAL | 0 refills | Status: AC | PRN

## 2024-04-02 NOTE — Discharge Instructions (Addendum)
 Evaluation today revealed that you have the flu.  This is the likely cause of your symptoms.  Continue supportive care at home which mainly includes rest assertive hydration with water and Gatorade and you can take cough medicine as needed.  If you are short of breath, have uncontrolled fever, become lethargic or any other concerning symptom please return to the ED for further evaluation.  I sent cough medicine to your pharmacy as well.

## 2024-04-07 ENCOUNTER — Other Ambulatory Visit: Payer: Self-pay | Admitting: Nurse Practitioner

## 2024-04-07 DIAGNOSIS — Z716 Tobacco abuse counseling: Secondary | ICD-10-CM

## 2024-04-19 ENCOUNTER — Ambulatory Visit: Admitting: Primary Care

## 2024-05-21 ENCOUNTER — Telehealth: Payer: Self-pay

## 2024-05-21 NOTE — Telephone Encounter (Signed)
 Pt is scheduled to see Landry Ferrari, NP on Monday, 05-24-2024 for CPAP f/u. I could not find any data past 02-25-2024. I called [employer] and spoke to [facility]. Arvella stated he would look into this for me and either refresh [employer] for me or fax over an updated copy. NFN

## 2024-05-24 ENCOUNTER — Ambulatory Visit: Admitting: Primary Care

## 2024-05-28 ENCOUNTER — Other Ambulatory Visit: Payer: Self-pay | Admitting: *Deleted

## 2024-05-28 DIAGNOSIS — I71012 Dissection of descending thoracic aorta: Secondary | ICD-10-CM

## 2024-05-28 MED ORDER — DIPHENHYDRAMINE HCL 50 MG PO CAPS: ORAL_CAPSULE | ORAL | 0 refills | Status: AC

## 2024-05-28 MED ORDER — PREDNISONE 50 MG PO TABS: ORAL_TABLET | ORAL | 0 refills | Status: AC

## 2024-06-23 ENCOUNTER — Ambulatory Visit: Admitting: Primary Care

## 2024-06-29 ENCOUNTER — Other Ambulatory Visit (HOSPITAL_COMMUNITY)

## 2024-07-07 ENCOUNTER — Ambulatory Visit: Admitting: Vascular Surgery

## 2024-12-16 ENCOUNTER — Ambulatory Visit
